# Patient Record
Sex: Male | Born: 1970 | Race: White | Hispanic: No | Marital: Married | Smoking: Never smoker
Health system: Southern US, Community
[De-identification: ages and names within clinical notes are randomized; demographics above are authoritative.]

## PROBLEM LIST (undated history)

## (undated) DIAGNOSIS — S069X9A Unspecified intracranial injury with loss of consciousness of unspecified duration, initial encounter: Secondary | ICD-10-CM

## (undated) DIAGNOSIS — R61 Generalized hyperhidrosis: Secondary | ICD-10-CM

## (undated) DIAGNOSIS — R059 Cough, unspecified: Secondary | ICD-10-CM

## (undated) DIAGNOSIS — R630 Anorexia: Secondary | ICD-10-CM

## (undated) DIAGNOSIS — Q132 Other congenital malformations of iris: Secondary | ICD-10-CM

## (undated) DIAGNOSIS — J45909 Unspecified asthma, uncomplicated: Secondary | ICD-10-CM

## (undated) DIAGNOSIS — K219 Gastro-esophageal reflux disease without esophagitis: Secondary | ICD-10-CM

## (undated) DIAGNOSIS — I10 Essential (primary) hypertension: Secondary | ICD-10-CM

## (undated) DIAGNOSIS — K222 Esophageal obstruction: Secondary | ICD-10-CM

## (undated) DIAGNOSIS — M1990 Unspecified osteoarthritis, unspecified site: Secondary | ICD-10-CM

## (undated) DIAGNOSIS — R7303 Prediabetes: Secondary | ICD-10-CM

## (undated) DIAGNOSIS — M249 Joint derangement, unspecified: Secondary | ICD-10-CM

## (undated) DIAGNOSIS — R5383 Other fatigue: Secondary | ICD-10-CM

## (undated) DIAGNOSIS — G4730 Sleep apnea, unspecified: Secondary | ICD-10-CM

## (undated) DIAGNOSIS — Z972 Presence of dental prosthetic device (complete) (partial): Secondary | ICD-10-CM

## (undated) DIAGNOSIS — R0602 Shortness of breath: Secondary | ICD-10-CM

## (undated) DIAGNOSIS — R05 Cough: Secondary | ICD-10-CM

## (undated) DIAGNOSIS — E785 Hyperlipidemia, unspecified: Secondary | ICD-10-CM

## (undated) DIAGNOSIS — F329 Major depressive disorder, single episode, unspecified: Secondary | ICD-10-CM

## (undated) DIAGNOSIS — F419 Anxiety disorder, unspecified: Secondary | ICD-10-CM

## (undated) DIAGNOSIS — IMO0001 Reserved for inherently not codable concepts without codable children: Secondary | ICD-10-CM

## (undated) DIAGNOSIS — H532 Diplopia: Secondary | ICD-10-CM

## (undated) DIAGNOSIS — K449 Diaphragmatic hernia without obstruction or gangrene: Secondary | ICD-10-CM

## (undated) DIAGNOSIS — F32A Depression, unspecified: Secondary | ICD-10-CM

## (undated) DIAGNOSIS — T7840XA Allergy, unspecified, initial encounter: Secondary | ICD-10-CM

## (undated) DIAGNOSIS — Z5189 Encounter for other specified aftercare: Secondary | ICD-10-CM

## (undated) DIAGNOSIS — D689 Coagulation defect, unspecified: Secondary | ICD-10-CM

## (undated) DIAGNOSIS — M25569 Pain in unspecified knee: Secondary | ICD-10-CM

## (undated) HISTORY — DX: Encounter for other specified aftercare: Z51.89

## (undated) HISTORY — DX: Hyperlipidemia, unspecified: E78.5

## (undated) HISTORY — DX: Presence of dental prosthetic device (complete) (partial): Z97.2

## (undated) HISTORY — PX: ESOPHAGEAL DILATION: SHX303

## (undated) HISTORY — DX: Essential (primary) hypertension: I10

## (undated) HISTORY — DX: Gastro-esophageal reflux disease without esophagitis: K21.9

## (undated) HISTORY — PX: APPENDECTOMY: SHX54

## (undated) HISTORY — DX: Unspecified osteoarthritis, unspecified site: M19.90

## (undated) HISTORY — DX: Cough: R05

## (undated) HISTORY — DX: Allergy, unspecified, initial encounter: T78.40XA

## (undated) HISTORY — PX: EYE SURGERY: SHX253

## (undated) HISTORY — DX: Joint derangement, unspecified: M24.9

## (undated) HISTORY — DX: Esophageal obstruction: K22.2

## (undated) HISTORY — DX: Diplopia: H53.2

## (undated) HISTORY — PX: HERNIA REPAIR: SHX51

## (undated) HISTORY — DX: Pain in unspecified knee: M25.569

## (undated) HISTORY — DX: Depression, unspecified: F32.A

## (undated) HISTORY — PX: TOE SURGERY: SHX1073

## (undated) HISTORY — DX: Major depressive disorder, single episode, unspecified: F32.9

## (undated) HISTORY — PX: JOINT REPLACEMENT: SHX530

## (undated) HISTORY — DX: Anorexia: R63.0

## (undated) HISTORY — DX: Shortness of breath: R06.02

## (undated) HISTORY — DX: Other fatigue: R53.83

## (undated) HISTORY — DX: Coagulation defect, unspecified: D68.9

## (undated) HISTORY — DX: Unspecified intracranial injury with loss of consciousness of unspecified duration, initial encounter: S06.9X9A

## (undated) HISTORY — PX: UPPER GASTROINTESTINAL ENDOSCOPY: SHX188

## (undated) HISTORY — DX: Reserved for inherently not codable concepts without codable children: IMO0001

## (undated) HISTORY — DX: Generalized hyperhidrosis: R61

## (undated) HISTORY — DX: Unspecified asthma, uncomplicated: J45.909

## (undated) HISTORY — DX: Cough, unspecified: R05.9

## (undated) HISTORY — DX: Other congenital malformations of iris: Q13.2

## (undated) HISTORY — DX: Diaphragmatic hernia without obstruction or gangrene: K44.9

## (undated) HISTORY — PX: CHOLECYSTECTOMY: SHX55

---
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
## 2001-02-20 ENCOUNTER — Encounter: Payer: Self-pay | Admitting: Gastroenterology

## 2001-02-20 ENCOUNTER — Ambulatory Visit (HOSPITAL_COMMUNITY): Admission: RE | Admit: 2001-02-20 | Discharge: 2001-02-20 | Payer: Self-pay | Admitting: Gastroenterology

## 2001-02-28 ENCOUNTER — Encounter (INDEPENDENT_AMBULATORY_CARE_PROVIDER_SITE_OTHER): Payer: Self-pay | Admitting: Specialist

## 2001-02-28 ENCOUNTER — Ambulatory Visit (HOSPITAL_COMMUNITY): Admission: RE | Admit: 2001-02-28 | Discharge: 2001-02-28 | Payer: Self-pay | Admitting: Gastroenterology

## 2001-02-28 ENCOUNTER — Encounter: Payer: Self-pay | Admitting: Gastroenterology

## 2002-08-16 ENCOUNTER — Encounter: Admission: RE | Admit: 2002-08-16 | Discharge: 2002-08-16 | Payer: Self-pay | Admitting: Family Medicine

## 2002-08-16 ENCOUNTER — Encounter: Payer: Self-pay | Admitting: Family Medicine

## 2003-07-10 ENCOUNTER — Ambulatory Visit (HOSPITAL_COMMUNITY): Admission: RE | Admit: 2003-07-10 | Discharge: 2003-07-10 | Payer: Self-pay | Admitting: Nurse Practitioner

## 2006-04-29 ENCOUNTER — Emergency Department (HOSPITAL_COMMUNITY): Admission: EM | Admit: 2006-04-29 | Discharge: 2006-04-29 | Payer: Self-pay | Admitting: Emergency Medicine

## 2006-06-27 ENCOUNTER — Ambulatory Visit: Payer: Self-pay | Admitting: Gastroenterology

## 2006-07-06 ENCOUNTER — Ambulatory Visit: Payer: Self-pay | Admitting: Gastroenterology

## 2006-09-26 ENCOUNTER — Encounter: Admission: RE | Admit: 2006-09-26 | Discharge: 2006-09-26 | Payer: Self-pay | Admitting: Family Medicine

## 2007-05-23 ENCOUNTER — Encounter: Admission: RE | Admit: 2007-05-23 | Discharge: 2007-08-21 | Payer: Self-pay | Admitting: Orthopedic Surgery

## 2009-01-29 ENCOUNTER — Emergency Department (HOSPITAL_COMMUNITY): Admission: EM | Admit: 2009-01-29 | Discharge: 2009-01-30 | Payer: Self-pay | Admitting: Emergency Medicine

## 2009-01-30 ENCOUNTER — Telehealth: Payer: Self-pay | Admitting: Gastroenterology

## 2009-01-30 ENCOUNTER — Ambulatory Visit: Payer: Self-pay | Admitting: Gastroenterology

## 2009-02-04 ENCOUNTER — Ambulatory Visit: Payer: Self-pay | Admitting: Gastroenterology

## 2010-08-24 ENCOUNTER — Ambulatory Visit (HOSPITAL_COMMUNITY): Admission: AD | Admit: 2010-08-24 | Discharge: 2010-08-24 | Payer: Self-pay | Admitting: Orthopedic Surgery

## 2010-10-19 ENCOUNTER — Encounter
Admission: RE | Admit: 2010-10-19 | Discharge: 2010-10-19 | Payer: Self-pay | Source: Home / Self Care | Attending: Family Medicine | Admitting: Family Medicine

## 2010-10-22 ENCOUNTER — Ambulatory Visit: Admission: RE | Admit: 2010-10-22 | Payer: Self-pay | Source: Home / Self Care | Admitting: Ophthalmology

## 2010-11-11 LAB — COMPREHENSIVE METABOLIC PANEL
ALT: 25 U/L (ref 0–53)
AST: 24 U/L (ref 0–37)
Albumin: 4.1 g/dL (ref 3.5–5.2)
GFR calc Af Amer: >60 mL/min (ref >60)
GFR calc non Af Amer: >60 mL/min (ref >60)
Glucose, Bld: 90 mg/dL (ref 70–99)
Potassium: 4.1 mEq/L (ref 3.5–5.1)

## 2010-11-11 LAB — SURGICAL PCR SCREEN
MRSA, PCR: NEGATIVE
Staphylococcus aureus: POSITIVE — AB

## 2010-11-11 LAB — DIFFERENTIAL
Basophils Relative: 1 % (ref 0–1)
Eosinophils Absolute: 0 10*3/uL (ref 0.0–0.7)
Lymphs Abs: 2.6 10*3/uL (ref 0.7–4.0)
Monocytes Absolute: 0.5 10*3/uL (ref 0.1–1.0)
Monocytes Relative: 9 % (ref 3–12)
Neutrophils Relative %: 43 % (ref 43–77)

## 2010-11-11 LAB — CBC
HCT: 41.5 % (ref 39.0–52.0)
MCH: 29.3 pg (ref 26.0–34.0)
RDW: 13 % (ref 11.5–15.5)

## 2010-11-15 ENCOUNTER — Ambulatory Visit (HOSPITAL_COMMUNITY)
Admission: RE | Admit: 2010-11-15 | Discharge: 2010-11-15 | Payer: Self-pay | Source: Home / Self Care | Attending: Surgery | Admitting: Surgery

## 2010-11-23 NOTE — Op Note (Signed)
NAME:  Kyle Kennedy, Kyle Kennedy              ACCOUNT NO.:  [id number]  MEDICAL RECORD NO.:  [id number]          PATIENT TYPE:  AMB  LOCATION:  SDS                          FACILITY:  [facility]  PHYSICIAN:  Amia Rynders A. Sahvannah Rieser, M.D.DATE OF BIRTH:  06-18-1971  DATE OF PROCEDURE:  11/15/2010 DATE OF DISCHARGE:  11/15/2010                              OPERATIVE REPORT   PREOPERATIVE DIAGNOSIS:  Symptomatic cholelithiasis.  POSTOPERATIVE DIAGNOSIS:  Acute on chronic cholecystitis.  PROCEDURE:  Laparoscopic cholecystectomy with intraoperative cholangiogram.  SURGEON:  Devika Dragovich A. Jaculin Rasmus, MD  ANESTHESIA:  General endotracheal anesthesia 0.25% Sensorcaine local.  ESTIMATED BLOOD LOSS:  Less than 10 mL.  SPECIMEN:  Gallbladder to Pathology.  ASSISTANT:  Anselm Pancoast. Zachery Dakins, MD  DRAINS:  None.  INDICATIONS FOR PROCEDURE:  The patient is a 40 year old male who has had symptoms of chronic cholelithiasis and biliary colic.  He was seen and evaluated.  I felt laparoscopic cholecystectomy would be beneficial to him.  We discussed the procedure.  We discussed medical options with the pros, cons, and complications of the procedure, which are outlined in my history and physical.  After discussion of the above, he wished to proceed with surgical intervention.  DESCRIPTION OF PROCEDURE:  He was seen in the holding area.  All questions were answered.  He was taken back to the operating room. After induction of general anesthesia, the abdomen was prepped and draped in a sterile fashion.  He had a previous laparotomy, therefore I used a 5-mm OptiVu to gain access into his abdomen.  Under direct vision, a 5-mm OptiVu was placed in the right upper quadrant and was inserted in the abdominal cavity without difficulty.  Insufflation was achieved with 50 mmHg of CO2.  Laparoscope was placed.  We had fairly minimal adhesions in the right upper quadrant.  I placed a second port, which was an 11-mm port in the  subxiphoid position under direct vision. This came just left of the falciform ligament and then we went ahead and put it up through the thin part of the falciform ligament under direct vision.  We inspected for tear and saw no evidence of any injury to the falciform ligament.  I then placed another 2-mm port just left of the umbilicus under direct vision and then the last 5-mm port was placed in the right upper quadrant without difficulty under direct vision. Gallbladder was identified.  It was somewhat bulky in appearance and had some chronic inflammatory changes.  There was a mild acute component as well, but this looked like acute on mostly chronic with a touch of acute cholecystitis on top of this.  There were some mild adhesions from the duodenum and the omentum, which were gently swept away easily. Gallbladder was very bulky looking.  We decompressed with a Nazot.  We then were able to grab the dome and we retracted the patient's right upper quadrant.  Second grasper was used to grab the infundibulum. There was significant edema of the gallbladder, we were able to dissect the cystic duct.  Small incision was made, clips were placed across the junction of the cystic duct into the infundibulum  of the gallbladder. Small incision was made.  Through a separate stab incision, put a [employer], but this would not fit well in the cystic duct.  I went ahead and changed out for a Reddick catheter.  We were able to put that into the cystic duct stump, blow the balloon up and held in place quite nicely.  Intraoperative cholangiogram was performed using half- strength Hypaque dye.  Free flow of contrast on the common hepatic duct down the common bile duct into the duodenum.  Free flow of contrast into the common hepatic duct and right lymphatic ducts without signs of stricture, dilatation, or extravasation.  We removed the catheter. Given the bulkiness of the tissue and the cystic  duct was somewhat dilated, we went ahead and using Endoloops.  I put some clips to hold the catheter in place, removed the catheter, and went ahead and divided the duct.  We placed two Endoloops across the cystic duct stump securing the stump.  I did not see any evidence of leakage of bile from this or other abnormality.  I went ahead and irrigated this out very carefully and again saw no leakage of bile with 2 Endoloops on the cystic duct stump.  At this point, I identified the cystic artery and placed clips across it and divided it.  We then slowly dissected the gallbladder of the gallbladder fossa with good hemostasis.  It was placed in an EndoCatch bag.  We had a little bit of spillage of bile and most of the debris was sludge.  We suctioned all of this out after copious irrigation with a couple liters of saline.  Gallbladder bed was hemostatic, but had a little bit of oozing, therefore I used a small amount of Surgicel snow.  We then removed the gallbladder from the lower abdominal incision and passed off the field.  I closed in direct vision with 0-Vicryl pop-offs.  This was closed satisfactory.  I then did a careful inspection of the abdominal cavity and saw no evidence of injury to the colon, small bowel, liver, stomach, duodenum, and diaphragm. Midline adhesions were noted, but we were well away from this.  I went ahead and suctioned any excess irrigation until it was clear.  There were no signs of any bile leakage or bleeding.  I went ahead and removed our ports after examining the abdominal cavity very carefully, saw no signs of port site bleeding.  Allowed the CO2 to escape.  Skin was closed with 4-0 Monocryl.  Dermabond was applied.  All final counts of sponge, needle, and instruments were found to be correct at this portion of the case.  The patient was extubated, taken to the recovery room in a satisfactory condition.     Duff Pozzi A. Orion Mole, M.D.     TAC/MEDQ  D:   11/15/2010  T:  11/16/2010  Job:  [id number]  cc:   Broadus John T. Pamalee Leyden, MD  Electronically Signed by Harriette Bouillon M.D. on 11/23/2010 06:46:34 PM

## 2010-12-28 LAB — CBC
Hemoglobin: 14 g/dL (ref 13.0–17.0)
MCH: 30.5 pg (ref 26.0–34.0)
MCHC: 36.5 g/dL — ABNORMAL HIGH (ref 30.0–36.0)
MCV: 83.7 fL (ref 78.0–100.0)
RBC: 4.59 MIL/uL (ref 4.22–5.81)
RDW: 12.6 % (ref 11.5–15.5)
WBC: 5.2 10*3/uL (ref 4.0–10.5)

## 2010-12-28 LAB — SURGICAL PCR SCREEN: MRSA, PCR: NEGATIVE

## 2010-12-28 LAB — BASIC METABOLIC PANEL WITH GFR
BUN: 9 mg/dL (ref 6–23)
CO2: 27 meq/L (ref 19–32)
Calcium: 9.6 mg/dL (ref 8.4–10.5)
Chloride: 107 meq/L (ref 96–112)
Creatinine, Ser: 0.89 mg/dL (ref 0.4–1.5)
GFR calc Af Amer: >60 mL/min (ref >60)
GFR calc non Af Amer: >60 mL/min (ref >60)
Glucose, Bld: 102 mg/dL — ABNORMAL HIGH (ref 70–99)
Potassium: 3.9 meq/L (ref 3.5–5.1)
Sodium: 141 meq/L (ref 135–145)

## 2011-01-19 ENCOUNTER — Other Ambulatory Visit: Payer: Self-pay | Admitting: Surgery

## 2011-01-24 ENCOUNTER — Ambulatory Visit
Admission: RE | Admit: 2011-01-24 | Discharge: 2011-01-24 | Disposition: A | Discharge status: Home / Self Care | Payer: Medicare Other | Source: Ambulatory Visit | Attending: Surgery | Admitting: Surgery

## 2011-01-24 MED ORDER — IOHEXOL 300 MG/ML  SOLN
125.0000 mL | Freq: Once | INTRAMUSCULAR | Status: AC | PRN
  Administered 2011-01-24: 125 mL via INTRAVENOUS

## 2011-02-16 ENCOUNTER — Ambulatory Visit: Payer: Medicare Other | Attending: Orthopaedic Surgery | Admitting: Physical Therapy

## 2011-02-16 DIAGNOSIS — M6281 Muscle weakness (generalized): Secondary | ICD-10-CM | POA: Insufficient documentation

## 2011-02-16 DIAGNOSIS — IMO0001 Reserved for inherently not codable concepts without codable children: Secondary | ICD-10-CM | POA: Insufficient documentation

## 2011-02-23 ENCOUNTER — Ambulatory Visit: Payer: Medicare Other | Admitting: Physical Therapy

## 2011-02-25 ENCOUNTER — Ambulatory Visit: Payer: Medicare Other

## 2011-02-28 ENCOUNTER — Ambulatory Visit: Payer: Medicare Other

## 2011-03-02 ENCOUNTER — Ambulatory Visit: Payer: Medicare Other | Admitting: Physical Therapy
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
## 2011-03-08 ENCOUNTER — Ambulatory Visit: Payer: Medicare Other | Admitting: Physical Therapy

## 2011-03-09 ENCOUNTER — Ambulatory Visit: Payer: Medicare Other | Admitting: Physical Therapy

## 2011-03-11 ENCOUNTER — Encounter: Payer: Medicare Other | Admitting: Physical Therapy

## 2011-03-16 ENCOUNTER — Ambulatory Visit: Payer: Medicare Other | Admitting: Physical Therapy

## 2011-03-16 ENCOUNTER — Other Ambulatory Visit (HOSPITAL_BASED_OUTPATIENT_CLINIC_OR_DEPARTMENT_OTHER): Payer: Medicare Other

## 2011-03-16 ENCOUNTER — Encounter (HOSPITAL_BASED_OUTPATIENT_CLINIC_OR_DEPARTMENT_OTHER)
Admission: RE | Admit: 2011-03-16 | Discharge: 2011-03-16 | Disposition: A | Discharge status: Home / Self Care | Payer: Medicare Other | Source: Ambulatory Visit | Attending: Ophthalmology | Admitting: Ophthalmology

## 2011-03-16 LAB — POCT I-STAT, CHEM 8
BUN: 8 mg/dL (ref 6–23)
Chloride: 106 mEq/L (ref 96–112)
Hemoglobin: 15.3 g/dL (ref 13.0–17.0)
Potassium: 4.2 mEq/L (ref 3.5–5.1)
Sodium: 138 mEq/L (ref 135–145)

## 2011-03-18 ENCOUNTER — Ambulatory Visit (HOSPITAL_BASED_OUTPATIENT_CLINIC_OR_DEPARTMENT_OTHER)
Admission: RE | Admit: 2011-03-18 | Discharge: 2011-03-18 | Disposition: A | Discharge status: Home / Self Care | Payer: Medicare Other | Source: Ambulatory Visit | Attending: Ophthalmology | Admitting: Ophthalmology

## 2011-03-18 DIAGNOSIS — H5000 Unspecified esotropia: Secondary | ICD-10-CM | POA: Insufficient documentation

## 2011-03-18 DIAGNOSIS — IMO0002 Reserved for concepts with insufficient information to code with codable children: Secondary | ICD-10-CM | POA: Insufficient documentation

## 2011-03-18 DIAGNOSIS — H4900 Third [oculomotor] nerve palsy, unspecified eye: Secondary | ICD-10-CM | POA: Insufficient documentation

## 2011-03-18 DIAGNOSIS — I10 Essential (primary) hypertension: Secondary | ICD-10-CM | POA: Insufficient documentation

## 2011-03-18 DIAGNOSIS — E669 Obesity, unspecified: Secondary | ICD-10-CM | POA: Insufficient documentation

## 2011-03-18 DIAGNOSIS — F411 Generalized anxiety disorder: Secondary | ICD-10-CM | POA: Insufficient documentation

## 2011-03-18 DIAGNOSIS — F3289 Other specified depressive episodes: Secondary | ICD-10-CM | POA: Insufficient documentation

## 2011-03-18 DIAGNOSIS — F329 Major depressive disorder, single episode, unspecified: Secondary | ICD-10-CM | POA: Insufficient documentation

## 2011-03-22 ENCOUNTER — Encounter: Payer: Medicare Other | Admitting: Physical Therapy

## 2011-03-25 ENCOUNTER — Encounter: Payer: Medicare Other | Admitting: Physical Therapy

## 2011-04-01 ENCOUNTER — Ambulatory Visit (HOSPITAL_COMMUNITY): Payer: Medicare Other

## 2011-04-04 ENCOUNTER — Ambulatory Visit (HOSPITAL_COMMUNITY)
Admission: RE | Admit: 2011-04-04 | Discharge: 2011-04-04 | Disposition: A | Discharge status: Home / Self Care | Payer: Medicare Other | Source: Ambulatory Visit | Attending: Orthopedic Surgery | Admitting: Orthopedic Surgery

## 2011-04-04 DIAGNOSIS — M79609 Pain in unspecified limb: Secondary | ICD-10-CM

## 2011-04-04 DIAGNOSIS — M7989 Other specified soft tissue disorders: Secondary | ICD-10-CM | POA: Insufficient documentation

## 2011-04-08 ENCOUNTER — Encounter: Payer: Self-pay | Admitting: Family Medicine

## 2011-04-08 DIAGNOSIS — M109 Gout, unspecified: Secondary | ICD-10-CM | POA: Insufficient documentation

## 2011-04-08 DIAGNOSIS — F329 Major depressive disorder, single episode, unspecified: Secondary | ICD-10-CM | POA: Insufficient documentation

## 2011-04-08 DIAGNOSIS — S069X9A Unspecified intracranial injury with loss of consciousness of unspecified duration, initial encounter: Secondary | ICD-10-CM | POA: Insufficient documentation

## 2011-04-08 DIAGNOSIS — M1990 Unspecified osteoarthritis, unspecified site: Secondary | ICD-10-CM | POA: Insufficient documentation

## 2011-04-08 DIAGNOSIS — K219 Gastro-esophageal reflux disease without esophagitis: Secondary | ICD-10-CM | POA: Insufficient documentation

## 2011-04-08 DIAGNOSIS — J302 Other seasonal allergic rhinitis: Secondary | ICD-10-CM | POA: Insufficient documentation

## 2011-04-08 DIAGNOSIS — K449 Diaphragmatic hernia without obstruction or gangrene: Secondary | ICD-10-CM | POA: Insufficient documentation

## 2011-04-08 DIAGNOSIS — F32A Depression, unspecified: Secondary | ICD-10-CM | POA: Insufficient documentation

## 2011-04-18 ENCOUNTER — Ambulatory Visit: Payer: Medicare Other | Attending: Orthopedic Surgery

## 2011-04-18 DIAGNOSIS — M6281 Muscle weakness (generalized): Secondary | ICD-10-CM | POA: Insufficient documentation

## 2011-04-18 DIAGNOSIS — IMO0001 Reserved for inherently not codable concepts without codable children: Secondary | ICD-10-CM | POA: Insufficient documentation

## 2011-04-29 ENCOUNTER — Encounter: Payer: Medicare Other | Admitting: Physical Therapy

## 2011-05-02 ENCOUNTER — Ambulatory Visit: Payer: Medicare Other

## 2011-05-04 ENCOUNTER — Ambulatory Visit: Payer: Medicare Other

## 2011-05-05 ENCOUNTER — Encounter (INDEPENDENT_AMBULATORY_CARE_PROVIDER_SITE_OTHER): Payer: Self-pay | Admitting: General Surgery

## 2011-05-06 ENCOUNTER — Ambulatory Visit (INDEPENDENT_AMBULATORY_CARE_PROVIDER_SITE_OTHER): Payer: Medicare Other | Admitting: Surgery

## 2011-05-06 ENCOUNTER — Encounter (INDEPENDENT_AMBULATORY_CARE_PROVIDER_SITE_OTHER): Payer: Self-pay | Admitting: Surgery

## 2011-05-06 DIAGNOSIS — M6200 Separation of muscle (nontraumatic), unspecified site: Secondary | ICD-10-CM

## 2011-05-06 DIAGNOSIS — M6208 Separation of muscle (nontraumatic), other site: Secondary | ICD-10-CM

## 2011-05-06 NOTE — Patient Instructions (Signed)
Will arrange an appointment with Dr Biagio Quint to discuss gastric sleeve procedure.
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
## 2011-05-11 ENCOUNTER — Ambulatory Visit: Payer: Medicare Other

## 2011-05-31 ENCOUNTER — Encounter (INDEPENDENT_AMBULATORY_CARE_PROVIDER_SITE_OTHER): Payer: Self-pay | Admitting: General Surgery

## 2011-05-31 ENCOUNTER — Ambulatory Visit (INDEPENDENT_AMBULATORY_CARE_PROVIDER_SITE_OTHER): Payer: Medicare Other | Admitting: General Surgery

## 2011-05-31 VITALS — BP 124/82 | HR 74 | Temp 97.6°F | Resp 16 | Ht 67.0 in | Wt 249.6 lb

## 2011-05-31 DIAGNOSIS — E669 Obesity, unspecified: Secondary | ICD-10-CM
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
## 2011-06-06 ENCOUNTER — Ambulatory Visit: Payer: Medicare Other | Attending: Orthopedic Surgery

## 2011-06-06 DIAGNOSIS — IMO0001 Reserved for inherently not codable concepts without codable children: Secondary | ICD-10-CM | POA: Insufficient documentation

## 2011-06-06 DIAGNOSIS — M6281 Muscle weakness (generalized): Secondary | ICD-10-CM | POA: Insufficient documentation

## 2011-06-13 ENCOUNTER — Ambulatory Visit: Payer: Medicare Other

## 2011-06-22 ENCOUNTER — Ambulatory Visit: Payer: Medicare Other | Attending: Orthopedic Surgery

## 2011-06-22 DIAGNOSIS — IMO0001 Reserved for inherently not codable concepts without codable children: Secondary | ICD-10-CM | POA: Insufficient documentation

## 2011-06-22 DIAGNOSIS — M6281 Muscle weakness (generalized): Secondary | ICD-10-CM | POA: Insufficient documentation

## 2011-08-19 NOTE — Op Note (Signed)
NAME:  Kyle Kennedy, Kyle Kennedy              ACCOUNT NO.:  [id number]  MEDICAL RECORD NO.:  [id number]           PATIENT TYPE:  O  LOCATION:  [facility]                         FACILITY:  [facility]  PHYSICIAN:  Pasty Spillers. Maple Hudson, M.D. DATE OF BIRTH:  1970-11-25  DATE OF PROCEDURE:  03/18/2011 DATE OF DISCHARGE:                              OPERATIVE REPORT   PREOPERATIVE DIAGNOSES: 1. Left hypotropia. 2. Esotropia. 3. Left third nerve palsy, traumatic.  POSTOPERATIVE DIAGNOSIS: 1. Left hypotropia. 2. Esotropia. 3. Left third nerve palsy, traumatic.  PROCEDURES: 1. Full tendon transposition of left medial rectus muscle and left     lateral rectus muscle to left superior rectus muscle, with Foster     augmentation 2. Left medial rectus muscle recession, 5.0 mm.  SURGEON:  Pasty Spillers. Chanita Boden, MD  ANESTHESIA:  General (laryngeal mask).  COMPLICATIONS:  None.  DESCRIPTION OF PROCEDURE:  After preoperative evaluation including informed consent, the patient was taken to the operating room where he was identified by me.  General anesthesia was induced without difficulty after placement of appropriate monitors.  The patient was prepped and draped in standard sterile fashion.  A lid speculum was placed in the left eye.  Forced traction testing was carried out, indicating no significant limitation to forced elevation of the left eye.  Through a superotemporal fornix incision through conjunctiva and Tenon's fascia, the left lateral rectus muscle was engaged on a series of muscle hooks and cleared of its surrounding fascial attachments and scar tissue.  The muscle was found inserted 14 mm posterior to the limbus.  Its tendon was secured with a double-arm 6-0 Vicryl suture, the double-locking bite at each border of the muscle, 1 mm from the insertion.  The muscle was disinserted.  Through a superonasal fornix incision through conjunctiva and Tenon's fascia, the left medial rectus muscle was engaged on  a series of muscle hooks and cleared of its surrounding fascial attachments and scar tissue.  The muscle was found inserted approximately 5.5 mm posterior to the limbus, but did appear to have had previous surgery, suggesting that was most likely previously resected. The tendon was secured with a double-arm 6-0 Vicryl suture, double- locking bite at each border of the muscle, 1 mm from the insertion.  The muscle was disinserted.  It was reattached to sclera adjacent to the nasal border of the superior rectus muscle, 5 mm posterior to the superior rectus insertion, along the spiral of Tillaux.  A 3 mm posterior to the new insertion (i.e. 8 mm posterior to the superior rectus insertion), the temporal quarter of the medial rectus muscle and the nasal quarter of the superior rectus muscle were joined with a 6-0 Mersilene suture, which was not secured to sclera.  The conjunctival incision was closed with three 6-0 plain gut sutures.  Attention was redirected to the lateral rectus muscle, which was reattached to sclera 7 mm posterior to the superior rectus insertion along its temporal border, with the poles oriented along the parallel to the concentric with the spiral of Tillaux.  The temporal quarter of the superior rectus muscle and the nasal quarter of the lateral  rectus muscle were joined with a 6-0 Mersilene suture, 8 mm posterior to the superior rectus insertion, which was 1 mm posterior to the new insertion of the lateral rectus muscle.  The conjunctival incision was closed with three 6-0 plain gut sutures.  Tobrex ointment was placed in the eye.  The patient was awakened without difficulty and taken to the recovery room in stable condition, having suffered no intraoperative or immediate postop complications.     Pasty Spillers. Maple Hudson, M.D.     Cheron Schaumann  D:  03/18/2011  T:  03/18/2011  Job:  [id number]  Electronically Signed by Verne Carrow M.D. on 08/19/2011 05:35:22 PM

## 2011-11-10 DIAGNOSIS — H4900 Third [oculomotor] nerve palsy, unspecified eye: Secondary | ICD-10-CM | POA: Diagnosis not present

## 2012-01-30 DIAGNOSIS — M1710 Unilateral primary osteoarthritis, unspecified knee: Secondary | ICD-10-CM | POA: Diagnosis not present

## 2012-01-30 DIAGNOSIS — M79609 Pain in unspecified limb: Secondary | ICD-10-CM | POA: Diagnosis not present

## 2012-02-01 DIAGNOSIS — M1710 Unilateral primary osteoarthritis, unspecified knee: Secondary | ICD-10-CM | POA: Diagnosis not present

## 2012-02-29 DIAGNOSIS — E785 Hyperlipidemia, unspecified: Secondary | ICD-10-CM | POA: Diagnosis not present

## 2012-02-29 DIAGNOSIS — I10 Essential (primary) hypertension: Secondary | ICD-10-CM | POA: Diagnosis not present

## 2012-03-01 DIAGNOSIS — E785 Hyperlipidemia, unspecified: Secondary | ICD-10-CM | POA: Diagnosis not present

## 2012-03-01 DIAGNOSIS — I10 Essential (primary) hypertension: Secondary | ICD-10-CM | POA: Diagnosis not present

## 2012-03-01 DIAGNOSIS — M109 Gout, unspecified: Secondary | ICD-10-CM | POA: Diagnosis not present

## 2012-07-26 DIAGNOSIS — Z23 Encounter for immunization: Secondary | ICD-10-CM | POA: Diagnosis not present

## 2012-10-08 DIAGNOSIS — M109 Gout, unspecified: Secondary | ICD-10-CM | POA: Diagnosis not present

## 2012-10-08 DIAGNOSIS — I10 Essential (primary) hypertension: Secondary | ICD-10-CM | POA: Diagnosis not present

## 2012-10-08 DIAGNOSIS — E782 Mixed hyperlipidemia: Secondary | ICD-10-CM | POA: Diagnosis not present

## 2012-10-08 DIAGNOSIS — K2100 Gastro-esophageal reflux disease with esophagitis, without bleeding: Secondary | ICD-10-CM | POA: Diagnosis not present

## 2012-12-07 DIAGNOSIS — B029 Zoster without complications: Secondary | ICD-10-CM | POA: Diagnosis not present

## 2012-12-15 ENCOUNTER — Encounter: Payer: Self-pay | Admitting: *Deleted

## 2012-12-15 ENCOUNTER — Encounter: Payer: Self-pay | Admitting: Physician Assistant

## 2012-12-15 DIAGNOSIS — I10 Essential (primary) hypertension: Secondary | ICD-10-CM | POA: Insufficient documentation

## 2012-12-15 DIAGNOSIS — E785 Hyperlipidemia, unspecified: Secondary | ICD-10-CM | POA: Insufficient documentation

## 2012-12-15 DIAGNOSIS — S04019A Injury of optic nerve, unspecified eye, initial encounter: Secondary | ICD-10-CM | POA: Insufficient documentation

## 2013-01-02 ENCOUNTER — Telehealth: Payer: Self-pay | Admitting: Physician Assistant

## 2013-01-02 MED ORDER — GABAPENTIN 300 MG PO CAPS: 300.0000 mg | ORAL_CAPSULE | Freq: Two times a day (BID) | ORAL | Status: DC

## 2013-01-02 NOTE — Telephone Encounter (Signed)
Medication refilled per protocol.Patient needs to be seen before any further refills 

## 2013-01-07 ENCOUNTER — Telehealth: Payer: Self-pay | Admitting: Family Medicine

## 2013-01-07 DIAGNOSIS — Z87820 Personal history of traumatic brain injury: Secondary | ICD-10-CM

## 2013-01-07 NOTE — Telephone Encounter (Signed)
Chart notes state med refilled 3/19.  Not refilled again today.

## 2013-01-14 ENCOUNTER — Telehealth: Payer: Self-pay | Admitting: Physician Assistant

## 2013-01-14 DIAGNOSIS — M792 Neuralgia and neuritis, unspecified: Secondary | ICD-10-CM

## 2013-01-14 DIAGNOSIS — Z87820 Personal history of traumatic brain injury: Secondary | ICD-10-CM

## 2013-01-14 MED ORDER — GABAPENTIN 600 MG PO TABS: 600.0000 mg | ORAL_TABLET | Freq: Two times a day (BID) | ORAL | Status: DC

## 2013-01-14 NOTE — Telephone Encounter (Signed)
Medication refilled per protocol. 

## 2013-01-16 ENCOUNTER — Telehealth: Payer: Self-pay | Admitting: Family Medicine

## 2013-01-16 MED ORDER — NIACIN ER (ANTIHYPERLIPIDEMIC) 500 MG PO TBCR: 500.0000 mg | EXTENDED_RELEASE_TABLET | Freq: Every day | ORAL | Status: DC

## 2013-01-16 NOTE — Telephone Encounter (Signed)
rx refilled.

## 2013-03-04 ENCOUNTER — Encounter: Payer: Self-pay | Admitting: Physician Assistant

## 2013-03-04 ENCOUNTER — Ambulatory Visit (INDEPENDENT_AMBULATORY_CARE_PROVIDER_SITE_OTHER): Payer: Medicare Other | Admitting: Physician Assistant

## 2013-03-04 VITALS — BP 154/104 | HR 80 | Temp 98.5°F | Resp 20 | Ht 67.75 in | Wt 248.0 lb

## 2013-03-04 DIAGNOSIS — J988 Other specified respiratory disorders: Secondary | ICD-10-CM | POA: Diagnosis not present

## 2013-03-04 DIAGNOSIS — B9689 Other specified bacterial agents as the cause of diseases classified elsewhere: Secondary | ICD-10-CM

## 2013-03-04 DIAGNOSIS — A499 Bacterial infection, unspecified: Secondary | ICD-10-CM

## 2013-03-04 MED ORDER — AZITHROMYCIN 250 MG PO TABS: ORAL_TABLET | ORAL | Status: DC

## 2013-03-04 NOTE — Progress Notes (Signed)
Patient ID: Kyle Kennedy MRN: [id number], DOB: [birth year], 42 y.o. Date of Encounter: 03/04/2013, 12:22 PM    Chief Complaint:  Chief Complaint  Patient presents with  . Cough    x 1 week  mother has had bronchitits     HPI: 42 y.o. year old male reports cough for 3 weeks. Has had minimal mucus from nose. No ear ache. Throat only a little sore sec to cough. No fever.     Home Meds: See attached medication section for any medications that were entered at today's visit. The computer does not put those onto this list.The following list is a list of meds entered prior to today's visit.   Current Outpatient Prescriptions on File Prior to Visit  Medication Sig Dispense Refill  . allopurinol (ZYLOPRIM) 100 MG tablet Take 100 mg by mouth daily.        . benazepril (LOTENSIN) 20 MG tablet Take 20 mg by mouth daily.        Marland Kitchen buPROPion (WELLBUTRIN XL) 150 MG 24 hr tablet Take 150 mg by mouth daily.        Marland Kitchen esomeprazole (NEXIUM) 40 MG capsule Take 40 mg by mouth daily before breakfast.        . gabapentin (NEURONTIN) 600 MG tablet Take 1 tablet (600 mg total) by mouth 2 (two) times daily. DO NOT TAKE ALL AT ONCE  180 tablet  1  . HYDROcodone-acetaminophen (NORCO/VICODIN) 5-325 MG per tablet Take 1 tablet by mouth every 6 (six) hours as needed for pain.      . meloxicam (MOBIC) 15 MG tablet Take 15 mg by mouth daily.      . Multiple Vitamin (MULTI-VITAMIN DAILY PO) Take by mouth.      . niacin (NIASPAN) 500 MG CR tablet Take 1 tablet (500 mg total) by mouth at bedtime.  30 tablet  5  . simvastatin (ZOCOR) 20 MG tablet Take 20 mg by mouth at bedtime.        Marland Kitchen venlafaxine (EFFEXOR) 75 MG tablet Take 75 mg by mouth 2 (two) times daily.        . fexofenadine (ALLEGRA) 180 MG tablet Take 180 mg by mouth daily.         No current facility-administered medications on file prior to visit.    Allergies: No Known Allergies    Review of Systems: See HPI for pertinent ROS. All other ROS negative.     Physical Exam: Blood pressure 144/94, pulse 80, temperature 98.5 F (36.9 C), temperature source Oral, resp. rate 20, height 5' 7.75" (1.721 m), weight 248 lb (112.492 kg)., Body mass index is 37.98 kg/(m^2). General: Overweight WM. Appears in no acute distress. HEENT: Normocephalic, atraumatic, eyes without discharge, sclera non-icteric, nares are without discharge. Bilateral auditory canals clear, TM's are without perforation, pearly grey and translucent with reflective cone of light bilaterally. Oral cavity moist, posterior pharynx without exudate, erythema, peritonsillar abscess, or post nasal drip.  Neck: Supple. No thyromegaly. No lymphadenopathy. Lungs: Clear bilaterally to auscultation without wheezes, rales, or rhonchi. Breathing is unlabored. Heart: Regular rhythm. No murmurs, rubs, or gallops. Msk:  Strength and tone normal for age. Neuro: Alert and oriented X 3. Moves all extremities spontaneously. Gait is normal. CNII-XII grossly in tact.      ASSESSMENT AND PLAN:  42 y.o. year old male with  1. Bacterial respiratory infection - azithromycin (ZITHROMAX) 250 MG tablet; Day 1: Take 2 daily.  Days 2-5: Take 1 daily  Dispense: 6  tablet; Refill: 0 OTC cough suppressants at night. Expectorant during day. F/u if does not resolve.   [address] [place], [place], [facility] 03/04/2013 12:22 PM

## 2013-03-13 ENCOUNTER — Telehealth: Payer: Self-pay | Admitting: Physician Assistant

## 2013-03-14 MED ORDER — LEVOFLOXACIN 750 MG PO TABS: 750.0000 mg | ORAL_TABLET | Freq: Every day | ORAL | Status: DC

## 2013-03-14 NOTE — Telephone Encounter (Signed)
Will give stronger abx. Send RX: Levaquin 750mg  one po QD x 7 days # 7 / 0

## 2013-03-14 NOTE — Telephone Encounter (Signed)
Med called into pharmacy and pt informed

## 2013-03-25 ENCOUNTER — Encounter: Payer: Self-pay | Admitting: Physician Assistant

## 2013-03-25 ENCOUNTER — Ambulatory Visit (INDEPENDENT_AMBULATORY_CARE_PROVIDER_SITE_OTHER): Payer: Medicare Other | Admitting: Physician Assistant

## 2013-03-25 VITALS — BP 132/78 | HR 72 | Temp 98.2°F | Resp 18 | Ht 67.5 in | Wt 248.0 lb

## 2013-03-25 DIAGNOSIS — R059 Cough, unspecified: Secondary | ICD-10-CM | POA: Diagnosis not present

## 2013-03-25 DIAGNOSIS — R05 Cough: Secondary | ICD-10-CM

## 2013-03-25 MED ORDER — AMLODIPINE BESYLATE 5 MG PO TABS: 5.0000 mg | ORAL_TABLET | Freq: Every day | ORAL | Status: DC
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
## 2013-03-28 DIAGNOSIS — IMO0002 Reserved for concepts with insufficient information to code with codable children: Secondary | ICD-10-CM | POA: Diagnosis not present

## 2013-03-28 DIAGNOSIS — M1710 Unilateral primary osteoarthritis, unspecified knee: Secondary | ICD-10-CM | POA: Diagnosis not present

## 2013-04-08 ENCOUNTER — Ambulatory Visit (INDEPENDENT_AMBULATORY_CARE_PROVIDER_SITE_OTHER): Payer: Medicare Other | Admitting: Physician Assistant

## 2013-04-08 ENCOUNTER — Ambulatory Visit
Admission: RE | Admit: 2013-04-08 | Discharge: 2013-04-08 | Disposition: A | Discharge status: Home / Self Care | Payer: Medicare Other | Source: Ambulatory Visit | Attending: Physician Assistant | Admitting: Physician Assistant

## 2013-04-08 ENCOUNTER — Encounter: Payer: Self-pay | Admitting: Physician Assistant

## 2013-04-08 VITALS — BP 156/114 | HR 76 | Temp 98.9°F | Resp 18 | Wt 246.0 lb

## 2013-04-08 DIAGNOSIS — K219 Gastro-esophageal reflux disease without esophagitis: Secondary | ICD-10-CM

## 2013-04-08 DIAGNOSIS — R05 Cough: Secondary | ICD-10-CM

## 2013-04-08 DIAGNOSIS — J309 Allergic rhinitis, unspecified: Secondary | ICD-10-CM

## 2013-04-08 DIAGNOSIS — R059 Cough, unspecified: Secondary | ICD-10-CM | POA: Diagnosis not present

## 2013-04-08 DIAGNOSIS — K449 Diaphragmatic hernia without obstruction or gangrene: Secondary | ICD-10-CM | POA: Diagnosis not present

## 2013-04-08 DIAGNOSIS — I10 Essential (primary) hypertension: Secondary | ICD-10-CM

## 2013-04-08 DIAGNOSIS — R0989 Other specified symptoms and signs involving the circulatory and respiratory systems: Secondary | ICD-10-CM | POA: Diagnosis not present

## 2013-04-08 DIAGNOSIS — Z5189 Encounter for other specified aftercare: Secondary | ICD-10-CM

## 2013-04-08 DIAGNOSIS — S069X0D Unspecified intracranial injury without loss of consciousness, subsequent encounter: Secondary | ICD-10-CM

## 2013-04-08 MED ORDER — ESOMEPRAZOLE MAGNESIUM 40 MG PO CPDR: 40.0000 mg | DELAYED_RELEASE_CAPSULE | Freq: Two times a day (BID) | ORAL | Status: DC

## 2013-04-08 MED ORDER — AMLODIPINE BESYLATE 10 MG PO TABS: 10.0000 mg | ORAL_TABLET | Freq: Every day | ORAL | Status: DC

## 2013-04-09 ENCOUNTER — Telehealth: Payer: Self-pay | Admitting: Family Medicine

## 2013-04-09 DIAGNOSIS — M792 Neuralgia and neuritis, unspecified: Secondary | ICD-10-CM

## 2013-04-09 DIAGNOSIS — Z87820 Personal history of traumatic brain injury: Secondary | ICD-10-CM
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
## 2013-04-10 ENCOUNTER — Ambulatory Visit: Payer: Medicare Other | Admitting: Physician Assistant

## 2013-04-10 MED ORDER — GABAPENTIN 600 MG PO TABS: 600.0000 mg | ORAL_TABLET | Freq: Two times a day (BID) | ORAL | Status: DC

## 2013-04-10 NOTE — Telephone Encounter (Signed)
Rx Refilled  

## 2013-04-18 ENCOUNTER — Other Ambulatory Visit: Payer: Self-pay | Admitting: Physician Assistant

## 2013-04-19 ENCOUNTER — Other Ambulatory Visit: Payer: Self-pay | Admitting: Physician Assistant

## 2013-04-26 ENCOUNTER — Encounter: Payer: Self-pay | Admitting: Family Medicine

## 2013-04-29 ENCOUNTER — Ambulatory Visit: Payer: Medicare Other | Admitting: Physician Assistant

## 2013-05-02 ENCOUNTER — Other Ambulatory Visit: Payer: Self-pay | Admitting: Family Medicine

## 2013-05-02 ENCOUNTER — Telehealth: Payer: Self-pay | Admitting: Family Medicine

## 2013-05-02 NOTE — Telephone Encounter (Signed)
Chart opened my mistake

## 2013-05-03 ENCOUNTER — Ambulatory Visit (INDEPENDENT_AMBULATORY_CARE_PROVIDER_SITE_OTHER): Payer: Medicare Other | Admitting: Family Medicine

## 2013-05-03 ENCOUNTER — Encounter: Payer: Self-pay | Admitting: Family Medicine

## 2013-05-03 VITALS — BP 130/85 | HR 82 | Temp 98.0°F | Ht 68.0 in | Wt 246.0 lb

## 2013-05-03 DIAGNOSIS — S0410XA Injury of oculomotor nerve, unspecified side, initial encounter: Secondary | ICD-10-CM | POA: Insufficient documentation

## 2013-05-03 DIAGNOSIS — S0412XS Injury of oculomotor nerve, left side, sequela: Secondary | ICD-10-CM

## 2013-05-03 DIAGNOSIS — I10 Essential (primary) hypertension: Secondary | ICD-10-CM

## 2013-05-03 DIAGNOSIS — S049XXS Injury of unspecified cranial nerve, sequela: Secondary | ICD-10-CM | POA: Diagnosis not present

## 2013-05-03 DIAGNOSIS — E785 Hyperlipidemia, unspecified: Secondary | ICD-10-CM | POA: Diagnosis not present

## 2013-05-03 NOTE — Assessment & Plan Note (Signed)
Obtained baseline labs for liver fcn and lipids today  1) Consider increasing or switching his statin pending results of lipid panel
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
## 2013-05-03 NOTE — Patient Instructions (Addendum)
It was great seeing you today. Below is a list of the things we talked about:   1) Don't eat before getting your fasting labs  2) Follow-up with me 63mo, unless something comes up   Please check-out at the front desk before leaving the clinic.  I look forward to talking with you again at our next visit. If you have any questions or concerns before then, please call the clinic at [phone].  See you soon,   Dr Wenda Low

## 2013-05-03 NOTE — Assessment & Plan Note (Signed)
Will obtain baseline labs today for renal fcn monitoring  1) BP wnl today 2) Consider adding back ACE in future if needed, as not likely the cause of his cough

## 2013-05-21 ENCOUNTER — Telehealth: Payer: Self-pay | Admitting: Family Medicine

## 2013-05-21 ENCOUNTER — Other Ambulatory Visit: Payer: Self-pay | Admitting: Physician Assistant

## 2013-05-21 NOTE — Telephone Encounter (Signed)
Medication refilled per protocol. 

## 2013-05-22 NOTE — Telephone Encounter (Signed)
Pt transferred from our practice

## 2013-05-27 ENCOUNTER — Other Ambulatory Visit: Payer: Self-pay | Admitting: Physician Assistant

## 2013-06-26 ENCOUNTER — Encounter: Payer: Self-pay | Admitting: Family Medicine

## 2013-06-27 ENCOUNTER — Other Ambulatory Visit: Payer: Self-pay | Admitting: Family Medicine

## 2013-07-09 DIAGNOSIS — IMO0002 Reserved for concepts with insufficient information to code with codable children: Secondary | ICD-10-CM | POA: Diagnosis not present

## 2013-07-09 DIAGNOSIS — M1710 Unilateral primary osteoarthritis, unspecified knee: Secondary | ICD-10-CM | POA: Diagnosis not present

## 2013-07-14 ENCOUNTER — Encounter: Payer: Self-pay | Admitting: Family Medicine

## 2013-07-15 ENCOUNTER — Other Ambulatory Visit: Payer: Self-pay | Admitting: Family Medicine

## 2013-07-15 DIAGNOSIS — K219 Gastro-esophageal reflux disease without esophagitis: Secondary | ICD-10-CM

## 2013-07-17 ENCOUNTER — Ambulatory Visit: Payer: Self-pay | Admitting: Anesthesiology

## 2013-07-17 DIAGNOSIS — IMO0002 Reserved for concepts with insufficient information to code with codable children: Secondary | ICD-10-CM | POA: Diagnosis not present

## 2013-07-17 DIAGNOSIS — I10 Essential (primary) hypertension: Secondary | ICD-10-CM | POA: Diagnosis not present

## 2013-07-17 DIAGNOSIS — Z01810 Encounter for preprocedural cardiovascular examination: Secondary | ICD-10-CM | POA: Diagnosis not present

## 2013-07-17 DIAGNOSIS — M25569 Pain in unspecified knee: Secondary | ICD-10-CM | POA: Diagnosis not present

## 2013-07-17 DIAGNOSIS — M1710 Unilateral primary osteoarthritis, unspecified knee: Secondary | ICD-10-CM | POA: Diagnosis not present

## 2013-07-24 ENCOUNTER — Other Ambulatory Visit: Payer: Self-pay | Admitting: Family Medicine

## 2013-07-26 ENCOUNTER — Ambulatory Visit: Payer: Self-pay | Admitting: General Practice

## 2013-07-26 DIAGNOSIS — Z9089 Acquired absence of other organs: Secondary | ICD-10-CM | POA: Diagnosis not present

## 2013-07-26 DIAGNOSIS — IMO0002 Reserved for concepts with insufficient information to code with codable children: Secondary | ICD-10-CM | POA: Diagnosis not present

## 2013-07-26 DIAGNOSIS — Z8261 Family history of arthritis: Secondary | ICD-10-CM | POA: Diagnosis not present

## 2013-07-26 DIAGNOSIS — E669 Obesity, unspecified: Secondary | ICD-10-CM | POA: Diagnosis not present

## 2013-07-26 DIAGNOSIS — M1710 Unilateral primary osteoarthritis, unspecified knee: Secondary | ICD-10-CM | POA: Diagnosis not present

## 2013-07-26 DIAGNOSIS — Z09 Encounter for follow-up examination after completed treatment for conditions other than malignant neoplasm: Secondary | ICD-10-CM | POA: Diagnosis not present

## 2013-07-26 DIAGNOSIS — S8290XD Unspecified fracture of unspecified lower leg, subsequent encounter for closed fracture with routine healing: Secondary | ICD-10-CM | POA: Diagnosis not present

## 2013-07-26 DIAGNOSIS — K219 Gastro-esophageal reflux disease without esophagitis: Secondary | ICD-10-CM | POA: Diagnosis not present

## 2013-07-26 DIAGNOSIS — M109 Gout, unspecified: Secondary | ICD-10-CM | POA: Diagnosis not present

## 2013-07-26 DIAGNOSIS — Z79899 Other long term (current) drug therapy: Secondary | ICD-10-CM | POA: Diagnosis not present

## 2013-07-26 DIAGNOSIS — Z8249 Family history of ischemic heart disease and other diseases of the circulatory system: Secondary | ICD-10-CM | POA: Diagnosis not present

## 2013-07-26 DIAGNOSIS — M545 Low back pain, unspecified: Secondary | ICD-10-CM | POA: Diagnosis not present

## 2013-07-26 DIAGNOSIS — Z472 Encounter for removal of internal fixation device: Secondary | ICD-10-CM | POA: Diagnosis not present

## 2013-07-26 DIAGNOSIS — I10 Essential (primary) hypertension: Secondary | ICD-10-CM | POA: Diagnosis not present

## 2013-07-26 DIAGNOSIS — Z8262 Family history of osteoporosis: Secondary | ICD-10-CM | POA: Diagnosis not present

## 2013-07-26 DIAGNOSIS — E785 Hyperlipidemia, unspecified: Secondary | ICD-10-CM | POA: Diagnosis not present

## 2013-07-26 DIAGNOSIS — Z6836 Body mass index (BMI) 36.0-36.9, adult: Secondary | ICD-10-CM | POA: Diagnosis not present

## 2013-08-02 DIAGNOSIS — IMO0002 Reserved for concepts with insufficient information to code with codable children: Secondary | ICD-10-CM | POA: Diagnosis not present

## 2013-08-02 DIAGNOSIS — M1710 Unilateral primary osteoarthritis, unspecified knee: Secondary | ICD-10-CM | POA: Diagnosis not present

## 2013-08-07 ENCOUNTER — Ambulatory Visit (INDEPENDENT_AMBULATORY_CARE_PROVIDER_SITE_OTHER): Payer: Medicare Other | Admitting: *Deleted

## 2013-08-07 DIAGNOSIS — Z23 Encounter for immunization: Secondary | ICD-10-CM

## 2013-08-18 ENCOUNTER — Other Ambulatory Visit: Payer: Self-pay | Admitting: Physician Assistant

## 2013-08-19 NOTE — Telephone Encounter (Signed)
Pt has transferred.  Refill denied

## 2013-09-03 DIAGNOSIS — M1710 Unilateral primary osteoarthritis, unspecified knee: Secondary | ICD-10-CM | POA: Diagnosis not present

## 2013-09-03 DIAGNOSIS — IMO0002 Reserved for concepts with insufficient information to code with codable children: Secondary | ICD-10-CM | POA: Diagnosis not present

## 2013-09-06 ENCOUNTER — Other Ambulatory Visit: Payer: Self-pay | Admitting: Physician Assistant

## 2013-09-16 ENCOUNTER — Encounter: Payer: Self-pay | Admitting: Gastroenterology

## 2013-10-01 ENCOUNTER — Ambulatory Visit (INDEPENDENT_AMBULATORY_CARE_PROVIDER_SITE_OTHER): Payer: Medicare Other | Admitting: Family Medicine

## 2013-10-01 ENCOUNTER — Encounter: Payer: Self-pay | Admitting: Family Medicine

## 2013-10-01 VITALS — BP 145/95 | HR 84 | Temp 97.5°F | Wt 255.0 lb

## 2013-10-01 DIAGNOSIS — T7840XA Allergy, unspecified, initial encounter: Secondary | ICD-10-CM | POA: Diagnosis not present

## 2013-10-01 DIAGNOSIS — E785 Hyperlipidemia, unspecified: Secondary | ICD-10-CM | POA: Diagnosis not present

## 2013-10-01 DIAGNOSIS — F329 Major depressive disorder, single episode, unspecified: Secondary | ICD-10-CM

## 2013-10-01 DIAGNOSIS — F3289 Other specified depressive episodes: Secondary | ICD-10-CM | POA: Diagnosis not present

## 2013-10-01 DIAGNOSIS — I10 Essential (primary) hypertension: Secondary | ICD-10-CM

## 2013-10-01 DIAGNOSIS — S049XXS Injury of unspecified cranial nerve, sequela: Secondary | ICD-10-CM | POA: Diagnosis not present

## 2013-10-01 MED ORDER — LISINOPRIL 10 MG PO TABS: 10.0000 mg | ORAL_TABLET | Freq: Every day | ORAL | Status: DC

## 2013-10-01 MED ORDER — FLUTICASONE PROPIONATE 50 MCG/ACT NA SUSP: 2.0000 | Freq: Every day | NASAL | Status: DC

## 2013-10-01 MED ORDER — LEVOCETIRIZINE DIHYDROCHLORIDE 5 MG PO TABS: 5.0000 mg | ORAL_TABLET | Freq: Every evening | ORAL | Status: DC

## 2013-10-01 NOTE — Assessment & Plan Note (Signed)
Seasonal during winter-time; Currently on Zyrtec OTC due to cost - Rx for Xyzal given (preferred medicare option) and Flonase - Advised smoking (marijuana) cessation

## 2013-10-01 NOTE — Patient Instructions (Signed)
It was great seeing you today.   1. Start Lisinopril 10 mg (1 pill) daily 2. Start Flonase and antihistamine for seasonal allergies    Please bring all your medications to every doctors visit  Sign up for My Chart to have easy access to your labs results, and communication with your Primary care physician.  Next Appointment  Nurse appointment for BP check  Call to schedule PCP appointment in 6 months   I look forward to talking with you again at our next visit. If you have any questions or concerns before then, please call the clinic at [phone].  Take Care,   Dr Wenda Low

## 2013-10-01 NOTE — Assessment & Plan Note (Signed)
Assess Lipid today - Discussed continued need for niacin or need to switch to Lipitor depending on CVD risk assessment - Check LFTs

## 2013-10-01 NOTE — Assessment & Plan Note (Signed)
BP elevated today; ACE-I stopped previously for cough (though he had been on this for many years w/o cough) - Restart Lisinopril today (cough resolved with bronchitis treatment) - Nurse visit for BP check in 1 month - Check CMET

## 2013-10-01 NOTE — Progress Notes (Signed)
  Patient name: Kyle Kennedy MRN [id number]  Date of birth: [birth year]  CC & HPI:  Kyle Kennedy is a 42 y.o. male presenting today for hypertension and allergies  CHRONIC HYPERTENSION BP Readings from Last 3 Encounters:  10/01/13 145/95  05/03/13 130/85  04/08/13 156/114    Disease Monitoring  Chest pain: no   Dyspnea: no   Claudication: no  Medication compliance/financial difficulties: yes  Medication Side Effects: None Preventitive Healthcare:  Smoking: Marijuana    Exercise: Limited by knee pain (surgery schedule in 1 month)    Allergies - Nasal congestion and rhinorrhea during winter-time. Recently started Zyrtec OTC with some improvement; occasional dry cough  ROS: See HPI above otherwise negative.  Medical & Surgical Hx:  Reviewed.  Medications & Allergies: Reviewed & Updated - see associated section Social History: Reviewed:   Objective Findings:  Vitals: BP 145/95  Pulse 84  Temp(Src) 97.5 F (36.4 C) (Oral)  Wt 255 lb (115.667 kg)  Gen: NAD Eyes:Sclera white; Conjunctiva pink; PERRLA; EOMI;  Nose: Mucosa pink; Nasal turbinate erythematous and swollen; No sinus tenderness Throat: Oral mucosa pink, Pharynx w/o exudates CV: RRR w/o m/r/g, pulses +2 b/l Resp: CTAB w/ normal respiratory effort Lower Ext: No skin changes; No edema  Assessment & Plan:   Please See Problem Focused Assessment & Plan

## 2013-10-01 NOTE — Assessment & Plan Note (Signed)
-   Currently treated with Effexor and wellbutrin - Discussed: Effexor might be contributing to HTN - Pt wishes to continue Effexor for now (has crying spells and becomes "Dr Evern Core" when he misses doses - Consider switching to SSRI in future in HTN persist

## 2013-10-04 ENCOUNTER — Encounter: Payer: Self-pay | Admitting: Family Medicine

## 2013-10-05 ENCOUNTER — Other Ambulatory Visit: Payer: Self-pay | Admitting: Family Medicine

## 2013-10-15 ENCOUNTER — Other Ambulatory Visit: Payer: Medicare Other

## 2013-10-15 ENCOUNTER — Ambulatory Visit (INDEPENDENT_AMBULATORY_CARE_PROVIDER_SITE_OTHER): Payer: Medicare Other | Admitting: *Deleted

## 2013-10-15 VITALS — BP 134/88 | HR 98

## 2013-10-15 DIAGNOSIS — I10 Essential (primary) hypertension: Secondary | ICD-10-CM

## 2013-10-15 LAB — LIPID PANEL
Cholesterol: 193 mg/dL (ref 0–200)
HDL: 39 mg/dL — ABNORMAL LOW (ref >39)
Triglycerides: 462 mg/dL — ABNORMAL HIGH (ref <150)

## 2013-10-15 LAB — COMPREHENSIVE METABOLIC PANEL
AST: 23 U/L (ref 0–37)
Alkaline Phosphatase: 107 U/L (ref 39–117)
BUN: 10 mg/dL (ref 6–23)
CO2: 26 mEq/L (ref 19–32)
Chloride: 103 mEq/L (ref 96–112)
Glucose, Bld: 143 mg/dL — ABNORMAL HIGH (ref 70–99)
Potassium: 3.6 mEq/L (ref 3.5–5.3)
Sodium: 140 mEq/L (ref 135–145)
Total Bilirubin: 0.4 mg/dL (ref 0.3–1.2)
Total Protein: 7.1 g/dL (ref 6.0–8.3)

## 2013-10-15 NOTE — Progress Notes (Signed)
CMP AND FLP DONE TODAY [employer] 

## 2013-10-15 NOTE — Progress Notes (Signed)
Patient in today for blood pressure check. Blood pressure taken in right arm with large cuff was 134/88, pulse 98. Patient reports regular taking of lisinopril. Will forward note to PCP.

## 2013-10-16 ENCOUNTER — Ambulatory Visit: Payer: Self-pay | Admitting: General Practice

## 2013-10-16 DIAGNOSIS — IMO0002 Reserved for concepts with insufficient information to code with codable children: Secondary | ICD-10-CM | POA: Diagnosis not present

## 2013-10-16 DIAGNOSIS — Z79899 Other long term (current) drug therapy: Secondary | ICD-10-CM | POA: Diagnosis not present

## 2013-10-16 DIAGNOSIS — Z01812 Encounter for preprocedural laboratory examination: Secondary | ICD-10-CM | POA: Diagnosis not present

## 2013-10-16 DIAGNOSIS — M79609 Pain in unspecified limb: Secondary | ICD-10-CM | POA: Diagnosis not present

## 2013-10-16 LAB — CBC
HCT: 40.6 % (ref 40.0–52.0)
HGB: 14.5 g/dL (ref 13.0–18.0)
MCHC: 35.6 g/dL (ref 32.0–36.0)
WBC: 7.9 10*3/uL (ref 3.8–10.6)

## 2013-10-16 LAB — URINALYSIS, COMPLETE
Ketone: NEGATIVE
Protein: NEGATIVE
RBC,UR: 1 /HPF (ref 0–5)
Squamous Epithelial: NONE SEEN

## 2013-10-16 LAB — BASIC METABOLIC PANEL
Anion Gap: 10 (ref 7–16)
BUN: 7 mg/dL (ref 7–18)
Calcium, Total: 9.5 mg/dL (ref 8.5–10.1)
Chloride: 104 mmol/L (ref 98–107)
Co2: 28 mmol/L (ref 21–32)
Creatinine: 0.85 mg/dL (ref 0.60–1.30)
EGFR (Non-African Amer.): >60
Glucose: 83 mg/dL (ref 65–99)
Potassium: 3.7 mmol/L (ref 3.5–5.1)
Sodium: 142 mmol/L (ref 136–145)

## 2013-10-16 LAB — PROTIME-INR
INR: 1
Prothrombin Time: 12.6 secs (ref 11.5–14.7)

## 2013-10-16 LAB — APTT: Activated PTT: 33.3 secs (ref 23.6–35.9)

## 2013-10-16 LAB — MRSA PCR SCREENING

## 2013-10-17 HISTORY — PX: REPLACEMENT TOTAL KNEE: SUR1224

## 2013-10-18 LAB — URINE CULTURE

## 2013-10-22 DIAGNOSIS — M1710 Unilateral primary osteoarthritis, unspecified knee: Secondary | ICD-10-CM | POA: Diagnosis not present

## 2013-10-22 DIAGNOSIS — IMO0002 Reserved for concepts with insufficient information to code with codable children: Secondary | ICD-10-CM | POA: Diagnosis not present

## 2013-10-28 ENCOUNTER — Inpatient Hospital Stay: Payer: Self-pay | Admitting: General Practice

## 2013-10-28 DIAGNOSIS — M109 Gout, unspecified: Secondary | ICD-10-CM | POA: Diagnosis present

## 2013-10-28 DIAGNOSIS — D62 Acute posthemorrhagic anemia: Secondary | ICD-10-CM | POA: Diagnosis present

## 2013-10-28 DIAGNOSIS — E785 Hyperlipidemia, unspecified: Secondary | ICD-10-CM | POA: Diagnosis present

## 2013-10-28 DIAGNOSIS — Z9889 Other specified postprocedural states: Secondary | ICD-10-CM | POA: Diagnosis not present

## 2013-10-28 DIAGNOSIS — Z96659 Presence of unspecified artificial knee joint: Secondary | ICD-10-CM | POA: Diagnosis not present

## 2013-10-28 DIAGNOSIS — M1710 Unilateral primary osteoarthritis, unspecified knee: Secondary | ICD-10-CM | POA: Diagnosis present

## 2013-10-28 DIAGNOSIS — Z8249 Family history of ischemic heart disease and other diseases of the circulatory system: Secondary | ICD-10-CM | POA: Diagnosis not present

## 2013-10-28 DIAGNOSIS — Z87828 Personal history of other (healed) physical injury and trauma: Secondary | ICD-10-CM | POA: Diagnosis not present

## 2013-10-28 DIAGNOSIS — IMO0002 Reserved for concepts with insufficient information to code with codable children: Secondary | ICD-10-CM | POA: Diagnosis not present

## 2013-10-28 DIAGNOSIS — Z9089 Acquired absence of other organs: Secondary | ICD-10-CM | POA: Diagnosis not present

## 2013-10-28 DIAGNOSIS — Z7982 Long term (current) use of aspirin: Secondary | ICD-10-CM | POA: Diagnosis not present

## 2013-10-28 DIAGNOSIS — H547 Unspecified visual loss: Secondary | ICD-10-CM | POA: Diagnosis present

## 2013-10-28 DIAGNOSIS — I10 Essential (primary) hypertension: Secondary | ICD-10-CM | POA: Diagnosis present

## 2013-10-28 DIAGNOSIS — Z79899 Other long term (current) drug therapy: Secondary | ICD-10-CM | POA: Diagnosis not present

## 2013-10-29 LAB — BASIC METABOLIC PANEL
ANION GAP: 5 — AB (ref 7–16)
BUN: 9 mg/dL (ref 7–18)
CHLORIDE: 107 mmol/L (ref 98–107)
CO2: 26 mmol/L (ref 21–32)
Calcium, Total: 8.6 mg/dL (ref 8.5–10.1)
Creatinine: 0.86 mg/dL (ref 0.60–1.30)
EGFR (African American): >60
Glucose: 97 mg/dL (ref 65–99)
OSMOLALITY: 274 (ref 275–301)
Potassium: 3.7 mmol/L (ref 3.5–5.1)
SODIUM: 138 mmol/L (ref 136–145)

## 2013-10-29 LAB — PLATELET COUNT: PLATELETS: 218 10*3/uL (ref 150–440)

## 2013-10-29 LAB — HEMOGLOBIN: HGB: 11.7 g/dL — ABNORMAL LOW (ref 13.0–18.0)

## 2013-10-30 LAB — BASIC METABOLIC PANEL
ANION GAP: 4 — AB (ref 7–16)
BUN: 9 mg/dL (ref 7–18)
Calcium, Total: 8.7 mg/dL (ref 8.5–10.1)
Chloride: 107 mmol/L (ref 98–107)
Co2: 29 mmol/L (ref 21–32)
Creatinine: 0.92 mg/dL (ref 0.60–1.30)
Glucose: 126 mg/dL — ABNORMAL HIGH (ref 65–99)
Osmolality: 280 (ref 275–301)
Potassium: 3.9 mmol/L (ref 3.5–5.1)
SODIUM: 140 mmol/L (ref 136–145)

## 2013-10-30 LAB — HEMOGLOBIN: HGB: 11.1 g/dL — AB (ref 13.0–18.0)

## 2013-10-30 LAB — PLATELET COUNT: Platelet: 201 10*3/uL (ref 150–440)

## 2013-11-01 ENCOUNTER — Encounter: Payer: Self-pay | Admitting: Family Medicine

## 2013-11-02 DIAGNOSIS — I10 Essential (primary) hypertension: Secondary | ICD-10-CM | POA: Diagnosis not present

## 2013-11-02 DIAGNOSIS — E785 Hyperlipidemia, unspecified: Secondary | ICD-10-CM | POA: Diagnosis not present

## 2013-11-02 DIAGNOSIS — IMO0001 Reserved for inherently not codable concepts without codable children: Secondary | ICD-10-CM | POA: Diagnosis not present

## 2013-11-02 DIAGNOSIS — Z96659 Presence of unspecified artificial knee joint: Secondary | ICD-10-CM | POA: Diagnosis not present

## 2013-11-02 DIAGNOSIS — Z7901 Long term (current) use of anticoagulants: Secondary | ICD-10-CM | POA: Diagnosis not present

## 2013-11-02 DIAGNOSIS — Z471 Aftercare following joint replacement surgery: Secondary | ICD-10-CM | POA: Diagnosis not present

## 2013-11-04 DIAGNOSIS — E785 Hyperlipidemia, unspecified: Secondary | ICD-10-CM | POA: Diagnosis not present

## 2013-11-04 DIAGNOSIS — Z471 Aftercare following joint replacement surgery: Secondary | ICD-10-CM | POA: Diagnosis not present

## 2013-11-04 DIAGNOSIS — IMO0001 Reserved for inherently not codable concepts without codable children: Secondary | ICD-10-CM | POA: Diagnosis not present

## 2013-11-04 DIAGNOSIS — Z96659 Presence of unspecified artificial knee joint: Secondary | ICD-10-CM | POA: Diagnosis not present

## 2013-11-04 DIAGNOSIS — I10 Essential (primary) hypertension: Secondary | ICD-10-CM | POA: Diagnosis not present

## 2013-11-04 DIAGNOSIS — Z7901 Long term (current) use of anticoagulants: Secondary | ICD-10-CM | POA: Diagnosis not present

## 2013-11-05 DIAGNOSIS — Z96659 Presence of unspecified artificial knee joint: Secondary | ICD-10-CM | POA: Diagnosis not present

## 2013-11-05 DIAGNOSIS — Z471 Aftercare following joint replacement surgery: Secondary | ICD-10-CM | POA: Diagnosis not present

## 2013-11-05 DIAGNOSIS — Z7901 Long term (current) use of anticoagulants: Secondary | ICD-10-CM | POA: Diagnosis not present

## 2013-11-05 DIAGNOSIS — E785 Hyperlipidemia, unspecified: Secondary | ICD-10-CM | POA: Diagnosis not present

## 2013-11-05 DIAGNOSIS — I10 Essential (primary) hypertension: Secondary | ICD-10-CM | POA: Diagnosis not present

## 2013-11-05 DIAGNOSIS — IMO0001 Reserved for inherently not codable concepts without codable children: Secondary | ICD-10-CM | POA: Diagnosis not present

## 2013-11-07 DIAGNOSIS — Z471 Aftercare following joint replacement surgery: Secondary | ICD-10-CM | POA: Diagnosis not present

## 2013-11-07 DIAGNOSIS — Z7901 Long term (current) use of anticoagulants: Secondary | ICD-10-CM | POA: Diagnosis not present

## 2013-11-07 DIAGNOSIS — IMO0001 Reserved for inherently not codable concepts without codable children: Secondary | ICD-10-CM | POA: Diagnosis not present

## 2013-11-07 DIAGNOSIS — I10 Essential (primary) hypertension: Secondary | ICD-10-CM | POA: Diagnosis not present

## 2013-11-07 DIAGNOSIS — E785 Hyperlipidemia, unspecified: Secondary | ICD-10-CM | POA: Diagnosis not present

## 2013-11-07 DIAGNOSIS — Z96659 Presence of unspecified artificial knee joint: Secondary | ICD-10-CM | POA: Diagnosis not present

## 2013-11-08 DIAGNOSIS — I10 Essential (primary) hypertension: Secondary | ICD-10-CM | POA: Diagnosis not present

## 2013-11-08 DIAGNOSIS — Z7901 Long term (current) use of anticoagulants: Secondary | ICD-10-CM | POA: Diagnosis not present

## 2013-11-08 DIAGNOSIS — Z96659 Presence of unspecified artificial knee joint: Secondary | ICD-10-CM | POA: Diagnosis not present

## 2013-11-08 DIAGNOSIS — Z471 Aftercare following joint replacement surgery: Secondary | ICD-10-CM | POA: Diagnosis not present

## 2013-11-08 DIAGNOSIS — IMO0001 Reserved for inherently not codable concepts without codable children: Secondary | ICD-10-CM | POA: Diagnosis not present

## 2013-11-08 DIAGNOSIS — E785 Hyperlipidemia, unspecified: Secondary | ICD-10-CM | POA: Diagnosis not present

## 2013-11-11 DIAGNOSIS — Z96659 Presence of unspecified artificial knee joint: Secondary | ICD-10-CM | POA: Diagnosis not present

## 2013-11-11 DIAGNOSIS — Z471 Aftercare following joint replacement surgery: Secondary | ICD-10-CM | POA: Diagnosis not present

## 2013-11-11 DIAGNOSIS — I10 Essential (primary) hypertension: Secondary | ICD-10-CM | POA: Diagnosis not present

## 2013-11-11 DIAGNOSIS — Z7901 Long term (current) use of anticoagulants: Secondary | ICD-10-CM | POA: Diagnosis not present

## 2013-11-11 DIAGNOSIS — IMO0001 Reserved for inherently not codable concepts without codable children: Secondary | ICD-10-CM | POA: Diagnosis not present

## 2013-11-11 DIAGNOSIS — E785 Hyperlipidemia, unspecified: Secondary | ICD-10-CM | POA: Diagnosis not present

## 2013-11-13 DIAGNOSIS — IMO0001 Reserved for inherently not codable concepts without codable children: Secondary | ICD-10-CM | POA: Diagnosis not present

## 2013-11-13 DIAGNOSIS — Z7901 Long term (current) use of anticoagulants: Secondary | ICD-10-CM | POA: Diagnosis not present

## 2013-11-13 DIAGNOSIS — I10 Essential (primary) hypertension: Secondary | ICD-10-CM | POA: Diagnosis not present

## 2013-11-13 DIAGNOSIS — E785 Hyperlipidemia, unspecified: Secondary | ICD-10-CM | POA: Diagnosis not present

## 2013-11-13 DIAGNOSIS — Z96659 Presence of unspecified artificial knee joint: Secondary | ICD-10-CM | POA: Diagnosis not present

## 2013-11-13 DIAGNOSIS — Z471 Aftercare following joint replacement surgery: Secondary | ICD-10-CM | POA: Diagnosis not present

## 2013-11-15 DIAGNOSIS — Z471 Aftercare following joint replacement surgery: Secondary | ICD-10-CM | POA: Diagnosis not present

## 2013-11-15 DIAGNOSIS — IMO0001 Reserved for inherently not codable concepts without codable children: Secondary | ICD-10-CM | POA: Diagnosis not present

## 2013-11-15 DIAGNOSIS — I10 Essential (primary) hypertension: Secondary | ICD-10-CM | POA: Diagnosis not present

## 2013-11-15 DIAGNOSIS — Z7901 Long term (current) use of anticoagulants: Secondary | ICD-10-CM | POA: Diagnosis not present

## 2013-11-15 DIAGNOSIS — Z96659 Presence of unspecified artificial knee joint: Secondary | ICD-10-CM | POA: Diagnosis not present

## 2013-11-15 DIAGNOSIS — E785 Hyperlipidemia, unspecified: Secondary | ICD-10-CM | POA: Diagnosis not present

## 2013-11-18 ENCOUNTER — Encounter: Payer: Self-pay | Admitting: Gastroenterology

## 2013-11-18 ENCOUNTER — Ambulatory Visit (INDEPENDENT_AMBULATORY_CARE_PROVIDER_SITE_OTHER): Payer: Medicare Other | Admitting: Gastroenterology

## 2013-11-18 VITALS — BP 124/90 | HR 80 | Ht 67.5 in | Wt 251.0 lb

## 2013-11-18 DIAGNOSIS — R1319 Other dysphagia: Secondary | ICD-10-CM | POA: Diagnosis not present

## 2013-11-18 DIAGNOSIS — K219 Gastro-esophageal reflux disease without esophagitis: Secondary | ICD-10-CM

## 2013-11-18 NOTE — Patient Instructions (Signed)
You have been scheduled for an endoscopy with propofol. Please follow written instructions given to you at your visit today. If you use inhalers (even only as needed), please bring them with you on the day of your procedure.   Thank you for choosing me and [facility].  Malcolm T. Stark, Jr., MD., FACG  

## 2013-11-18 NOTE — Progress Notes (Signed)
    History of Present Illness: This is a 43 year old male with worsening solid food dysphagia over the past several months. He has undergone endoscopies in 2007 and 2010 for distal esophageal stricture dilation. He's generally been maintained on Nexium 40 mg daily however this was recently increased to twice daily. Denies weight loss, abdominal pain, constipation, diarrhea, change in stool caliber, melena, hematochezia, nausea, vomiting, chest pain.  Review of Systems: Pertinent positive and negative review of systems were noted in the above HPI section. All other review of systems were otherwise negative.  Current Medications, Allergies, Past Medical History, Past Surgical History, Family History and Social History were reviewed in [facility] electronic medical record.  Physical Exam: General: Well developed , well nourished, no acute distress Head: Normocephalic and atraumatic Eyes:  sclerae anicteric, EOMI Ears: Normal auditory acuity Mouth: No deformity or lesions Neck: Supple, no masses or thyromegaly Lungs: Clear throughout to auscultation Heart: Regular rate and rhythm; no murmurs, rubs or bruits Abdomen: Soft, non tender and non distended. No masses, hepatosplenomegaly or hernias noted. Normal Bowel sounds Musculoskeletal: Symmetrical with no gross deformities  Skin: No lesions on visible extremities Pulses:  Normal pulses noted Extremities: No clubbing, cyanosis, edema or deformities noted Neurological: Alert oriented x 4, grossly nonfocal Cervical Nodes:  No significant cervical adenopathy Inguinal Nodes: No significant inguinal adenopathy Psychological:  Alert and cooperative. Normal mood and affect  Assessment and Recommendations:  1. Dysphagia, GERD. Presumed recurrent esophageal stricture. Continue Nexium 40 mg twice a day and standard antireflux measures. The risks, benefits, and alternatives to endoscopy with possible biopsy and possible dilation were discussed with  the patient and they consent to proceed.

## 2013-11-19 ENCOUNTER — Ambulatory Visit: Payer: Medicare Other | Attending: Physician Assistant | Admitting: Physical Therapy

## 2013-11-19 DIAGNOSIS — R609 Edema, unspecified: Secondary | ICD-10-CM | POA: Diagnosis not present

## 2013-11-19 DIAGNOSIS — IMO0001 Reserved for inherently not codable concepts without codable children: Secondary | ICD-10-CM | POA: Insufficient documentation

## 2013-11-19 DIAGNOSIS — M25669 Stiffness of unspecified knee, not elsewhere classified: Secondary | ICD-10-CM | POA: Insufficient documentation

## 2013-11-19 DIAGNOSIS — M25569 Pain in unspecified knee: Secondary | ICD-10-CM | POA: Diagnosis not present

## 2013-11-21 ENCOUNTER — Ambulatory Visit: Payer: Medicare Other | Admitting: Rehabilitation

## 2013-11-25 ENCOUNTER — Ambulatory Visit: Payer: Medicare Other | Admitting: Physical Therapy

## 2013-11-26 ENCOUNTER — Encounter: Payer: Medicare Other | Admitting: Rehabilitation

## 2013-11-27 ENCOUNTER — Other Ambulatory Visit: Payer: Self-pay | Admitting: Physician Assistant

## 2013-11-27 NOTE — Telephone Encounter (Signed)
Patient has transferred to diff PCP.  Refill denied

## 2013-12-02 ENCOUNTER — Ambulatory Visit: Payer: Medicare Other | Admitting: Rehabilitation

## 2013-12-04 ENCOUNTER — Ambulatory Visit: Payer: Medicare Other | Admitting: Rehabilitation

## 2013-12-09 ENCOUNTER — Ambulatory Visit: Payer: Medicare Other | Admitting: Physical Therapy

## 2013-12-12 ENCOUNTER — Encounter: Payer: Medicare Other | Admitting: Rehabilitation

## 2013-12-17 ENCOUNTER — Encounter: Payer: Medicare Other | Admitting: Physical Therapy

## 2013-12-17 ENCOUNTER — Telehealth: Payer: Self-pay | Admitting: *Deleted

## 2013-12-17 DIAGNOSIS — Z471 Aftercare following joint replacement surgery: Secondary | ICD-10-CM | POA: Diagnosis not present

## 2013-12-17 NOTE — Telephone Encounter (Signed)
[facility] called to request NPI number for patient.  Pt is post-op right knee.  NPI given x 3 visits.  Clovis PuMartin, Marae Cottrell L, RN

## 2013-12-19 ENCOUNTER — Ambulatory Visit: Payer: Medicare Other | Attending: Physician Assistant | Admitting: Physical Therapy

## 2013-12-19 DIAGNOSIS — M25669 Stiffness of unspecified knee, not elsewhere classified: Secondary | ICD-10-CM | POA: Insufficient documentation

## 2013-12-19 DIAGNOSIS — R609 Edema, unspecified: Secondary | ICD-10-CM | POA: Diagnosis not present

## 2013-12-19 DIAGNOSIS — M25569 Pain in unspecified knee: Secondary | ICD-10-CM | POA: Insufficient documentation

## 2013-12-24 ENCOUNTER — Encounter: Payer: Medicare Other | Admitting: Physical Therapy

## 2014-01-03 ENCOUNTER — Ambulatory Visit (AMBULATORY_SURGERY_CENTER): Payer: Medicare Other | Admitting: Gastroenterology

## 2014-01-03 ENCOUNTER — Encounter: Payer: Self-pay | Admitting: Gastroenterology

## 2014-01-03 VITALS — BP 108/70 | HR 69 | Temp 98.0°F | Resp 13 | Ht 67.5 in | Wt 251.0 lb

## 2014-01-03 DIAGNOSIS — K219 Gastro-esophageal reflux disease without esophagitis: Secondary | ICD-10-CM

## 2014-01-03 DIAGNOSIS — R1319 Other dysphagia: Secondary | ICD-10-CM

## 2014-01-03 DIAGNOSIS — R1310 Dysphagia, unspecified: Secondary | ICD-10-CM | POA: Diagnosis not present

## 2014-01-03 DIAGNOSIS — K222 Esophageal obstruction: Secondary | ICD-10-CM | POA: Diagnosis not present

## 2014-01-03 DIAGNOSIS — I10 Essential (primary) hypertension: Secondary | ICD-10-CM | POA: Diagnosis not present

## 2014-01-03 DIAGNOSIS — E669 Obesity, unspecified: Secondary | ICD-10-CM | POA: Diagnosis not present

## 2014-01-03 MED ORDER — SODIUM CHLORIDE 0.9 % IV SOLN: 500.0000 mL | INTRAVENOUS | Status: DC

## 2014-01-03 NOTE — Progress Notes (Signed)
Procedure ends, to recovery, report given and VSS. 

## 2014-01-03 NOTE — Patient Instructions (Signed)
YOU HAD AN ENDOSCOPIC PROCEDURE TODAY AT THE [facility]: Refer to the procedure report that was given to you for any specific questions about what was found during the examination.  If the procedure report does not answer your questions, please call your gastroenterologist to clarify.  If you requested that your care partner not be given the details of your procedure findings, then the procedure report has been included in a sealed envelope for you to review at your convenience later.  YOU SHOULD EXPECT: Some feelings of bloating in the abdomen. Passage of more gas than usual.  Walking can help get rid of the air that was put into your GI tract during the procedure and reduce the bloating. If you had a lower endoscopy (such as a colonoscopy or flexible sigmoidoscopy) you may notice spotting of blood in your stool or on the toilet paper. If you underwent a bowel prep for your procedure, then you may not have a normal bowel movement for a few days.  DIET: see dilations diet- Drink plenty of fluids but you should avoid alcoholic beverages for 24 hours.  ACTIVITY: Your care partner should take you home directly after the procedure.  You should plan to take it easy, moving slowly for the rest of the day.  You can resume normal activity the day after the procedure however you should NOT DRIVE or use heavy machinery for 24 hours (because of the sedation medicines used during the test).    SYMPTOMS TO REPORT IMMEDIATELY: A gastroenterologist can be reached at any hour.  During normal business hours, 8:30 AM to 5:00 PM Monday through Friday, call [phone].  After hours and on weekends, please call the GI answering service at [phone] who will take a message and have the physician on call contact you.    Following upper endoscopy (EGD)  Vomiting of blood or coffee ground material  New chest pain or pain under the shoulder blades  Painful or persistently difficult swallowing  New  shortness of breath  Fever of 100F or higher  Black, tarry-looking stools  FOLLOW UP: If any biopsies were taken you will be contacted by phone or by letter within the next 1-3 weeks.  Call your gastroenterologist if you have not heard about the biopsies in 3 weeks.  Our staff will call the home number listed on your records the next business day following your procedure to check on you and address any questions or concerns that you may have at that time regarding the information given to you following your procedure. This is a courtesy call and so if there is no answer at the home number and we have not heard from you through the emergency physician on call, we will assume that you have returned to your regular daily activities without incident.  SIGNATURES/CONFIDENTIALITY: You and/or your care partner have signed paperwork which will be entered into your electronic medical record.  These signatures attest to the fact that that the information above on your After Visit Summary has been reviewed and is understood.  Full responsibility of the confidentiality of this discharge information lies with you and/or your care-partner.  Dilation diet, Anti-reflux regimen, hiatal hernia-handouts given

## 2014-01-03 NOTE — Op Note (Signed)
[facility] [address] [place], [id number]   ENDOSCOPY PROCEDURE REPORT  PATIENT: Kyle HaffShreve, Kyle A.  MR#: [id number] BIRTHDATE: 1971-06-23 , 42  yrs. old GENDER: Male ENDOSCOPIST: Meryl DareMalcolm T Stark, MD, [facility] PROCEDURE DATE:  01/03/2014 PROCEDURE:  EGD, diagnostic and Savary dilation of esophagus ASA CLASS:     Class II INDICATIONS:  Dysphagia.   History of esophageal reflux. MEDICATIONS: MAC sedation, administered by CRNA and propofol (Diprivan) 250mg  IV TOPICAL ANESTHETIC: none DESCRIPTION OF PROCEDURE: After the risks benefits and alternatives of the procedure were thoroughly explained, informed consent was obtained.  The LB [id number] endoscope was introduced through the mouth and advanced to the second portion of the duodenum. Without limitations.  The instrument was slowly withdrawn as the mucosa was fully examined.  ESOPHAGUS: A stricture was found at the gastroesophageal junction measuring about 13 mm.  The stenosis was traversable with the endoscope.   The esophagus was otherwise normal. STOMACH: The mucosa and folds of the stomach appeared normal. DUODENUM: The duodenal mucosa showed no abnormalities in the bulb and second portion of the duodenum.  Retroflexed views revealed a small hiatal hernia.  A guidewire was placed and the scope was then withdrawn from the patient. 15 mm and 16 mm Savary dilators were passed with moderate resistance and no heme and the procedure completed.  COMPLICATIONS: There were no complications.  ENDOSCOPIC IMPRESSION: 1.   Stricture at the gastroesophageal junction 2.   Small hiatal hernia  RECOMMENDATIONS: 1.  Anti-reflux regimen long term 2.  Continue PPI long term 3.  Post dilation instructions; consider balloon dilation if dysphagia returns.   eSigned:  Meryl DareMalcolm T Stark, MD, [facility] 01/03/2014 2:55 PM

## 2014-01-03 NOTE — Progress Notes (Signed)
Called to room to assist during endoscopic procedure.  Patient ID and intended procedure confirmed with present staff. Received instructions for my participation in the procedure from the performing physician.  

## 2014-01-06 ENCOUNTER — Telehealth: Payer: Self-pay | Admitting: *Deleted

## 2014-01-06 NOTE — Telephone Encounter (Signed)
  Follow up Call-  Call back number 01/03/2014  Post procedure Call Back phone  # [phone]  Permission to leave phone message Yes     Patient questions:  Do you have a fever, pain , or abdominal swelling? no Pain Score  0 *  Have you tolerated food without any problems? yes  Have you been able to return to your normal activities? yes  Do you have any questions about your discharge instructions: Diet   no Medications  no Follow up visit  no  Do you have questions or concerns about your Care? no  Actions: * If pain score is 4 or above: No action needed, pain <4.

## 2014-01-18 ENCOUNTER — Other Ambulatory Visit: Payer: Self-pay | Admitting: Family Medicine

## 2014-01-20 ENCOUNTER — Other Ambulatory Visit: Payer: Self-pay | Admitting: Family Medicine

## 2014-01-29 ENCOUNTER — Other Ambulatory Visit: Payer: Self-pay | Admitting: Family Medicine

## 2014-01-30 ENCOUNTER — Other Ambulatory Visit: Payer: Self-pay | Admitting: Family Medicine

## 2014-02-07 ENCOUNTER — Other Ambulatory Visit: Payer: Self-pay | Admitting: Physician Assistant

## 2014-03-15 ENCOUNTER — Other Ambulatory Visit: Payer: Self-pay | Admitting: Physician Assistant

## 2014-03-17 NOTE — Telephone Encounter (Signed)
Pt has been discharged from practice refills denied

## 2014-03-30 ENCOUNTER — Other Ambulatory Visit: Payer: Self-pay | Admitting: Family Medicine

## 2014-04-02 ENCOUNTER — Other Ambulatory Visit: Payer: Self-pay | Admitting: Family Medicine

## 2014-05-01 DIAGNOSIS — Z96659 Presence of unspecified artificial knee joint: Secondary | ICD-10-CM | POA: Diagnosis not present

## 2014-06-10 DIAGNOSIS — Z23 Encounter for immunization: Secondary | ICD-10-CM | POA: Diagnosis not present

## 2014-07-08 ENCOUNTER — Other Ambulatory Visit: Payer: Self-pay | Admitting: Family Medicine

## 2014-08-18 ENCOUNTER — Encounter: Payer: Self-pay | Admitting: Family Medicine

## 2014-08-18 ENCOUNTER — Ambulatory Visit (INDEPENDENT_AMBULATORY_CARE_PROVIDER_SITE_OTHER): Payer: Medicare Other | Admitting: Family Medicine

## 2014-08-18 VITALS — BP 122/83 | HR 70 | Temp 97.7°F | Resp 20 | Wt 257.0 lb

## 2014-08-18 DIAGNOSIS — K219 Gastro-esophageal reflux disease without esophagitis: Secondary | ICD-10-CM | POA: Diagnosis not present

## 2014-08-18 DIAGNOSIS — I10 Essential (primary) hypertension: Secondary | ICD-10-CM

## 2014-08-18 DIAGNOSIS — F32A Depression, unspecified: Secondary | ICD-10-CM

## 2014-08-18 DIAGNOSIS — E785 Hyperlipidemia, unspecified: Secondary | ICD-10-CM

## 2014-08-18 DIAGNOSIS — F329 Major depressive disorder, single episode, unspecified: Secondary | ICD-10-CM

## 2014-08-18 LAB — CBC
HCT: 41.3 % (ref 39.0–52.0)
HEMOGLOBIN: 14.9 g/dL (ref 13.0–17.0)
MCH: 29.8 pg (ref 26.0–34.0)
MCHC: 36.1 g/dL — ABNORMAL HIGH (ref 30.0–36.0)
MCV: 82.6 fL (ref 78.0–100.0)
Platelets: 254 10*3/uL (ref 150–400)
RBC: 5 MIL/uL (ref 4.22–5.81)
RDW: 13.6 % (ref 11.5–15.5)
WBC: 5.5 10*3/uL (ref 4.0–10.5)

## 2014-08-18 MED ORDER — VENLAFAXINE HCL 75 MG PO TABS: ORAL_TABLET | ORAL | Status: DC

## 2014-08-18 MED ORDER — NIACIN ER (ANTIHYPERLIPIDEMIC) 500 MG PO TBCR: EXTENDED_RELEASE_TABLET | ORAL | Status: DC

## 2014-08-18 MED ORDER — GABAPENTIN 600 MG PO TABS: 900.0000 mg | ORAL_TABLET | Freq: Every day | ORAL | Status: DC

## 2014-08-18 MED ORDER — ASPIRIN 81 MG PO TABS: 81.0000 mg | ORAL_TABLET | Freq: Every day | ORAL | Status: DC

## 2014-08-18 MED ORDER — ALLOPURINOL 100 MG PO TABS: 100.0000 mg | ORAL_TABLET | Freq: Every day | ORAL | Status: DC

## 2014-08-18 MED ORDER — ESOMEPRAZOLE MAGNESIUM 40 MG PO CPDR: DELAYED_RELEASE_CAPSULE | ORAL | Status: DC

## 2014-08-18 MED ORDER — SIMVASTATIN 20 MG PO TABS: 20.0000 mg | ORAL_TABLET | Freq: Every day | ORAL | Status: DC

## 2014-08-18 MED ORDER — LEVOCETIRIZINE DIHYDROCHLORIDE 5 MG PO TABS: ORAL_TABLET | ORAL | Status: DC

## 2014-08-18 MED ORDER — AMLODIPINE BESYLATE 10 MG PO TABS: 10.0000 mg | ORAL_TABLET | Freq: Every day | ORAL | Status: DC

## 2014-08-18 MED ORDER — BUPROPION HCL ER (XL) 150 MG PO TB24: 150.0000 mg | ORAL_TABLET | Freq: Every day | ORAL | Status: DC

## 2014-08-18 MED ORDER — LISINOPRIL 10 MG PO TABS: 10.0000 mg | ORAL_TABLET | Freq: Every day | ORAL | Status: DC

## 2014-08-18 NOTE — Assessment & Plan Note (Signed)
Well controlled - Advised taking Effexor BID instead of 2 pills at same time

## 2014-08-18 NOTE — Patient Instructions (Signed)
It was great seeing you today.   I have order some labs today. I will send you a letter with the results, or call you if we need to make any changes to your current therapies.  Take Cholesterol medication at night. Take Effexor twice   Please bring all your medications to every doctors visit  Sign up for My Chart to have easy access to your labs results, and communication with your Primary care physician.  Next Appointment  Please call to make an appointment with Dr Gayla DossJoyner in 1 year   I look forward to talking with you again at our next visit. If you have any questions or concerns before then, please call the clinic at [phone].  Take Care,   Dr Wenda LowJames Talullah Abate

## 2014-08-18 NOTE — Progress Notes (Signed)
  Patient name: Kyle Kennedy MRN [id number]  Date of birth: 08-18-1971  CC & HPI:  Kyle Kennedy is a 43 y.o. male presenting today for HTN, Depression, HLD.   CHRONIC HYPERTENSION  BP Readings from Last 3 Encounters:  08/18/14 122/83  01/03/14 108/70  11/18/13 124/90    Disease Monitoring  Blood pressure range outside clinc: not checking  Chest pain: no   Dyspnea: no   Claudication: no  Medication compliance/financial difficulties: yes  Medication Side Effects: denies Dizziness/lightheadedness; cough, angioedema,lightheadedness, rash, or LE edema) Preventitive Healthcare:   History  Smoking status  . Never Smoker   Smokeless tobacco  . Never Used    Exercise: just restarted exercise program s/p right knee surgery   ] Hyperlipidemia  Medication Compliance: yes, but taking in morning  Side Effects?: denies muscle pain or weakness, RUQ pain; jaundice  Depression - Reports mood well controlled with effexor but taking two pills once a day instead of BID  ROS: See HPI   Medical & Surgical Hx:  Reviewed  Medications & Allergies: Reviewed  Social History: Reviewed:   Objective Findings:  Vitals: BP 122/83 mmHg  Pulse 70  Temp(Src) 97.7 F (36.5 C) (Oral)  Resp 20  Wt 257 lb (116.574 kg)  SpO2 99%  Gen: NAD CV: RRR w/o m/r/g, pulses +2 b/l; No LE edema Resp: CTAB w/ normal respiratory effort  Assessment & Plan:   Please See Problem Focused Assessment & Plan

## 2014-08-18 NOTE — Assessment & Plan Note (Signed)
Will recheck Lipids at next visit - Continue statin and niacin but consider changing to lipitor at next visit pending LDL

## 2014-08-18 NOTE — Assessment & Plan Note (Signed)
Well controlled - Refilled meds - Check CBC, BMP - f/u in 1 year

## 2014-08-18 NOTE — Assessment & Plan Note (Signed)
Well controlled - Continue PPI BID

## 2014-08-19 ENCOUNTER — Encounter: Payer: Self-pay | Admitting: Family Medicine

## 2014-08-19 LAB — COMPREHENSIVE METABOLIC PANEL
ALBUMIN: 4.5 g/dL (ref 3.5–5.2)
ALT: 20 U/L (ref 0–53)
AST: 22 U/L (ref 0–37)
Alkaline Phosphatase: 101 U/L (ref 39–117)
BUN: 4 mg/dL — AB (ref 6–23)
CALCIUM: 9.5 mg/dL (ref 8.4–10.5)
CHLORIDE: 102 meq/L (ref 96–112)
CO2: 26 mEq/L (ref 19–32)
Creat: 0.85 mg/dL (ref 0.50–1.35)
Glucose, Bld: 122 mg/dL — ABNORMAL HIGH (ref 70–99)
POTASSIUM: 4.1 meq/L (ref 3.5–5.3)
SODIUM: 141 meq/L (ref 135–145)
TOTAL PROTEIN: 6.9 g/dL (ref 6.0–8.3)
Total Bilirubin: 0.4 mg/dL (ref 0.2–1.2)

## 2014-09-23 DIAGNOSIS — H40029 Open angle with borderline findings, high risk, unspecified eye: Secondary | ICD-10-CM | POA: Diagnosis not present

## 2014-09-27 ENCOUNTER — Other Ambulatory Visit: Payer: Self-pay | Admitting: Family Medicine

## 2014-09-27 DIAGNOSIS — I10 Essential (primary) hypertension: Secondary | ICD-10-CM

## 2014-11-17 ENCOUNTER — Encounter: Payer: Self-pay | Admitting: *Deleted

## 2014-11-17 NOTE — Progress Notes (Signed)
[facility]. PA form placed in provider box for completion. Clovis PuMartin, Tamika L, RN

## 2014-11-20 NOTE — Progress Notes (Signed)
Called [facility] to check status of prior authorization for nexium.  Per representative; there was a paid claim 11/17/2014.  Pt has paid claims until 04/20/2015.  [facility] informed.  Clovis PuMartin, Johsua Shevlin L, RN

## 2015-02-06 ENCOUNTER — Other Ambulatory Visit: Payer: Self-pay | Admitting: Family Medicine

## 2015-02-06 MED ORDER — SIMVASTATIN 20 MG PO TABS: 20.0000 mg | ORAL_TABLET | Freq: Every day | ORAL | Status: DC

## 2015-02-06 NOTE — Op Note (Signed)
PATIENT NAME:  Kyle Kennedy, Kyle Kennedy MR#:  [id number] DATE OF BIRTH:  Apr 03, 1971  DATE OF PROCEDURE:  07/26/2013  PREOPERATIVE DIAGNOSES:   1.  Retained hardware (intramedullary nail and locking screws) of the right tibia, status post ORIF of a right tibial shaft fracture.  2.  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   POSTOPERATIVE DIAGNOSES:  1.  Retained hardware (intramedullary nail and locking screws) of the right tibia, status post ORIF of a right tibial shaft fracture.  2.  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   PROCEDURE PERFORMED: Removal of right intramedullary tibial nail and 4 locking screws.   SURGEON:  Dr. Francesco SorJames Hooten  ANESTHESIA:  General.   ESTIMATED BLOOD LOSS:  100 mL.   FLUIDS REPLACED:  1500 mL of crystalloid.   DRAINS: None.   INDICATIONS FOR SURGERY:  The patient is a 44 year old male who underwent reduction and intramedullary fixation of a right tibial shaft fracture approximately 17 years ago. He has been seen for complaints of progressive right knee pain, and x-rays have demonstrated changes consistent with post traumatic degenerative arthritis of the knee and complete consolidation of the previous tibial shaft fracture. In preparation for potential total knee arthroplasty, recommendations were made for staged procedures with initial removal of the intramedullary nail and locking screws, and after appropriate healing time, return to the Operating Room for a total knee arthroplasty if indicated. After discussion of the risks and benefits of surgical intervention, the patient expressed understanding of the risks and benefits, and agreed with plans for surgical intervention.   PROCEDURE IN DETAIL: The patient was brought to the Operating Room, and after adequate general anesthesia was achieved, a tourniquet was placed on the patient's upper right thigh. The patient's right knee and leg were cleaned and prepped with alcohol and DuraPrep, and  draped in the usual sterile fashion. A "timeout" was performed, as per usual protocol. The C-arm was used to visualize the lower leg. Stab incisions were made in line with the previous surgical wounds and the 2 distal locking screws (5.0 mm) were removed. The proximal-most of the screws required use of a screw chaser. Next, the C-arm was used to visualize the proximal locking screws. Two stab incisions was made in line with the previous surgical scars, and the 2 proximal 5.0 mm locking screws were removed. Next, the right lower extremity was exsanguinated using an Esmarch, and the tourniquet was inflated to 300 mmHg. An anterior longitudinal incision was made in line with the previous surgical incision. Dissection was carried down in a medial aspect of the patellar tendon, and dissection carried down to the anterior cortex of the tibia. Localization of the proximal portion of the tibial nail was performed using the C-arm in both the AP and lateral planes. The entry site was completely occluded with new bone growth. After localization with the C-arm, an awl was used to create an initial opening. A threaded guidewire was then inserted down to the proximal portion of the tibial nail, and the site was over-reamed to a 12 mm diameter. An end cap was in place. This required use of a screw chaser to remove the end cap. Next, additional debridement of the surrounding bone was performed using a curette. The threaded extraction rod was engaged to the tibial nail. Excellent fixation was encountered, and the tibial nail was slowly and carefully extracted using multiple strikes with the slap hammer. The tibial nail was removed intact. The C-arm was used to visualize the tibia, and  there was no evidence of a fracture. No residual metal from the tibial nail or locking screws was intact. It should be noted that the previously-placed cannulated partially threaded cancellous screw under the tibial plateau was not addressed, as it was  felt that this could be removed at the time of the arthroplasty.   The tourniquet was deflated after a total tourniquet time of 120 minutes. Hemostasis was achieved using electrocautery. The stab incisions for removal of the locking screws were reapproximated using skin staples. The fascia adjacent to the patellar tendon was reapproximated using interrupted sutures of #1 Vicryl. The subcutaneous tissue was approximated in layers using first #0 Vicryl followed by #2-0 Vicryl. The skin was closed with skin staples. Then 0.25% Marcaine with epinephrine was injected along the incision sites. A sterile dressing was applied, followed by application of an ice wrap to the knee.   The patient tolerated the procedure well. He was transported to the recovery room in stable condition.    ____________________________ Illene Labrador. Angie Fava., MD jph:mr D: 07/27/2013 14:44:59 ET T: 07/27/2013 21:29:13 ET JOB#: [id number]  cc: Illene Labrador. Angie Fava., MD, <Dictator> JAMES P Angie Fava MD ELECTRONICALLY SIGNED 07/29/2013 7:11

## 2015-02-07 NOTE — Discharge Summary (Signed)
PATIENT NAME:  Kyle Kennedy, Kyle Kennedy MR#:  [id number] DATE OF BIRTH:  04/29/1971  DATE OF ADMISSION:  10/28/2013 DATE OF DISCHARGE:  10/31/2013   ADMITTING DIAGNOSIS: Removal of retained cancellous screw and washer with a right total knee arthroplasty using computer-aided navigation.   SURGEON: Illene LabradorJames P. Angie FavaHooten Jr., MD  ASSISTANT: Van ClinesJon Wolfe, PA  ANESTHESIA: Spinal and general.   ESTIMATED BLOOD LOSS: 150 mL.   TOURNIQUET TIME: 138 minutes.   IMPLANTS USED: DePuy PFC Sigma size 5 posterior stabilized femoral component (cemented), size 5 MBT tibial component (cemented), 38 mm 3 peg oval dome patella that was cemented, 10 mm stabilized rotating platform polyethylene insert, with gentamicin bone cement utilized.   The patient was stabilized, brought to the recovery room and then brought down to the orthopedic floor.   HISTORY: The patient is a 44 year old male who presented for an upcoming total knee replacement on the right. The patient was status post intramedullary tibial nail to the right lower leg from previous injury. The patient has had arthritic change within the knee and had difficulties with activities of daily living. The patient has been refractory to conservative management.   PHYSICAL EXAMINATION:  GENERAL: Well-developed, well-nourished male with antalgic gait involving the right lower extremity.  MUSCULOSKELETAL: In regard to the right knee, the patient has no real effusion. The patient has medial joint line tenderness. The patient has mild atrophy within the quadriceps. The patient has range of motion of full extension to 113 degrees of flexion.  LUNGS: Clear to auscultation.  CARDIOVASCULAR: He has normal rate and rhythm with no murmur.   HOSPITAL COURSE: After admission on 10/28/2013, the patient was brought to the orthopedic floor. On postoperative day 1, the patient had a hemoglobin of 11.7. On postoperative day 2, it was 11.1. The patient did ambulation to around the room  initially and then was ambulating 250 feet, including stairs, on the day before discharge. The patient was ready to go home with home health physical therapy.   DISCHARGE INSTRUCTIONS:  1. [facility] 11[facility]12/2013 at 8:45.  2. The patient will do weight bear as tolerated on the affected leg.  3. The patient will raise his leg with 1 to 2 pillows and use thigh-high TED hose on both legs, removed at bedtime and kept on during the day.  4. The patient will use incentive spirometer and do cough and deep breathing.  5. The patient will do a regular diet.  6. The patient will use a Polar Care to decrease swelling and can use this throughout the day. 7. The patient is to keep his dressing on, try not to get it wet.  8. The patient will call the clinic if there is any bright red bleeding, calf pain, bowel or bladder difficulty or any fever greater than 101.5.   DISCHARGE MEDICATIONS: Resume home medication and add Lovenox 40 mg subcutaneous once a day for 14 days, then start aspirin 81 mg once a day after he is finished with Lovenox. Oxycodone 5 mg 1 to 2 tablets every 4 hours as needed for severe pain, tramadol 50 mg 1 to 2 tablets every 4 hours as needed for less severe pain and Tylenol 1 tablet every 4 hours as needed for pain or fever greater than 100.4.   ____________________________ J. Dedra Skeensodd Julionna Marczak, [place] jtm:lb D: 10/31/2013 07:25:24 ET T: 10/31/2013 07:39:48 ET JOB#: [id number]  cc: J. Dedra Skeensodd Samvel Zinn, PA, <Dictator> J Chyrl Elwell [facility] PA ELECTRONICALLY SIGNED  11/01/2013 7:04 

## 2015-02-07 NOTE — Op Note (Signed)
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   PROCEDURE PERFORMED:   1.  Removal of retained cancellous screw and washer.  2.  Right total knee arthroplasty using computer-assisted navigation.   SURGEON:  Illene Labrador. Angie Fava., MD  ASSISTANT:  Van Clines, PA (required to maintain retraction throughout the procedure).   ANESTHESIA:  Spinal and general.   ESTIMATED BLOOD LOSS:  150 mL.   FLUIDS REPLACED:  1600 mL of crystalloid.   TOURNIQUET TIME:  138 minutes.   DRAINS:  Two medium drains to reinfusion system.   SOFT TISSUE RELEASES:  Anterior cruciate ligament, posterior cruciate ligament, deep medial collateral ligament, and patellofemoral ligament.   IMPLANTS UTILIZED:  DePuy PFC Sigma size 5 posterior stabilized femoral component (cemented), size 5 MBT tibial component (cemented), 38-mm 3-peg oval dome patella (cemented), and a 10-mm stabilized rotating platform polyethylene insert. Gentamicin bone cement was utilized.   INDICATIONS FOR SURGERY:  The patient is a 44 year old male, who has a history of remote open reduction and internal fixation of right tibial plateau and tibial shaft fractures. He has had progressive right knee pain, and x-rays demonstrated degenerative arthrosis of the right knee. The previously placed intramedullary nail had been removed, and the patient had done well following that procedure. A single 6.5-mm cancellous screw from the plateau fixation was still in place. After discussion of the risks and benefits of surgical intervention, the patient expressed  understanding of the risks and benefits, and agreed with plans for surgical intervention.   PROCEDURE IN DETAIL:  The patient was brought into the operating room, and adequate spinal (and later general anesthesia) was achieved. A tourniquet was placed on the patient's upper right thigh. The patient's right knee and leg were cleaned and prepped with alcohol and DuraPrep, draped in the usual sterile fashion. A "timeout" was performed as per usual protocol. The right lower extremity was exsanguinated using an Esmarch, and tourniquet was inflated to 300 mmHg. An anterior longitudinal incision was made in line with the previous surgical incision. A standard mid vastus approach was utilized, and a large effusion was evacuated. The deep fibers of the medial collateral ligament were elevated in a subperiosteal fashion off the medial flare of the tibia, so as to maintain a continuous soft tissue sleeve. The patella was subluxed laterally, and the patellofemoral ligament was incised. Inspection of the knee demonstrated severe degenerative changes in tricompartmental fashion. Prominent osteophytes were debrided using a rongeur. Anterior and posterior cruciate ligaments were excised. Two 4.0-mm Schanz pins were inserted into the femur and into the tibia for attachment of the array of trackers used for computer-assisted navigation. Hip center was identified using circumduction technique. Distal landmarks were mapped using the computer. The distal femur and proximal tibia were mapped using the computer. The distal femoral cutting guide was positioned using computer-assisted navigation, so as to achieve 5-degree distal valgus cut. Cut was performed and verified using the computer. Distal femur was sized, and it was felt that a size 5 femoral component was appropriate. A size 5 cutting guide was positioned, and the anterior cut was performed and verified using the  computer. This was followed by completion of the posterior and  chamfer cuts. Femoral cutting guide for the central box was then positioned, and the central box cut was performed.   Attention was then directed to the proximal tibia. Medial and lateral menisci were excised. Dissection was continued along the lateral aspect of the plateau, so as to visualize the head of the 6.5-mm cancellous screw and the washer. A stab incision was made laterally, and a screwdriver was inserted, and the screw and washer were removed without difficulty. The extramedullary tibial cutting guide was positioned using computer-assisted navigation, so as to achieve a 0-degree varus-valgus alignment. Cut was performed and verified using the computer. The proximal tibia was sized, and it was felt that a size 5 tibial tray was appropriate. Tibial and femoral trials were placed. Curved osteotomes were used to remove osteophytes off of the posterior condyles. A 10-mm polyethylene trial was inserted, and the knee was brought through a range of motion. Excellent mediolateral soft tissue balance was appreciated, both in full extension and in flexion. Finally, the patella was cut and prepared, so as to accommodate a 38-mm 3-peg oval dome patella. Patellar trial was placed, and the knee was placed through a range of motion with excellent patellar tracking appreciated.   Femoral trial was removed. Central post hole for the tibial component was reamed, followed by insertion of a keel punch. Fibrotic tissue from the anterior portion of the post hole was debrided, and this was in line with the previously placed intramedullary nail. Cancellous bone graft was harvested from the cut pieces of bone and packed in the site. The bone was irrigated with copious amounts of normal saline with antibiotic solution, then suctioned dry. Polymethylmethacrylate cement with gentamicin was prepared in the usual fashion using a vacuum mixer. Cement was applied to the cut surface of the proximal tibia, as well as the undersurface of  the size 5 MBT tibial component. The tibial component was positioned and impacted into place. Excess cement was removed using Personal assistant. Cement was then applied to the cut surface of the femur, as well as along the posterior flanges of the size 5 posterior stabilized femoral component. Femoral component was positioned and impacted into place. Excess cement was removed using Personal assistant. A 10-mm stabilized polyethylene trial was inserted, and the knee was brought into full extension with steady axial compression applied. Finally, cement was applied to the backside of the 38-mm 3-peg oval dome patella, and patellar component was positioned and patellar clamp applied. Excess cement was removed using Personal assistant.   After adequate curing of cement, the tourniquet was deflated after a total tourniquet time of 138 minutes. Hemostasis was achieved using electrocautery. The knee was irrigated with copious amounts of normal saline with antibiotic solution and then suctioned dry. The knee was inspected for any residual cement debris. Then, 20 mL of 1.3% Exparel and 40 mL of normal saline were injected along the posterior capsule, medial and lateral gutters, and along the arthrotomy site. A 10-mm stabilized rotating platform polyethylene insert was inserted, and the knee was placed through a range of motion. Excellent mediolateral soft tissue balancing was appreciated, both in full extension and in flexion. Excellent patellar tracking was noted. Two medium drains were placed in the wound bed and brought out through a separate stab incision to be attached to reinfusion system. The medial parapatellar portion of the incision was reapproximated using interrupted sutures of #1 Vicryl. The subcutaneous tissue was reapproximated in layers using  first #0 Vicryl, followed by #2-0 Vicryl. The subcutaneous tissue was injected with 30 mL of 0.25% Marcaine with epinephrine. Skin was closed with skin staples. A sterile  dressing was applied.   The patient tolerated the procedure well. He was transported to the recovery room in stable condition.    ____________________________ Illene LabradorJames P. Angie FavaHooten Jr., MD jph:ms D: 10/28/2013 16:23:11 ET T: 10/28/2013 23:06:54 ET JOB#: [id number]  cc: Illene LabradorJames P. Angie FavaHooten Jr., MD, <Dictator> Illene LabradorJAMES P Angie FavaHOOTEN JR MD ELECTRONICALLY SIGNED 11/03/2013 23:46

## 2015-02-12 ENCOUNTER — Other Ambulatory Visit: Payer: Self-pay | Admitting: Family Medicine

## 2015-02-12 DIAGNOSIS — T7840XA Allergy, unspecified, initial encounter: Secondary | ICD-10-CM

## 2015-02-16 ENCOUNTER — Other Ambulatory Visit: Payer: Self-pay | Admitting: *Deleted

## 2015-02-17 MED ORDER — ESOMEPRAZOLE MAGNESIUM 40 MG PO CPDR: DELAYED_RELEASE_CAPSULE | ORAL | Status: DC

## 2015-03-30 ENCOUNTER — Other Ambulatory Visit: Payer: Self-pay | Admitting: Family Medicine

## 2015-04-29 ENCOUNTER — Encounter: Payer: Self-pay | Admitting: Gastroenterology

## 2015-07-27 DIAGNOSIS — Z23 Encounter for immunization: Secondary | ICD-10-CM | POA: Diagnosis not present

## 2015-08-03 ENCOUNTER — Other Ambulatory Visit: Payer: Self-pay | Admitting: Family Medicine

## 2015-08-11 ENCOUNTER — Other Ambulatory Visit: Payer: Self-pay | Admitting: *Deleted

## 2015-08-11 DIAGNOSIS — I10 Essential (primary) hypertension: Secondary | ICD-10-CM

## 2015-08-11 MED ORDER — AMLODIPINE BESYLATE 10 MG PO TABS: 10.0000 mg | ORAL_TABLET | Freq: Every day | ORAL | Status: DC

## 2015-08-11 MED ORDER — BUPROPION HCL ER (XL) 150 MG PO TB24: 150.0000 mg | ORAL_TABLET | Freq: Every day | ORAL | Status: DC

## 2015-09-06 IMAGING — CR RIGHT TIBIA AND FIBULA - 2 VIEW
1 series · 4 of 4 positions shown · non-contrast
Comparison: none

REASON FOR EXAM: postop (hardware removal)
COMMENTS:

[Series 1: ap · 0.17mm/px · 4 of 4 slices shown]
[im 1/4]
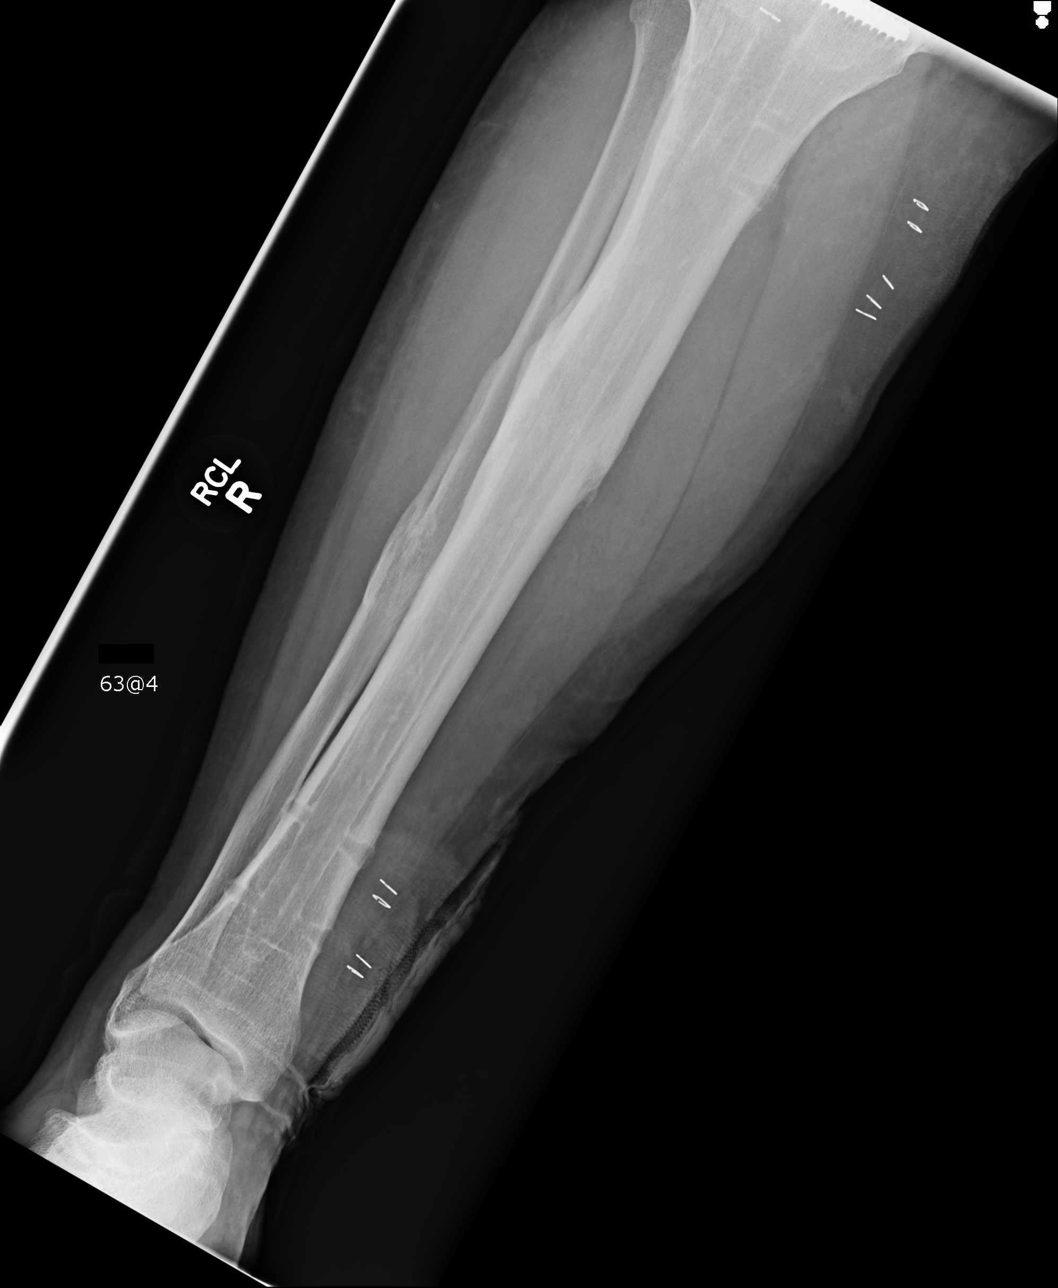
[im 2/4]
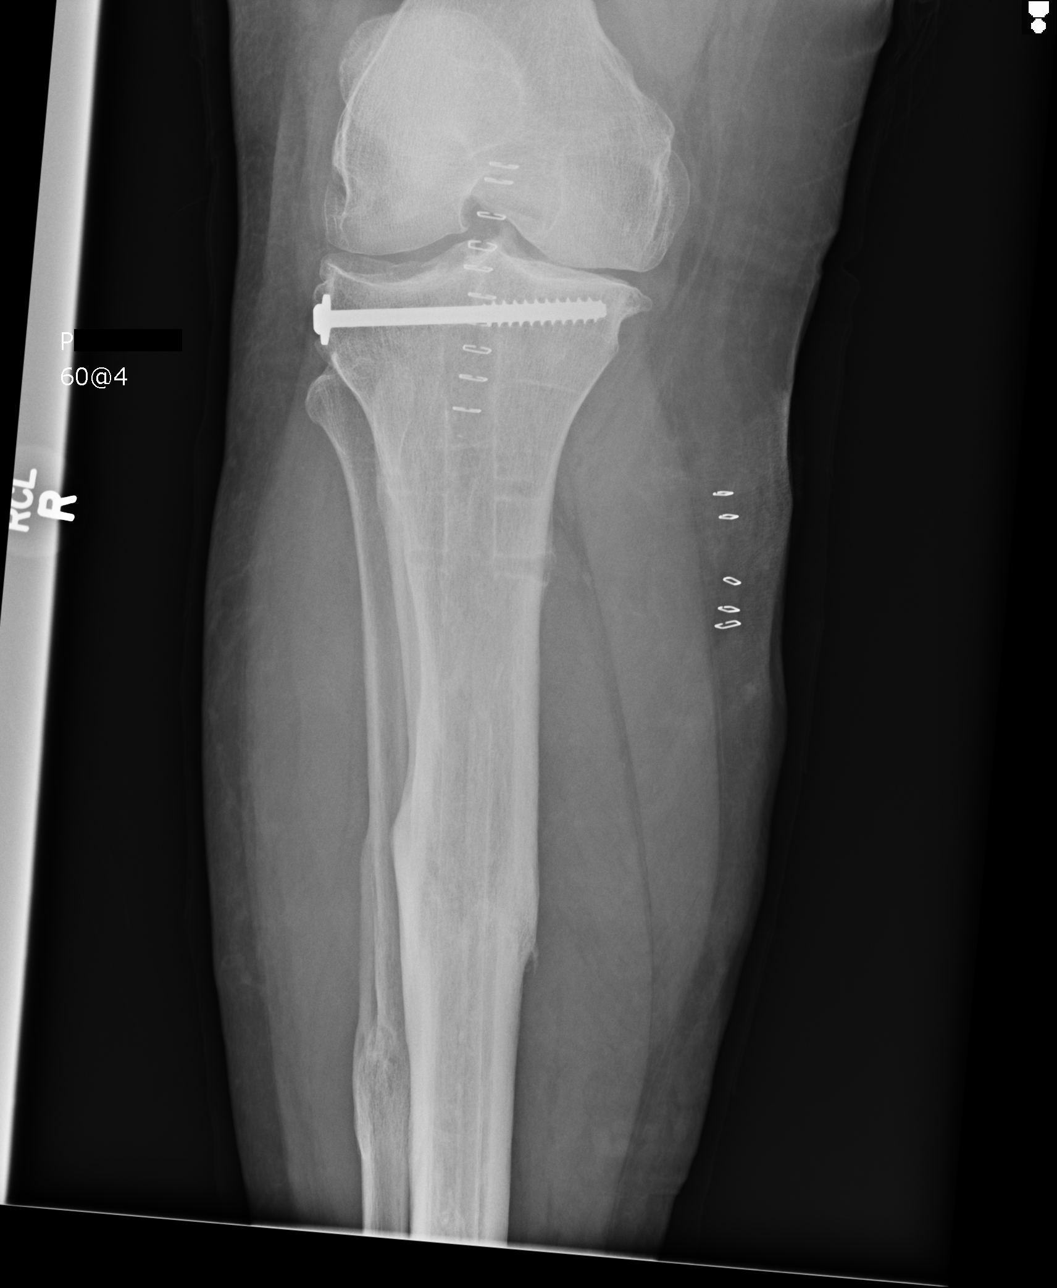
[im 3/4]
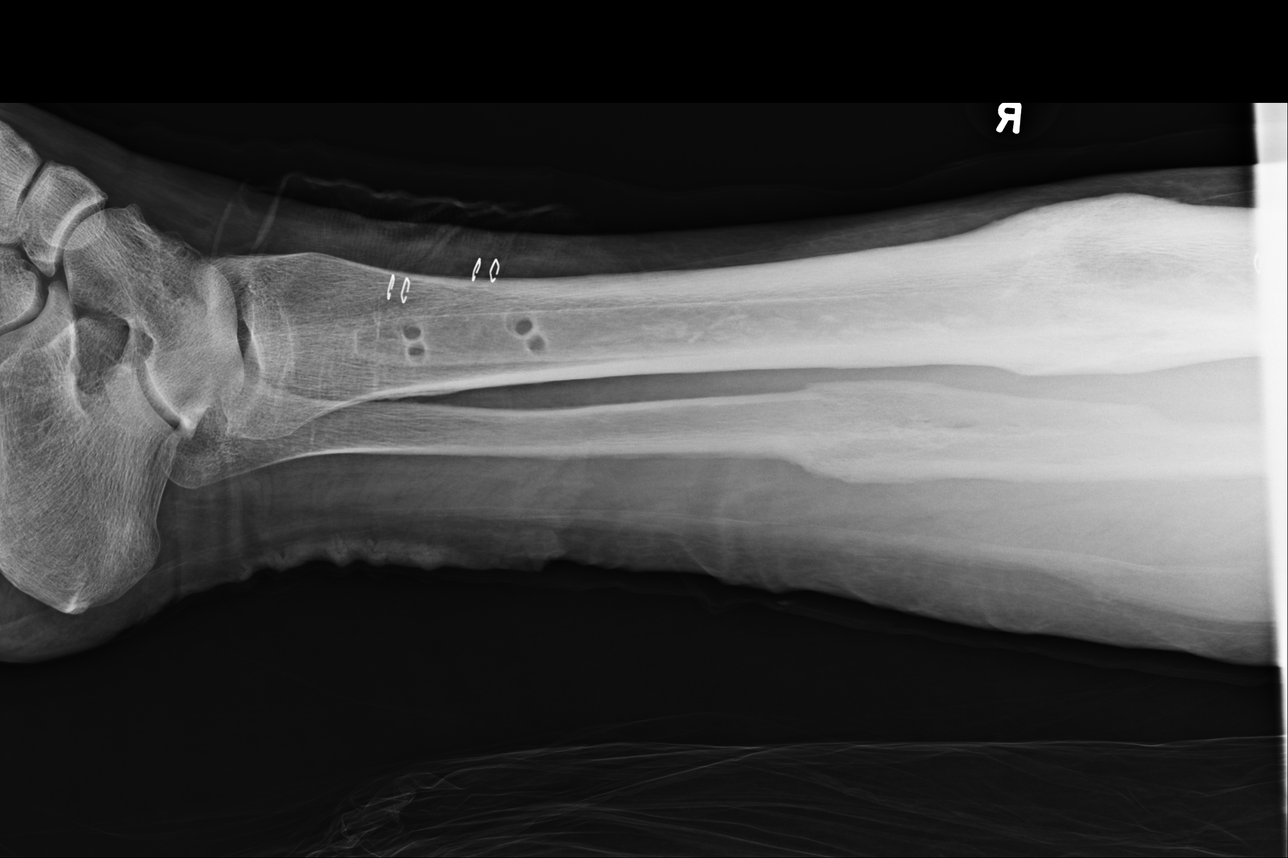
[im 4/4]
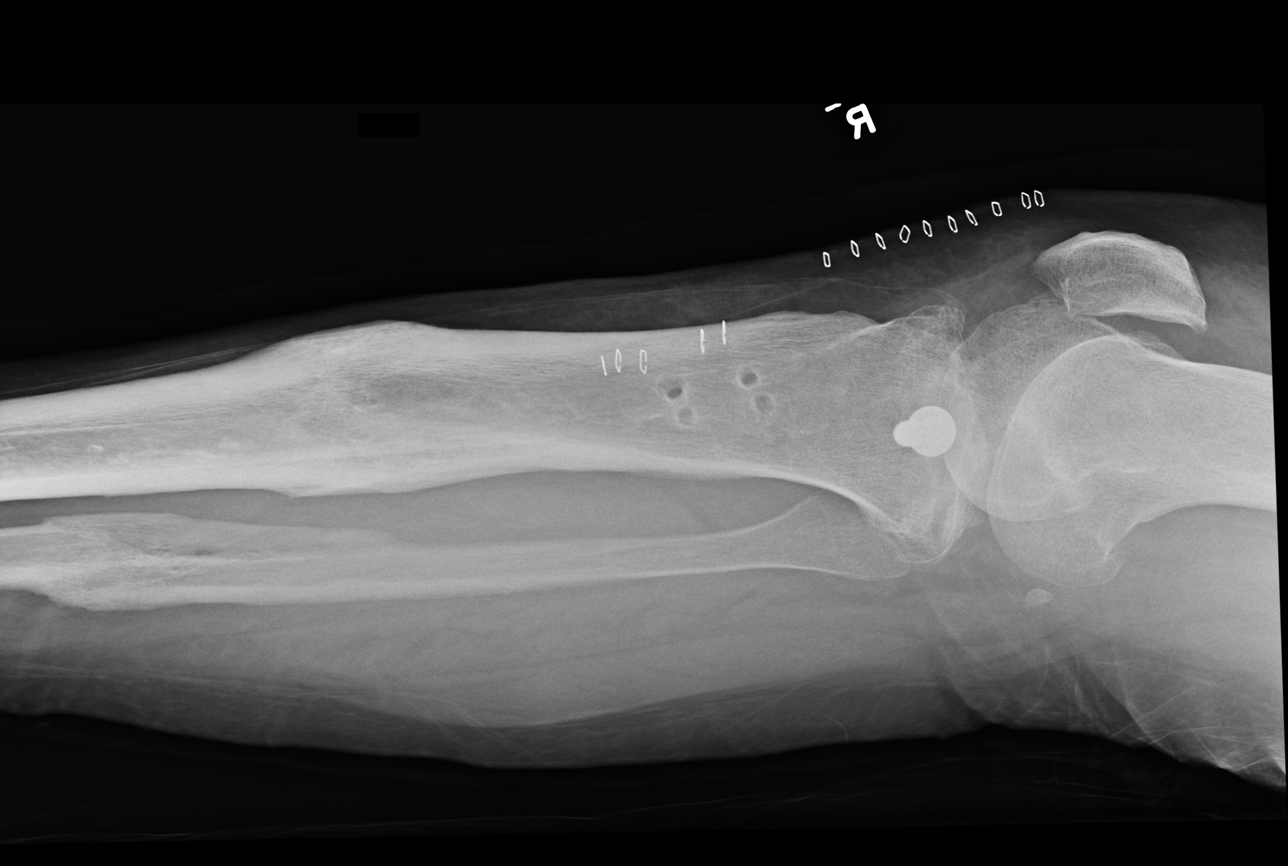

[4 of 4 positions shown; findings below may reference images not displayed]

RESULT:     Findings: AP and lateral views of the right tibia and fibula are
provided. There is interval removal of a tibial intramedullary nail and
multiple interlocking screws proximally and distally. There is old
posttraumatic deformity of a healed mid tibial diaphyseal fracture. There is
an old healed mid tibial diaphyseal fracture. There are surgical clips in
the overlying soft tissues. There is a lag screw transfixing an old lateral
tibial plateau fracture. There are tricompartmental degenerative changes of
the right knee.
IMPRESSION: Please see above.

## 2015-09-17 DIAGNOSIS — Z96651 Presence of right artificial knee joint: Secondary | ICD-10-CM | POA: Diagnosis not present

## 2015-09-21 ENCOUNTER — Other Ambulatory Visit: Payer: Self-pay | Admitting: Family Medicine

## 2015-09-21 ENCOUNTER — Other Ambulatory Visit: Payer: Self-pay | Admitting: *Deleted

## 2015-09-21 DIAGNOSIS — I10 Essential (primary) hypertension: Secondary | ICD-10-CM

## 2015-09-21 MED ORDER — LISINOPRIL 10 MG PO TABS: 10.0000 mg | ORAL_TABLET | Freq: Every day | ORAL | Status: DC

## 2015-09-23 ENCOUNTER — Other Ambulatory Visit: Payer: Self-pay | Admitting: Family Medicine

## 2015-09-23 NOTE — Telephone Encounter (Signed)
Please call. Needs appointment to discuss mood and gout. I have refilled allopurinol and venlafaxine medication for 3 months but needs to be seen prior to additional refills.

## 2015-09-23 NOTE — Telephone Encounter (Signed)
Letter mailed to patient to call our office to schedule an appt.  PCP doesn't have any openings until the first of the year. Kaisen Ackers,CMA

## 2015-10-22 ENCOUNTER — Encounter: Payer: Self-pay | Admitting: Family Medicine

## 2015-10-22 DIAGNOSIS — M79672 Pain in left foot: Secondary | ICD-10-CM

## 2015-11-03 ENCOUNTER — Other Ambulatory Visit: Payer: Self-pay | Admitting: Family Medicine

## 2015-11-04 ENCOUNTER — Other Ambulatory Visit: Payer: Self-pay | Admitting: Family Medicine

## 2015-11-04 DIAGNOSIS — F32A Depression, unspecified: Secondary | ICD-10-CM

## 2015-11-04 DIAGNOSIS — F329 Major depressive disorder, single episode, unspecified: Secondary | ICD-10-CM

## 2015-11-04 DIAGNOSIS — M109 Gout, unspecified: Secondary | ICD-10-CM

## 2015-11-04 DIAGNOSIS — I10 Essential (primary) hypertension: Secondary | ICD-10-CM

## 2015-11-04 MED ORDER — BUPROPION HCL ER (XL) 150 MG PO TB24: 150.0000 mg | ORAL_TABLET | Freq: Every day | ORAL | Status: DC

## 2015-11-04 MED ORDER — LISINOPRIL 10 MG PO TABS: 10.0000 mg | ORAL_TABLET | Freq: Every day | ORAL | Status: DC

## 2015-11-04 MED ORDER — VENLAFAXINE HCL 75 MG PO TABS: 75.0000 mg | ORAL_TABLET | Freq: Two times a day (BID) | ORAL | Status: DC

## 2015-11-04 MED ORDER — AMLODIPINE BESYLATE 10 MG PO TABS: 10.0000 mg | ORAL_TABLET | Freq: Every day | ORAL | Status: DC

## 2015-11-04 MED ORDER — ALLOPURINOL 100 MG PO TABS: 100.0000 mg | ORAL_TABLET | Freq: Every day | ORAL | Status: DC

## 2015-11-08 ENCOUNTER — Other Ambulatory Visit: Payer: Self-pay | Admitting: Family Medicine

## 2015-11-09 ENCOUNTER — Encounter: Payer: Self-pay | Admitting: Family Medicine

## 2015-11-09 ENCOUNTER — Other Ambulatory Visit: Payer: Self-pay | Admitting: Family Medicine

## 2015-11-13 DIAGNOSIS — M24575 Contracture, left foot: Secondary | ICD-10-CM | POA: Diagnosis not present

## 2015-11-26 DIAGNOSIS — H4720 Unspecified optic atrophy: Secondary | ICD-10-CM | POA: Diagnosis not present

## 2015-11-26 DIAGNOSIS — H04123 Dry eye syndrome of bilateral lacrimal glands: Secondary | ICD-10-CM | POA: Diagnosis not present

## 2015-11-26 DIAGNOSIS — H40013 Open angle with borderline findings, low risk, bilateral: Secondary | ICD-10-CM | POA: Diagnosis not present

## 2015-11-26 DIAGNOSIS — H47031 Optic nerve hypoplasia, right eye: Secondary | ICD-10-CM | POA: Diagnosis not present

## 2015-11-26 DIAGNOSIS — H53462 Homonymous bilateral field defects, left side: Secondary | ICD-10-CM | POA: Diagnosis not present

## 2015-11-26 DIAGNOSIS — H4900 Third [oculomotor] nerve palsy, unspecified eye: Secondary | ICD-10-CM | POA: Diagnosis not present

## 2015-11-26 DIAGNOSIS — H47292 Other optic atrophy, left eye: Secondary | ICD-10-CM | POA: Diagnosis not present

## 2015-12-07 DIAGNOSIS — M2042 Other hammer toe(s) (acquired), left foot: Secondary | ICD-10-CM | POA: Diagnosis not present

## 2015-12-07 DIAGNOSIS — M24575 Contracture, left foot: Secondary | ICD-10-CM | POA: Diagnosis not present

## 2015-12-10 ENCOUNTER — Other Ambulatory Visit: Payer: Self-pay | Admitting: Orthopedic Surgery

## 2015-12-18 ENCOUNTER — Encounter (HOSPITAL_BASED_OUTPATIENT_CLINIC_OR_DEPARTMENT_OTHER): Payer: Self-pay | Admitting: *Deleted

## 2015-12-19 ENCOUNTER — Other Ambulatory Visit: Payer: Self-pay | Admitting: Family Medicine

## 2015-12-20 ENCOUNTER — Other Ambulatory Visit: Payer: Self-pay | Admitting: Family Medicine

## 2015-12-21 ENCOUNTER — Encounter (HOSPITAL_BASED_OUTPATIENT_CLINIC_OR_DEPARTMENT_OTHER)
Admission: RE | Admit: 2015-12-21 | Discharge: 2015-12-21 | Disposition: A | Discharge status: Home / Self Care | Payer: Medicare Other | Source: Ambulatory Visit | Attending: Orthopedic Surgery | Admitting: Orthopedic Surgery

## 2015-12-21 ENCOUNTER — Other Ambulatory Visit: Payer: Self-pay

## 2015-12-21 DIAGNOSIS — M2042 Other hammer toe(s) (acquired), left foot: Secondary | ICD-10-CM | POA: Diagnosis not present

## 2015-12-21 DIAGNOSIS — K219 Gastro-esophageal reflux disease without esophagitis: Secondary | ICD-10-CM | POA: Diagnosis not present

## 2015-12-21 DIAGNOSIS — Z96651 Presence of right artificial knee joint: Secondary | ICD-10-CM | POA: Diagnosis not present

## 2015-12-21 DIAGNOSIS — Z7982 Long term (current) use of aspirin: Secondary | ICD-10-CM | POA: Diagnosis not present

## 2015-12-21 DIAGNOSIS — I10 Essential (primary) hypertension: Secondary | ICD-10-CM | POA: Diagnosis not present

## 2015-12-21 DIAGNOSIS — Z87820 Personal history of traumatic brain injury: Secondary | ICD-10-CM | POA: Diagnosis not present

## 2015-12-21 NOTE — Pre-Procedure Instructions (Signed)
Pt in for EKG and Anesthesia consult, seen by Dr. Ivin Bootyrews.

## 2015-12-23 ENCOUNTER — Other Ambulatory Visit: Payer: Self-pay | Admitting: Family Medicine

## 2015-12-24 ENCOUNTER — Encounter (HOSPITAL_BASED_OUTPATIENT_CLINIC_OR_DEPARTMENT_OTHER)
Admission: RE | Disposition: A | Discharge status: Home / Self Care | Payer: Self-pay | Source: Ambulatory Visit | Attending: Orthopedic Surgery

## 2015-12-24 ENCOUNTER — Ambulatory Visit (HOSPITAL_BASED_OUTPATIENT_CLINIC_OR_DEPARTMENT_OTHER): Payer: Medicare Other | Admitting: Certified Registered"

## 2015-12-24 ENCOUNTER — Ambulatory Visit (HOSPITAL_BASED_OUTPATIENT_CLINIC_OR_DEPARTMENT_OTHER)
Admission: RE | Admit: 2015-12-24 | Discharge: 2015-12-24 | Disposition: A | Discharge status: Home / Self Care | Payer: Medicare Other | Source: Ambulatory Visit | Attending: Orthopedic Surgery | Admitting: Orthopedic Surgery

## 2015-12-24 ENCOUNTER — Encounter (HOSPITAL_BASED_OUTPATIENT_CLINIC_OR_DEPARTMENT_OTHER): Payer: Self-pay | Admitting: Certified Registered"

## 2015-12-24 DIAGNOSIS — M2042 Other hammer toe(s) (acquired), left foot: Secondary | ICD-10-CM | POA: Diagnosis not present

## 2015-12-24 DIAGNOSIS — M79675 Pain in left toe(s): Secondary | ICD-10-CM | POA: Diagnosis not present

## 2015-12-24 DIAGNOSIS — Z96651 Presence of right artificial knee joint: Secondary | ICD-10-CM | POA: Insufficient documentation

## 2015-12-24 DIAGNOSIS — Z7982 Long term (current) use of aspirin: Secondary | ICD-10-CM | POA: Insufficient documentation

## 2015-12-24 DIAGNOSIS — Z87820 Personal history of traumatic brain injury: Secondary | ICD-10-CM | POA: Diagnosis not present

## 2015-12-24 DIAGNOSIS — M24575 Contracture, left foot: Secondary | ICD-10-CM | POA: Diagnosis not present

## 2015-12-24 DIAGNOSIS — M2041 Other hammer toe(s) (acquired), right foot: Secondary | ICD-10-CM | POA: Diagnosis not present

## 2015-12-24 DIAGNOSIS — K219 Gastro-esophageal reflux disease without esophagitis: Secondary | ICD-10-CM | POA: Insufficient documentation

## 2015-12-24 DIAGNOSIS — I10 Essential (primary) hypertension: Secondary | ICD-10-CM | POA: Insufficient documentation

## 2015-12-24 DIAGNOSIS — G8918 Other acute postprocedural pain: Secondary | ICD-10-CM | POA: Diagnosis not present

## 2015-12-24 HISTORY — PX: HAMMER TOE SURGERY: SHX385

## 2015-12-24 HISTORY — DX: Anxiety disorder, unspecified: F41.9

## 2015-12-24 SURGERY — CORRECTION, HAMMER TOE
Anesthesia: General | Site: Toe | Laterality: Left

## 2015-12-24 MED ORDER — LACTATED RINGERS IV SOLN
INTRAVENOUS | Status: DC
  Administered 2015-12-24: 09:00:00 via INTRAVENOUS

## 2015-12-24 MED ORDER — MIDAZOLAM HCL 2 MG/2ML IJ SOLN
INTRAMUSCULAR | Status: AC
  Filled 2015-12-24: qty 2

## 2015-12-24 MED ORDER — ONDANSETRON HCL 4 MG/2ML IJ SOLN
INTRAMUSCULAR | Status: DC | PRN
  Administered 2015-12-24: 4 mg via INTRAVENOUS

## 2015-12-24 MED ORDER — SODIUM CHLORIDE 0.9 % IV SOLN: INTRAVENOUS | Status: DC

## 2015-12-24 MED ORDER — ONDANSETRON HCL 4 MG/2ML IJ SOLN
INTRAMUSCULAR | Status: AC
  Filled 2015-12-24: qty 2

## 2015-12-24 MED ORDER — DEXAMETHASONE SODIUM PHOSPHATE 10 MG/ML IJ SOLN
INTRAMUSCULAR | Status: DC | PRN
  Administered 2015-12-24: 10 mg via INTRAVENOUS

## 2015-12-24 MED ORDER — DOCUSATE SODIUM 100 MG PO CAPS: 100.0000 mg | ORAL_CAPSULE | Freq: Two times a day (BID) | ORAL | Status: DC

## 2015-12-24 MED ORDER — CEFAZOLIN SODIUM-DEXTROSE 2-3 GM-% IV SOLR
INTRAVENOUS | Status: AC
  Filled 2015-12-24: qty 50

## 2015-12-24 MED ORDER — SENNA 8.6 MG PO TABS: 2.0000 | ORAL_TABLET | Freq: Two times a day (BID) | ORAL | Status: DC

## 2015-12-24 MED ORDER — LACTATED RINGERS IV SOLN: INTRAVENOUS | Status: DC

## 2015-12-24 MED ORDER — FENTANYL CITRATE (PF) 100 MCG/2ML IJ SOLN: 25.0000 ug | INTRAMUSCULAR | Status: DC | PRN

## 2015-12-24 MED ORDER — LIDOCAINE HCL (CARDIAC) 20 MG/ML IV SOLN
INTRAVENOUS | Status: AC
  Filled 2015-12-24: qty 5

## 2015-12-24 MED ORDER — SCOPOLAMINE 1 MG/3DAYS TD PT72: 1.0000 | MEDICATED_PATCH | Freq: Once | TRANSDERMAL | Status: DC | PRN

## 2015-12-24 MED ORDER — OXYCODONE HCL 5 MG PO TABS: 5.0000 mg | ORAL_TABLET | ORAL | Status: DC | PRN

## 2015-12-24 MED ORDER — FENTANYL CITRATE (PF) 100 MCG/2ML IJ SOLN
INTRAMUSCULAR | Status: AC
  Filled 2015-12-24: qty 2

## 2015-12-24 MED ORDER — LIDOCAINE HCL (CARDIAC) 20 MG/ML IV SOLN
INTRAVENOUS | Status: DC | PRN
  Administered 2015-12-24: 30 mg via INTRAVENOUS

## 2015-12-24 MED ORDER — DEXAMETHASONE SODIUM PHOSPHATE 10 MG/ML IJ SOLN
INTRAMUSCULAR | Status: AC
  Filled 2015-12-24: qty 1

## 2015-12-24 MED ORDER — GLYCOPYRROLATE 0.2 MG/ML IJ SOLN: 0.2000 mg | Freq: Once | INTRAMUSCULAR | Status: DC | PRN

## 2015-12-24 MED ORDER — FENTANYL CITRATE (PF) 100 MCG/2ML IJ SOLN
50.0000 ug | INTRAMUSCULAR | Status: DC | PRN
  Administered 2015-12-24: 100 ug via INTRAVENOUS

## 2015-12-24 MED ORDER — BUPIVACAINE-EPINEPHRINE (PF) 0.5% -1:200000 IJ SOLN
INTRAMUSCULAR | Status: DC | PRN
  Administered 2015-12-24: 20 mL via PERINEURAL

## 2015-12-24 MED ORDER — CHLORHEXIDINE GLUCONATE 4 % EX LIQD: 60.0000 mL | Freq: Once | CUTANEOUS | Status: DC

## 2015-12-24 MED ORDER — MEPERIDINE HCL 25 MG/ML IJ SOLN: 6.2500 mg | INTRAMUSCULAR | Status: DC | PRN

## 2015-12-24 MED ORDER — PROMETHAZINE HCL 25 MG/ML IJ SOLN: 6.2500 mg | INTRAMUSCULAR | Status: DC | PRN

## 2015-12-24 MED ORDER — MIDAZOLAM HCL 2 MG/2ML IJ SOLN
1.0000 mg | INTRAMUSCULAR | Status: DC | PRN
  Administered 2015-12-24 (×2): 2 mg via INTRAVENOUS

## 2015-12-24 MED ORDER — PROPOFOL 10 MG/ML IV BOLUS
INTRAVENOUS | Status: DC | PRN
  Administered 2015-12-24: 200 mg via INTRAVENOUS

## 2015-12-24 MED ORDER — CEFAZOLIN SODIUM-DEXTROSE 2-3 GM-% IV SOLR
2.0000 g | INTRAVENOUS | Status: AC
  Administered 2015-12-24: 2 g via INTRAVENOUS

## 2015-12-24 SURGICAL SUPPLY — 70 items
BANDAGE ESMARK 6X9 LF (GAUZE/BANDAGES/DRESSINGS) ×1 IMPLANT
BLADE AVERAGE 25X9 (BLADE) IMPLANT
BLADE LONG MED 25X9 (BLADE) IMPLANT
BLADE OSC/SAG .038X5.5 CUT EDG (BLADE) ×2 IMPLANT
BLADE SURG 15 STRL LF DISP TIS (BLADE) ×2 IMPLANT
BLADE SURG 15 STRL SS (BLADE) ×4
BNDG CMPR 9X4 STRL LF SNTH (GAUZE/BANDAGES/DRESSINGS)
BNDG CMPR 9X6 STRL LF SNTH (GAUZE/BANDAGES/DRESSINGS) ×1
BNDG COHESIVE 4X5 TAN STRL (GAUZE/BANDAGES/DRESSINGS) ×2 IMPLANT
BNDG CONFORM 2 STRL LF (GAUZE/BANDAGES/DRESSINGS) IMPLANT
BNDG CONFORM 3 STRL LF (GAUZE/BANDAGES/DRESSINGS) ×2 IMPLANT
BNDG ESMARK 4X9 LF (GAUZE/BANDAGES/DRESSINGS) IMPLANT
BNDG ESMARK 6X9 LF (GAUZE/BANDAGES/DRESSINGS) ×2
CAP PIN PROTECTOR ORTHO WHT (CAP) IMPLANT
CHLORAPREP W/TINT 26ML (MISCELLANEOUS) ×2 IMPLANT
COVER BACK TABLE 60X90IN (DRAPES) ×2 IMPLANT
CUFF TOURNIQUET SINGLE 34IN LL (TOURNIQUET CUFF) IMPLANT
DRAPE EXTREMITY T 121X128X90 (DRAPE) ×2 IMPLANT
DRAPE OEC MINIVIEW 54X84 (DRAPES) ×2 IMPLANT
DRAPE U-SHAPE 47X51 STRL (DRAPES) ×2 IMPLANT
DRSG MEPITEL 4X7.2 (GAUZE/BANDAGES/DRESSINGS) ×2 IMPLANT
DRSG PAD ABDOMINAL 8X10 ST (GAUZE/BANDAGES/DRESSINGS) ×2 IMPLANT
ELECT REM PT RETURN 9FT ADLT (ELECTROSURGICAL) ×2
ELECTRODE REM PT RTRN 9FT ADLT (ELECTROSURGICAL) ×1 IMPLANT
GAUZE SPONGE 4X4 12PLY STRL (GAUZE/BANDAGES/DRESSINGS) ×2 IMPLANT
GLOVE BIO SURGEON STRL SZ8 (GLOVE) ×2 IMPLANT
GLOVE BIOGEL PI IND STRL 7.0 (GLOVE) IMPLANT
GLOVE BIOGEL PI IND STRL 8 (GLOVE) ×2 IMPLANT
GLOVE BIOGEL PI INDICATOR 7.0 (GLOVE) ×1
GLOVE BIOGEL PI INDICATOR 8 (GLOVE) ×2
GLOVE ECLIPSE 6.5 STRL STRAW (GLOVE) ×1 IMPLANT
GLOVE ECLIPSE 7.5 STRL STRAW (GLOVE) ×2 IMPLANT
GLOVE EXAM NITRILE MD LF STRL (GLOVE) ×1 IMPLANT
GOWN STRL REUS W/ TWL LRG LVL3 (GOWN DISPOSABLE) ×1 IMPLANT
GOWN STRL REUS W/ TWL XL LVL3 (GOWN DISPOSABLE) ×2 IMPLANT
GOWN STRL REUS W/TWL LRG LVL3 (GOWN DISPOSABLE) ×2
GOWN STRL REUS W/TWL XL LVL3 (GOWN DISPOSABLE) ×4
K-WIRE .054X4 (WIRE) IMPLANT
K-WIRE COCR 0.9X95 (WIRE) ×2
KWIRE COCR 0.9X95 (WIRE) IMPLANT
NEEDLE HYPO 22GX1.5 SAFETY (NEEDLE) IMPLANT
NS IRRIG 1000ML POUR BTL (IV SOLUTION) ×2 IMPLANT
PACK BASIN DAY SURGERY FS (CUSTOM PROCEDURE TRAY) ×2 IMPLANT
PAD CAST 4YDX4 CTTN HI CHSV (CAST SUPPLIES) ×1 IMPLANT
PADDING CAST ABS 4INX4YD NS (CAST SUPPLIES)
PADDING CAST ABS COTTON 4X4 ST (CAST SUPPLIES) IMPLANT
PADDING CAST COTTON 4X4 STRL (CAST SUPPLIES) ×2
PASSER SUT SWANSON 36MM LOOP (INSTRUMENTS) IMPLANT
PENCIL BUTTON HOLSTER BLD 10FT (ELECTRODE) ×2 IMPLANT
SANITIZER HAND PURELL 535ML FO (MISCELLANEOUS) ×2 IMPLANT
SCREW 2.5X30MM VPC (Wire) ×1 IMPLANT
SCREW VPC 2.5X22MM (Wire) IMPLANT
SHEET MEDIUM DRAPE 40X70 STRL (DRAPES) ×2 IMPLANT
SLEEVE SCD COMPRESS KNEE MED (MISCELLANEOUS) ×2 IMPLANT
SPONGE LAP 18X18 X RAY DECT (DISPOSABLE) ×2 IMPLANT
STOCKINETTE 6  STRL (DRAPES) ×1
STOCKINETTE 6 STRL (DRAPES) ×1 IMPLANT
SUCTION FRAZIER HANDLE 10FR (MISCELLANEOUS) ×1
SUCTION TUBE FRAZIER 10FR DISP (MISCELLANEOUS) ×1 IMPLANT
SUT ETHILON 3 0 PS 1 (SUTURE) ×2 IMPLANT
SUT MNCRL AB 3-0 PS2 18 (SUTURE) ×2 IMPLANT
SUT VIC AB 2-0 SH 27 (SUTURE)
SUT VIC AB 2-0 SH 27XBRD (SUTURE) IMPLANT
SYR BULB 3OZ (MISCELLANEOUS) ×2 IMPLANT
SYR CONTROL 10ML LL (SYRINGE) IMPLANT
TOWEL OR 17X24 6PK STRL BLUE (TOWEL DISPOSABLE) ×2 IMPLANT
TUBE CONNECTING 20X1/4 (TUBING) ×2 IMPLANT
UNDERPAD 30X30 (UNDERPADS AND DIAPERS) ×2 IMPLANT
VPC SCREW 2.5X22MM (Wire) ×2 IMPLANT
YANKAUER SUCT BULB TIP NO VENT (SUCTIONS) IMPLANT

## 2015-12-24 NOTE — Anesthesia Preprocedure Evaluation (Addendum)
Anesthesia Evaluation  Patient identified by MRN, date of birth, ID band Patient awake    Reviewed: Allergy & Precautions, NPO status , Patient's Chart, lab work & pertinent test results  Airway Mallampati: II  TM Distance: >3 FB Neck ROM: Full    Dental no notable dental hx. (+) Lower Dentures, Partial Upper   Pulmonary shortness of breath,    Pulmonary exam normal breath sounds clear to auscultation       Cardiovascular hypertension, Pt. on medications Normal cardiovascular exam Rhythm:Regular Rate:Normal     Neuro/Psych PSYCHIATRIC DISORDERS Anxiety Depression traumatic brain injury optic nerve injury negative neurological ROS  negative psych ROS   GI/Hepatic Neg liver ROS, GERD  Medicated,  Endo/Other  negative endocrine ROS  Renal/GU negative Renal ROS  negative genitourinary   Musculoskeletal  (+) Arthritis ,   Abdominal   Peds negative pediatric ROS (+)  Hematology negative hematology ROS (+)   Anesthesia Other Findings   Reproductive/Obstetrics negative OB ROS                           Anesthesia Physical Anesthesia Plan  ASA: II  Anesthesia Plan: General   Post-op Pain Management: GA combined w/ Regional for post-op pain   Induction: Intravenous  Airway Management Planned: LMA  Additional Equipment:   Intra-op Plan:   Post-operative Plan: Extubation in OR  Informed Consent: I have reviewed the patients History and Physical, chart, labs and discussed the procedure including the risks, benefits and alternatives for the proposed anesthesia with the patient or authorized representative who has indicated his/her understanding and acceptance.   Dental advisory given  Plan Discussed with: CRNA  Anesthesia Plan Comments:         Anesthesia Quick Evaluation

## 2015-12-24 NOTE — Anesthesia Postprocedure Evaluation (Signed)
Anesthesia Post Note  Patient: Kyle Kennedy  Procedure(s) Performed: Procedure(s) (LRB): LEFT FOURTH AND FIFTH HAMMER TOE CORRECTION AND FLEXOR TENDON RELEASES  (Left)  Patient location during evaluation: PACU Anesthesia Type: General and Regional Level of consciousness: awake and alert Pain management: pain level controlled Vital Signs Assessment: post-procedure vital signs reviewed and stable Respiratory status: spontaneous breathing, nonlabored ventilation, respiratory function stable and patient connected to nasal cannula oxygen Cardiovascular status: blood pressure returned to baseline and stable Postop Assessment: no signs of nausea or vomiting Anesthetic complications: no    Last Vitals:  Filed Vitals:   12/24/15 1215 12/24/15 1225  BP: 123/72 124/72  Pulse: 78 80  Temp:  36.8 C  Resp: 18 16    Last Pain:  Filed Vitals:   12/24/15 1226  PainSc: 0-No pain                 Phillips Groutarignan, Tamilyn Lupien

## 2015-12-24 NOTE — Op Note (Signed)
NAMJoseph Kennedy:  Kennedy, Kyle Kennedy              ACCOUNT NO.:  [id number]  MEDICAL RECORD NO.:  [id number]  LOCATION:                                 FACILITY:  PHYSICIAN:  Toni ArthursJohn Lachina Salsberry, MD             DATE OF BIRTH:  DATE OF PROCEDURE:  12/24/2015 DATE OF DISCHARGE:                              OPERATIVE REPORT   PREOPERATIVE DIAGNOSIS:  Left 4th and 5th hammertoe deformities.  POSTOPERATIVE DIAGNOSIS:  Left 4th and 5th hammertoe deformities.  PROCEDURE: 1. Percutaneous flexor tendon releases of the left 4th and 5th toes     through separate incisions. 2. Left 4th hammertoe correction. 3. Left 5th hammertoe correction through a separate incision. 4. Left foot AP and lateral radiographs.  SURGEON:  Toni ArthursJohn Laurence Folz, MD  ASSISTANT:  Alfredo MartinezJustin Ollis, PA-C  ANESTHESIA:  General, regional.  ESTIMATED BLOOD LOSS:  Minimal.  TOURNIQUET TIME:  29 minutes at 250 mmHg.  COMPLICATIONS:  None apparent.  DISPOSITION:  Extubated, awake, and stable to recovery.  INDICATION FOR PROCEDURE:  The patient is a 45 year old male who has a past medical history significant for multitrauma as a child with closed head injury.  He has developed flexion contractures and fixed hammertoe deformities of the 4th and 5th toes of his left foot.  He has failed nonoperative treatment for these painful conditions.  He presents today for surgical treatment.  He specifically understands risks of bleeding, infection, nerve damage, blood clots, need for additional surgery, continued pain, recurrence of his deformities, amputation, and death.  PROCEDURE IN DETAIL:  After preoperative consent was obtained and the correct operative sites were identified, the patient was brought to the operating room and placed supine on the operating table.  General anesthesia was induced.  Preoperative antibiotics were administered. Surgical time-out was taken.  Left lower extremity was prepped and draped in standard sterile fashion with  tourniquet around the thigh. The extremity was exsanguinated and the tourniquet was inflated to 250 mmHg.  The 4th and 5th toes were then carefully examined.  Both were noted to have flexor tendon contractures and could now passively be dorsiflexed in neutral position.  Percutaneous tenotomy of the flexor digitorum longus was then performed for both toes.  This corrected the flexion contracture at the level of the MP joint, but the patient was still noted to have fixed hammertoe deformities of both the 4th and 5th toes.  Attention was then turned to the 4th toe where transverse incision was made over the dorsum of the PIP joint.  Sharp dissection was carried down through the skin and subcutaneous tissue and extensor mechanism. Collateral ligaments were released.  The head of the proximal phalanx was then resected with the oscillating saw followed by the base of the middle phalanx.  The joint was then reducible to a neutral position. The PIP joint was then fixed with a 2.5 mm Biomet cannulated headless compression screw.  AP and lateral radiographs confirmed appropriate position and length of the screw and appropriate correction of the hammertoe deformity.  Attention was then turned to the 5th toe.  Transverse incision was again made over the PIP joint.  Sharp dissection was  carried down through skin and subcutaneous tissue.  The head of the proximal phalanx was resected along with the base of the middle phalanx.  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  The PIP joint was then fixed with a 2.5 mm compression screw.  This was allowed across the DIP joint as well.  AP and lateral radiographs confirmed appropriate correction of the 5th toe deformity and appropriate position and length of all hardware.  The wounds were irrigated copiously and closed with nylon.  Sterile dressings were applied followed by a compression wrap. Tourniquet was released after  application of the dressings at 29 minutes.  The patient was awakened from anesthesia and transported to the recovery room in stable condition.  FOLLOWUP PLAN:  The patient will be weightbearing as tolerated on his left foot in a flat postop shoe.  He will follow up with me in the office in 2 weeks for suture removal.  RADIOGRAPHS:  AP and lateral radiographs of the left foot were obtained intraoperatively.  These show interval correction of 4th and 5th hammertoe deformities with appropriately positioned hardware.  No other acute injuries are noted.  [name], PA-C, was present and scrubbed for the duration of the case.  His assistance was essential in positioning the patient, prepping and draping, gaining and maintaining exposure, performing the operation, closing and dressing the wounds.     Toni Arthurs, MD     JH/MEDQ  D:  12/24/2015  T:  12/24/2015  Job:  [id number]

## 2015-12-24 NOTE — Progress Notes (Signed)
Assisted Dr. Carignan with left, ultrasound guided, popliteal block. Side rails up, monitors on throughout procedure. See vital signs in flow sheet. Tolerated Procedure well. 

## 2015-12-24 NOTE — H&P (Signed)
Kyle Kennedy is an 45 y.o. male.   Chief Complaint: left foot pain HPI: 45 y/o male with PMH of multitrauma in the remote past.  He has painful hammertoe deformities of the left 4th and 5th toes.  He presents now for operative treatment of the left 4th and 5th toes.  Past Medical History  Diagnosis Date  . Depression   . GERD (gastroesophageal reflux disease)   . Allergy   . 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 Gout   . Osteoarthritis   . Hiatal hernia   . Night sweats   . Fatigue   . Wears dentures   . Hypertension   . Hyperlipidemia   . Cough   . Loss of appetite   . Blood transfusion   . Double vision   . Knee pain     right  . 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     right  . Peptic stricture of esophagus   . Shortness of breath     with exertion  . Anxiety     Past Surgical History  Procedure Laterality Date  . Esophageal dilation    . Toe surgery      left  . Hernia repair    . Eye surgery    . Appendectomy    . Splenectomy, total  1982  . Replacement total knee  10/2013    right knee  . Joint replacement    . Cholecystectomy      Family History  Problem Relation Age of Onset  . Hypertension Mother   . Hypertension Father   . Hyperlipidemia Father   . Heart disease Father     MI's  . Anxiety disorder Brother   . OCD Brother   . Hypertension Brother   . Hyperlipidemia Mother   . Hyperlipidemia Brother   . Diabetes Maternal Grandmother   . Stroke Maternal Grandmother    Social History:  reports that he has never smoked. He has never used smokeless tobacco. He reports that he drinks alcohol. He reports that he uses illicit drugs (Marijuana).  Allergies: No Known Allergies  Medications Prior to Admission  Medication Sig Dispense Refill  . allopurinol (ZYLOPRIM) 100 MG tablet Take 1 tablet (100 mg total) by mouth daily. 90 tablet 1  . allopurinol (ZYLOPRIM) 100 MG tablet TAKE 1 TABLET BY MOUTH DAILY 90 tablet 0  .  amLODipine (NORVASC) 10 MG tablet Take 1 tablet (10 mg total) by mouth daily. 90 tablet 1  . buPROPion (WELLBUTRIN XL) 150 MG 24 hr tablet Take 1 tablet (150 mg total) by mouth daily. 90 tablet 1  . esomeprazole (NEXIUM) 40 MG capsule TAKE 1 CAPSULE BY MOUTH TWICE DAILY 60 capsule 11  . fluticasone (FLONASE) 50 MCG/ACT nasal spray INSTILL 2 SPRAYS IN EACH NOSTRIL EVERY DAY. 16 g 11  . gabapentin (NEURONTIN) 600 MG tablet TAKE 1 AND 1/2 TABLETS(900 MG) BY MOUTH AT BEDTIME 135 tablet 0  . HYDROcodone-acetaminophen (NORCO/VICODIN) 5-325 MG per tablet Take 1 tablet by mouth every 6 (six) hours as needed for pain.    Marland Kitchen. levocetirizine (XYZAL) 5 MG tablet TAKE 1 TABLET BY MOUTH EVERY EVENING 30 tablet 11  . lisinopril (PRINIVIL,ZESTRIL) 10 MG tablet Take 1 tablet (10 mg total) by mouth daily. 90 tablet 2  . Multiple Vitamin (MULTI-VITAMIN DAILY PO) Take by mouth.    . niacin (NIASPAN) 500 MG CR tablet TAKE 1 TABLET BY MOUTH AT BEDTIME 90  tablet 3  . niacin (NIASPAN) 500 MG CR tablet TAKE 1 TABLET BY MOUTH EVERY NIGHT AT BEDTIME 90 tablet 0  . niacin (NIASPAN) 500 MG CR tablet TAKE 1 TABLET BY MOUTH EVERY NIGHT AT BEDTIME 90 tablet 0  . oxyCODONE (OXY IR/ROXICODONE) 5 MG immediate release tablet     . simvastatin (ZOCOR) 20 MG tablet Take 1 tablet (20 mg total) by mouth at bedtime. 90 tablet 3  . traMADol (ULTRAM) 50 MG tablet     . venlafaxine (EFFEXOR) 75 MG tablet Take 1 tablet (75 mg total) by mouth 2 (two) times daily. 180 tablet 1  . venlafaxine (EFFEXOR) 75 MG tablet TAKE 1 TABLET BY MOUTH TWICE DAILY 180 tablet 0  . aspirin 81 MG tablet Take 1 tablet (81 mg total) by mouth daily. 30 tablet 11    No results found for this or any previous visit (from the past 48 hour(s)). No results found.  ROS  No recent f/c/nv/wt loss  Blood pressure 146/101, pulse 91, temperature 98.4 F (36.9 C), temperature source Oral, resp. rate 17, height  (1.702 m), weight 116.121 kg (256 lb), SpO2 100  %. Physical Exam  wn wd male in nad.  A and O x4.  Mood and affect normal.  EOMI.  resp unlabored.  L foot with fixed pf contractures of the 4th and 5th toes.  Skin healthy except callouses at the tips of the affected toes.  BNo lymphadenopathy.  5/5 strength in PF of the ankle and toes.  sens to LT intact at the foot.  Assessment/Plan L 4th and 5th hammertoe deformities - to OR for left 4th and 5th hammertoe corrections.  The risks and benefits of the alternative treatment options have been discussed in detail.  The patient wishes to proceed with surgery and specifically understands risks of bleeding, infection, nerve damage, blood clots, need for additional surgery, amputation and death.   Toni Arthurs, MD 01/12/2016, 10:13 AM

## 2015-12-24 NOTE — Transfer of Care (Signed)
Immediate Anesthesia Transfer of Care Note  Patient: Kyle HaffRussell A Chaudoin  Procedure(s) Performed: Procedure(s): LEFT FOURTH AND FIFTH HAMMER TOE CORRECTION AND FLEXOR TENDON RELEASES  (Left)  Patient Location: PACU  Anesthesia Type:GA combined with regional for post-op pain  Level of Consciousness: awake and patient cooperative  Airway & Oxygen Therapy: Patient Spontanous Breathing and Patient connected to face mask oxygen  Post-op Assessment: Report given to RN and Post -op Vital signs reviewed and stable  Post vital signs: Reviewed and stable  Last Vitals:  Filed Vitals:   12/24/15 0935 12/24/15 0940  BP: 131/87 146/101  Pulse: 83 91  Temp:    Resp: 12 17    Complications: No apparent anesthesia complications

## 2015-12-24 NOTE — Brief Op Note (Signed)
12/24/2015  11:25 AM  PATIENT:  Kyle Kennedy  45 y.o. male  PRE-OPERATIVE DIAGNOSIS:  left fourth and fifth hammertoes  POST-OPERATIVE DIAGNOSIS:  left fourth and fifth hammertoes  Procedure(s): 1.  Percutaneous flexor tendon release of the left 4th and 5th toes through separate incisions 2.  Left 4th hammertoe correction 3.  Left 5th hammertoe correction 4.  Left foot AP and lateral xrays   SURGEON:  Toni ArthursJohn Tayia Stonesifer, MD  ASSISTANT:  Alfredo MartinezJustin Ollis, PA-C  ANESTHESIA:   General, regional  EBL:  minimal   TOURNIQUET:   Total Tourniquet Time Documented: Thigh (Left) - 29 minutes Total: Thigh (Left) - 29 minutes  COMPLICATIONS:  None apparent  DISPOSITION:  Extubated, awake and stable to recovery.  DICTATION ID:  [id number]

## 2015-12-24 NOTE — Discharge Instructions (Addendum)
Kyle Hewitt, MD °[facility] ° °Please read the following information regarding your care after surgery. ° °Medications  °You only need a prescription for the narcotic pain medicine (ex. oxycodone, Percocet, Norco).  All of the other medicines listed below are available over the counter. °X acetominophen (Tylenol) 650 mg every 4-6 hours as you need for minor pain °X oxycodone as prescribed for moderate to severe pain °?  ° °Narcotic pain medicine (ex. oxycodone, Percocet, Vicodin) will cause constipation.  To prevent this problem, take the following medicines while you are taking any pain medicine. °X docusate sodium (Colace) 100 mg twice a day X senna (Senokot) 2 tablets twice a day ° ° °Weight Bearing °X Bear weight when you are able on your operated leg or foot in flat post-op shoe. ° °Dressing °X Keep your dressing clean and dry.  Don’t put anything (coat hanger, pencil, etc) down inside of it.  If it gets damp, use a hair dryer on the cool setting to dry it.  If it gets soaked, call the office to schedule an appointment for a cast change. ° ° °After your dressing, cast or splint is removed; you may shower, but do not soak or scrub the wound.  Allow the water to run over it, and then gently pat it dry. ° °Swelling °It is normal for you to have swelling where you had surgery.  To reduce swelling and pain, keep your toes above your nose for at least 3 days after surgery.  It may be necessary to keep your foot or leg elevated for several weeks.  If it hurts, it should be elevated. ° °Follow Up °Call my office at [phone] when you are discharged from the hospital or surgery center to schedule an appointment to be seen two weeks after surgery. ° °Call my office at [phone] if you develop a fever >101.5° F, nausea, vomiting, bleeding from the surgical site or severe pain.   ° °Regional Anesthesia Blocks ° °1. Numbness or the inability to move the "blocked" extremity may last from 3-48 hours after  placement. The length of time depends on the medication injected and your individual response to the medication. If the numbness is not going away after 48 hours, call your surgeon. ° °2. The extremity that is blocked will need to be protected until the numbness is gone and the  Strength has returned. Because you cannot feel it, you will need to take extra care to avoid injury. Because it may be weak, you may have difficulty moving it or using it. You may not know what position it is in without looking at it while the block is in effect. ° °3. For blocks in the legs and feet, returning to weight bearing and walking needs to be done carefully. You will need to wait until the numbness is entirely gone and the strength has returned. You should be able to move your leg and foot normally before you try and bear weight or walk. You will need someone to be with you when you first try to ensure you do not fall and possibly risk injury. ° °4. Bruising and tenderness at the needle site are common side effects and will resolve in a few days. ° °5. Persistent numbness or new problems with movement should be communicated to the surgeon or the [facility] ([phone])/  Surgery Center ([phone]). ° °Post Anesthesia Home Care Instructions ° °Activity: °Get plenty of rest for the remainder of the day. A   responsible adult should stay with you for 24 hours following the procedure.  For the next 24 hours, DO NOT: -Drive a car -Advertising copywriterperate machinery -Drink alcoholic beverages -Take any medication unless instructed by your physician -Make any legal decisions or sign important papers.  Meals: Start with liquid foods such as gelatin or soup. Progress to regular foods as tolerated. Avoid greasy, spicy, heavy foods. If nausea and/or vomiting occur, drink only clear liquids until the nausea and/or vomiting subsides. Call your physician if vomiting continues.  Special Instructions/Symptoms: Your throat may  feel dry or sore from the anesthesia or the breathing tube placed in your throat during surgery. If this causes discomfort, gargle with warm salt water. The discomfort should disappear within 24 hours.  If you had a scopolamine patch placed behind your ear for the management of post- operative nausea and/or vomiting:  1. The medication in the patch is effective for 72 hours, after which it should be removed.  Wrap patch in a tissue and discard in the trash. Wash hands thoroughly with soap and water. 2. You may remove the patch earlier than 72 hours if you experience unpleasant side effects which may include dry mouth, dizziness or visual disturbances. 3. Avoid touching the patch. Wash your hands with soap and water after contact with the patch.

## 2015-12-24 NOTE — Anesthesia Procedure Notes (Addendum)
Anesthesia Regional Block:  Popliteal block  Pre-Anesthetic Checklist: ,, timeout performed, Correct Patient, Correct Site, Correct Laterality, Correct Procedure, Correct Position, site marked, Risks and benefits discussed,  Surgical consent,  Pre-op evaluation,  At surgeon's request and post-op pain management  Laterality: Lower and Left  Prep: Maximum Sterile Barrier Precautions used and chloraprep       Needles:  Injection technique: Single-shot  Needle Type: Echogenic Stimulator Needle     Needle Length: 10cm 10 cm Needle Gauge: 21 and 21 G    Additional Needles:  Procedures: ultrasound guided (picture in chart) and nerve stimulator Popliteal block Narrative:  Injection made incrementally with aspirations every 5 mL.  Performed by: Personally   Additional Notes: Patient tolerated the procedure well without complications   Procedure Name: LMA Insertion Date/Time: 12/24/2015 10:39 AM Performed by: Yaire Kreher D Pre-anesthesia Checklist: Patient identified, Emergency Drugs available, Suction available and Patient being monitored Patient Re-evaluated:Patient Re-evaluated prior to inductionOxygen Delivery Method: Circle System Utilized Preoxygenation: Pre-oxygenation with 100% oxygen Intubation Type: IV induction Ventilation: Mask ventilation without difficulty LMA: LMA inserted LMA Size: 4.0 Number of attempts: 1 Airway Equipment and Method: Bite block Placement Confirmation: positive ETCO2 Tube secured with: Tape Dental Injury: Teeth and Oropharynx as per pre-operative assessment

## 2015-12-25 ENCOUNTER — Encounter (HOSPITAL_BASED_OUTPATIENT_CLINIC_OR_DEPARTMENT_OTHER): Payer: Self-pay | Admitting: Orthopedic Surgery

## 2015-12-29 ENCOUNTER — Ambulatory Visit (INDEPENDENT_AMBULATORY_CARE_PROVIDER_SITE_OTHER): Payer: Medicare Other | Admitting: Family Medicine

## 2015-12-29 ENCOUNTER — Encounter: Payer: Self-pay | Admitting: Family Medicine

## 2015-12-29 VITALS — BP 128/80 | HR 83 | Ht 67.0 in | Wt 258.0 lb

## 2015-12-29 DIAGNOSIS — M109 Gout, unspecified: Secondary | ICD-10-CM

## 2015-12-29 DIAGNOSIS — F329 Major depressive disorder, single episode, unspecified: Secondary | ICD-10-CM

## 2015-12-29 DIAGNOSIS — I10 Essential (primary) hypertension: Secondary | ICD-10-CM | POA: Diagnosis not present

## 2015-12-29 DIAGNOSIS — K2100 Gastro-esophageal reflux disease with esophagitis, without bleeding: Secondary | ICD-10-CM

## 2015-12-29 DIAGNOSIS — E785 Hyperlipidemia, unspecified: Secondary | ICD-10-CM

## 2015-12-29 DIAGNOSIS — F32A Depression, unspecified: Secondary | ICD-10-CM

## 2015-12-29 DIAGNOSIS — R7309 Other abnormal glucose: Secondary | ICD-10-CM | POA: Diagnosis not present

## 2015-12-29 LAB — COMPLETE METABOLIC PANEL WITH GFR
ALBUMIN: 4.5 g/dL (ref 3.6–5.1)
ALK PHOS: 109 U/L (ref 40–115)
ALT: 30 U/L (ref 9–46)
AST: 27 U/L (ref 10–40)
BILIRUBIN TOTAL: 0.4 mg/dL (ref 0.2–1.2)
BUN: 10 mg/dL (ref 7–25)
CO2: 23 mmol/L (ref 20–31)
CREATININE: 0.89 mg/dL (ref 0.60–1.35)
Calcium: 9.9 mg/dL (ref 8.6–10.3)
Chloride: 102 mmol/L (ref 98–110)
GFR, Est African American: >89 mL/min (ref >=60)
GFR, Est Non African American: >89 mL/min (ref >=60)
GLUCOSE: 131 mg/dL — AB (ref 65–99)
Potassium: 3.8 mmol/L (ref 3.5–5.3)
SODIUM: 137 mmol/L (ref 135–146)
TOTAL PROTEIN: 7.5 g/dL (ref 6.1–8.1)

## 2015-12-29 LAB — LIPID PANEL
Cholesterol: 139 mg/dL (ref 125–200)
HDL: 37 mg/dL — AB (ref >=40)
LDL CALC: 66 mg/dL (ref <130)
Total CHOL/HDL Ratio: 3.8 Ratio (ref <=5.0)
Triglycerides: 178 mg/dL — ABNORMAL HIGH (ref <150)
VLDL: 36 mg/dL — ABNORMAL HIGH (ref <30)

## 2015-12-29 LAB — URIC ACID: URIC ACID, SERUM: 6.1 mg/dL (ref 4.0–7.8)

## 2015-12-29 LAB — CBC
HCT: 45.1 % (ref 39.0–52.0)
Hemoglobin: 16.2 g/dL (ref 13.0–17.0)
MCH: 31 pg (ref 26.0–34.0)
MCHC: 35.9 g/dL (ref 30.0–36.0)
MCV: 86.2 fL (ref 78.0–100.0)
MPV: 10 fL (ref 8.6–12.4)
PLATELETS: 263 10*3/uL (ref 150–400)
RBC: 5.23 MIL/uL (ref 4.22–5.81)
RDW: 13.6 % (ref 11.5–15.5)
WBC: 6.9 10*3/uL (ref 4.0–10.5)

## 2015-12-29 LAB — POCT GLYCOSYLATED HEMOGLOBIN (HGB A1C): Hemoglobin A1C: 5.6

## 2015-12-29 MED ORDER — ESOMEPRAZOLE MAGNESIUM 40 MG PO CPDR: DELAYED_RELEASE_CAPSULE | ORAL | Status: DC

## 2015-12-29 MED ORDER — LEVOCETIRIZINE DIHYDROCHLORIDE 5 MG PO TABS: 5.0000 mg | ORAL_TABLET | Freq: Every evening | ORAL | Status: DC

## 2015-12-29 MED ORDER — NIACIN ER (ANTIHYPERLIPIDEMIC) 500 MG PO TBCR: EXTENDED_RELEASE_TABLET | ORAL | Status: DC

## 2015-12-29 MED ORDER — GABAPENTIN 600 MG PO TABS: ORAL_TABLET | ORAL | Status: DC

## 2015-12-29 NOTE — Assessment & Plan Note (Signed)
Reports symptoms well-controlled with Effexor 75 mg twice a day and Wellbutrin 150 daily - No longer follows up with psychiatry

## 2015-12-29 NOTE — Assessment & Plan Note (Signed)
Recheck lipid panel - Continue Zocor 20 mg daily at bedtime and niacin 500 mg daily

## 2015-12-29 NOTE — Assessment & Plan Note (Signed)
Blood pressure well controlled today - Continue lisinopril 10 mg daily, Norvasc 10 mg daily - Continue ASA 81 mg primary prevention - Check BMP, CBC

## 2015-12-29 NOTE — Progress Notes (Signed)
  Patient name: Kyle Kennedy Prude MRN [id number]  Date of birth: 19-Apr-1971  CC & HPI:  Kyle Kennedy Kreiter is Kennedy 45 y.o. male presenting today for HTN, GERD, HLD, Hx of gout, depression.   CHRONIC HYPERTENSION  BP Readings from Last 3 Encounters:  12/29/15 128/80  12/24/15 124/72  08/18/14 122/83    Control: great Disease Monitoring  Blood pressure range outside clinc: no  Chest pain: no   Dyspnea: no   Claudication: no  Medication compliance: yes  Medication Side Effects: no Dizziness/lightheadedness; cough, angioedema,lightheadedness, rash    Hyperlipidemia  Medication Compliance: yes  Side Effects?: no muscle pain or weakness, RUQ pain; jaundice  Anxiety, depression - He reports mood is well controlled on his current regimen - Often gets mood swings and anger when he misses doses - No longer followed by psychiatry  History of gout - He denies any acute attacks in the past year - 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 - He is unsure of allopurinol providing any benefit  GERD - History of esophageal strictures requiring repeat dilation - Followed by gastroenterology, who recommends continuous PPI - Smokes hemp  Elevated Glucose - Denies excessive urination or thirst - Family history of diabetes  Smoking History Noted  Objective Findings:  Vitals: BP 128/80 mmHg  Pulse 83  Ht 5\' 7"  (1.702 m)  Wt 258 lb (117.028 kg)  BMI 40.40 kg/m2  Gen: NAD; Obese CV: RRR w/o m/r/g, pulses +2 b/l Resp: CTAB w/ normal respiratory effort  Assessment & Plan:   Hypertension Blood pressure well controlled today - Continue lisinopril 10 mg daily, Norvasc 10 mg daily - Continue ASA 81 mg primary prevention - Check BMP, CBC  GERD (gastroesophageal reflux disease) Continuous PPI recommended by gastroenterologist due to esophageal strictures - Refill Nexium 40 mg twice Kennedy day  Hyperlipidemia Recheck lipid panel - Continue Zocor 20 mg daily at bedtime and niacin 500 mg  daily  Gout Denies any attacks in the past here.  Reports previous joint aspiration showed pseudogout - Discontinue allopurinol - Advised if he develops acute onset of joint pain or swelling  Depression Reports symptoms well-controlled with Effexor 75 mg twice Kennedy day and Wellbutrin 150 daily - No longer follows up with psychiatry

## 2015-12-29 NOTE — Patient Instructions (Signed)
It was great seeing you today.   I have order some labs today to check your kidney function. I will send you a letter with the results, or call you if we need to make any changes to your current therapies.  Stop allopurinol. Call if your get new swollen painful joints   Please bring all your medications to every doctors visit  Sign up for My Chart to have easy access to your labs results, and communication with your Primary care physician.  Next Appointment  Please call to make an appointment with Dr Gayla DossJoyner in 6 months   I look forward to talking with you again at our next visit. If you have any questions or concerns before then, please call the clinic at [phone].  Take Care,   Dr Wenda LowJames Zarrah Loveland

## 2015-12-29 NOTE — Assessment & Plan Note (Signed)
Continuous PPI recommended by gastroenterologist due to esophageal strictures - Refill Nexium 40 mg twice a day

## 2015-12-29 NOTE — Assessment & Plan Note (Signed)
Denies any attacks in the past here.  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

## 2015-12-30 ENCOUNTER — Encounter: Payer: Self-pay | Admitting: Family Medicine

## 2016-01-08 DIAGNOSIS — Z4789 Encounter for other orthopedic aftercare: Secondary | ICD-10-CM | POA: Diagnosis not present

## 2016-01-13 ENCOUNTER — Encounter: Payer: Self-pay | Admitting: Family Medicine

## 2016-02-05 DIAGNOSIS — Z4789 Encounter for other orthopedic aftercare: Secondary | ICD-10-CM | POA: Diagnosis not present

## 2016-02-07 ENCOUNTER — Other Ambulatory Visit: Payer: Self-pay | Admitting: Family Medicine

## 2016-02-08 ENCOUNTER — Other Ambulatory Visit: Payer: Self-pay | Admitting: Family Medicine

## 2016-02-22 ENCOUNTER — Other Ambulatory Visit: Payer: Self-pay | Admitting: Family Medicine

## 2016-02-24 NOTE — Telephone Encounter (Signed)
2nd request.  Martin, Tamika L, RN  

## 2016-03-21 ENCOUNTER — Other Ambulatory Visit: Payer: Self-pay | Admitting: Family Medicine

## 2016-06-12 ENCOUNTER — Other Ambulatory Visit: Payer: Self-pay | Admitting: Family Medicine

## 2016-06-13 ENCOUNTER — Other Ambulatory Visit: Payer: Self-pay | Admitting: Family Medicine

## 2016-06-13 DIAGNOSIS — F32A Depression, unspecified: Secondary | ICD-10-CM

## 2016-06-13 DIAGNOSIS — M109 Gout, unspecified: Secondary | ICD-10-CM

## 2016-06-13 DIAGNOSIS — F329 Major depressive disorder, single episode, unspecified: Secondary | ICD-10-CM

## 2016-06-13 NOTE — Telephone Encounter (Signed)
Medication refilled

## 2016-08-03 ENCOUNTER — Other Ambulatory Visit: Payer: Self-pay | Admitting: Family Medicine

## 2016-08-10 ENCOUNTER — Ambulatory Visit (INDEPENDENT_AMBULATORY_CARE_PROVIDER_SITE_OTHER): Payer: Medicare Other | Admitting: Gastroenterology

## 2016-08-10 ENCOUNTER — Encounter: Payer: Self-pay | Admitting: Gastroenterology

## 2016-08-10 VITALS — BP 120/80 | HR 99 | Ht 67.0 in | Wt 264.2 lb

## 2016-08-10 DIAGNOSIS — K219 Gastro-esophageal reflux disease without esophagitis: Secondary | ICD-10-CM

## 2016-08-10 DIAGNOSIS — R1310 Dysphagia, unspecified: Secondary | ICD-10-CM

## 2016-08-10 DIAGNOSIS — R1319 Other dysphagia: Secondary | ICD-10-CM

## 2016-08-10 NOTE — Patient Instructions (Signed)
You have been scheduled for an endoscopy. Please follow written instructions given to you at your visit today. If you use inhalers (even only as needed), please bring them with you on the day of your procedure. Your physician has requested that you go to www.startemmi.com and enter the access code given to you at your visit today. This web site gives a general overview about your procedure. However, you should still follow specific instructions given to you by our office regarding your preparation for the procedure.  Normal BMI (Body Mass Index- based on height and weight) is between 19 and 25. Your BMI today is Body mass index is 41.39 kg/m. Marland Kitchen. Please consider follow up  regarding your BMI with your Primary Care Provider.  Thank you for choosing me and [facility].  Venita LickMalcolm T. Pleas KochStark, Jr., MD., Clementeen GrahamFACG

## 2016-08-10 NOTE — Progress Notes (Signed)
    History of Present Illness: This is a 45 year old male with a history of GERD and esophageal strictures. He notes worsening solid food dysphagia over the past 6-9 months. He is accompanied by his wife. He states he is generally compliant with taking esomeprazole twice daily. His last EGD was in March 2015 at EGD junction stricture was dilated to 16 mm.  Current Medications, Allergies, Past Medical History, Past Surgical History, Family History and Social History were reviewed in [facility] electronic medical record.  Physical Exam: General: Well developed, well nourished, no acute distress Head: Normocephalic and atraumatic Eyes:  sclerae anicteric, EOMI Ears: Normal auditory acuity Mouth: No deformity or lesions Lungs: Clear throughout to auscultation Heart: Regular rate and rhythm; no murmurs, rubs or bruits Abdomen: Soft, non tender and non distended. No masses, hepatosplenomegaly or hernias noted. Normal Bowel sounds Musculoskeletal: Symmetrical with no gross deformities  Pulses:  Normal pulses noted Extremities: No clubbing, cyanosis, edema or deformities noted Neurological: Alert oriented x 4, grossly nonfocal Psychological:  Alert and cooperative. Normal mood and affect  Assessment and Recommendations:  1. GERD with a history of esophageal strictures. Recurrent dysphagia. Presumed recurrent esophageal stricture. Continue esomeprazole 40 mg twice daily and standard antireflux measures. Schedule EGD with dilation. The risks (including bleeding, perforation, infection, missed lesions, medication reactions and possible hospitalization or surgery if complications occur), benefits, and alternatives to endoscopy with possible biopsy and possible dilation were discussed with the patient and they consent to proceed.

## 2016-08-15 DIAGNOSIS — Z23 Encounter for immunization: Secondary | ICD-10-CM | POA: Diagnosis not present

## 2016-08-16 ENCOUNTER — Other Ambulatory Visit: Payer: Self-pay | Admitting: Family Medicine

## 2016-08-31 ENCOUNTER — Ambulatory Visit (AMBULATORY_SURGERY_CENTER): Payer: Medicare Other | Admitting: Gastroenterology

## 2016-08-31 ENCOUNTER — Encounter: Payer: Self-pay | Admitting: Gastroenterology

## 2016-08-31 VITALS — BP 144/89 | HR 85 | Temp 97.3°F | Resp 12 | Ht 67.0 in | Wt 264.0 lb

## 2016-08-31 DIAGNOSIS — K222 Esophageal obstruction: Secondary | ICD-10-CM

## 2016-08-31 DIAGNOSIS — R1310 Dysphagia, unspecified: Secondary | ICD-10-CM | POA: Diagnosis not present

## 2016-08-31 DIAGNOSIS — I10 Essential (primary) hypertension: Secondary | ICD-10-CM | POA: Diagnosis not present

## 2016-08-31 MED ORDER — SODIUM CHLORIDE 0.9 % IV SOLN: 500.0000 mL | INTRAVENOUS | Status: DC

## 2016-08-31 NOTE — Patient Instructions (Signed)
YOU HAD AN ENDOSCOPIC PROCEDURE TODAY AT THE [facility]:   Refer to the procedure report that was given to you for any specific questions about what was found during the examination.  If the procedure report does not answer your questions, please call your gastroenterologist to clarify.  If you requested that your care partner not be given the details of your procedure findings, then the procedure report has been included in a sealed envelope for you to review at your convenience later.  YOU SHOULD EXPECT: Some feelings of bloating in the abdomen. Passage of more gas than usual.  Walking can help get rid of the air that was put into your GI tract during the procedure and reduce the bloating. If you had a lower endoscopy (such as a colonoscopy or flexible sigmoidoscopy) you may notice spotting of blood in your stool or on the toilet paper. If you underwent a bowel prep for your procedure, you may not have a normal bowel movement for a few days.  Please Note:  You might notice some irritation and congestion in your nose or some drainage.  This is from the oxygen used during your procedure.  There is no need for concern and it should clear up in a day or so.  SYMPTOMS TO REPORT IMMEDIATELY:    Following upper endoscopy (EGD)  Vomiting of blood or coffee ground material  New chest pain or pain under the shoulder blades  Painful or persistently difficult swallowing  New shortness of breath  Fever of 100F or higher  Black, tarry-looking stools  For urgent or emergent issues, a gastroenterologist can be reached at any hour by calling [phone].   DIET:  We do recommend a small meal at first, but then you may proceed to your regular diet.  Drink plenty of fluids but you should avoid alcoholic beverages for 24 hours.  ACTIVITY:  You should plan to take it easy for the rest of today and you should NOT DRIVE or use heavy machinery until tomorrow (because of the sedation medicines used  during the test).    FOLLOW UP: Our staff will call the number listed on your records the next business day following your procedure to check on you and address any questions or concerns that you may have regarding the information given to you following your procedure. If we do not reach you, we will leave a message.  However, if you are feeling well and you are not experiencing any problems, there is no need to return our call.  We will assume that you have returned to your regular daily activities without incident.  If any biopsies were taken you will be contacted by phone or by letter within the next 1-3 weeks.  Please call us at [phone] if you have not heard about the biopsies in 3 weeks.    SIGNATURES/CONFIDENTIALITY: You and/or your care partner have signed paperwork which will be entered into your electronic medical record.  These signatures attest to the fact that that the information above on your After Visit Summary has been reviewed and is understood.  Full responsibility of the confidentiality of this discharge information lies with you and/or your care-partner.  [place] and after dilation diet given.  Return to GI office in 2-3 months.

## 2016-08-31 NOTE — Progress Notes (Signed)
To recovery, report to Scott, RN, VSS 

## 2016-08-31 NOTE — Op Note (Signed)
[facility] Patient Name: Kyle Kennedy Procedure Date: 08/31/2016 10:06 AM MRN: [id number] Endoscopist: Meryl DareMalcolm T Reggie Welge , MD Age: [age] Referring MD:  Date of Birth: Nov 20, 1970 Gender: Male Account #: [id number] Procedure:                Upper GI endoscopy Indications:              Dysphagia Medicines:                Monitored Anesthesia Care Procedure:                Pre-Anesthesia Assessment:                           - Prior to the procedure, a History and Physical                            was performed, and patient medications and                            allergies were reviewed. The patient's tolerance of                            previous anesthesia was also reviewed. The risks                            and benefits of the procedure and the sedation                            options and risks were discussed with the patient.                            All questions were answered, and informed consent                            was obtained. Prior Anticoagulants: The patient has                            taken no previous anticoagulant or antiplatelet                            agents. ASA Grade Assessment: III - A patient with                            severe systemic disease. After reviewing the risks                            and benefits, the patient was deemed in                            satisfactory condition to undergo the procedure.                           After obtaining informed consent, the endoscope was  passed under direct vision. Throughout the                            procedure, the patient's blood pressure, pulse, and                            oxygen saturations were monitored continuously. The                            Model GIF-[id number]) scope was introduced                            through the mouth, and advanced to the second part                            of duodenum. The upper GI endoscopy was                             accomplished without difficulty. The patient                            tolerated the procedure well. Scope In: Scope Out: Findings:                 One mild benign-appearing, intrinsic stenosis was                            found at the gastroesophageal junction. This                            measured 1.2 cm (inner diameter) and was traversed.                            A guidewire was placed and the scope was withdrawn.                            Dilation was performed with Savary dilator with                            mild resistance at 14 mm, 15 mm, 16 mm. Estimated                            blood loss: none.                           The exam of the esophagus was otherwise normal.                           The entire examined stomach was normal.                           The duodenal bulb and second portion of the                            duodenum  were normal. Complications:            No immediate complications. Estimated Blood Loss:     Estimated blood loss: none. Impression:               - Benign-appearing esophageal stenosis. Dilated.                           - Normal stomach.                           - Normal duodenal bulb and second portion of the                            duodenum.                           - No specimens collected. Recommendation:           - Patient has a contact number available for                            emergencies. The signs and symptoms of potential                            delayed complications were discussed with the                            patient. Return to normal activities tomorrow.                            Written discharge instructions were provided to the                            patient.                           - Clear liquid diet - advance as tolerated to soft                            diet today.                           - Continue present medications.                           -  Return to GI office in 2-3 months. Meryl DareMalcolm T Allycia Pitz, MD 08/31/2016 10:28:20 AM This report has been signed electronically.

## 2016-08-31 NOTE — Progress Notes (Signed)
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 Patient stating he wears his dark glasses most of the time.

## 2016-08-31 NOTE — Progress Notes (Signed)
Called to room to assist during endoscopic procedure.  Patient ID and intended procedure confirmed with present staff. Received instructions for my participation in the procedure from the performing physician.  

## 2016-09-01 ENCOUNTER — Telehealth: Payer: Self-pay

## 2016-09-01 NOTE — Telephone Encounter (Signed)
  Follow up Call-  Call back number 08/31/2016 01/03/2014  Post procedure Call Back phone  # [phone]  Permission to leave phone message Yes Yes  Some recent data might be hidden     Patient questions:  Do you have a fever, pain , or abdominal swelling? No. Pain Score  0 *  Have you tolerated food without any problems? Yes.    Have you been able to return to your normal activities? Yes.    Do you have any questions about your discharge instructions: Diet   No. Medications  No. Follow up visit  No.  Do you have questions or concerns about your Care? No.  Actions: * If pain score is 4 or above: No action needed, pain <4.  Pt reported no problems or complaints. maw

## 2016-09-06 DIAGNOSIS — Z96651 Presence of right artificial knee joint: Secondary | ICD-10-CM | POA: Diagnosis not present

## 2016-09-10 ENCOUNTER — Other Ambulatory Visit: Payer: Self-pay | Admitting: Family Medicine

## 2016-09-10 ENCOUNTER — Other Ambulatory Visit: Payer: Self-pay | Admitting: Internal Medicine

## 2016-09-10 DIAGNOSIS — I10 Essential (primary) hypertension: Secondary | ICD-10-CM

## 2016-09-10 DIAGNOSIS — F32A Depression, unspecified: Secondary | ICD-10-CM

## 2016-09-10 DIAGNOSIS — M109 Gout, unspecified: Secondary | ICD-10-CM

## 2016-09-10 DIAGNOSIS — F329 Major depressive disorder, single episode, unspecified: Secondary | ICD-10-CM

## 2016-09-24 ENCOUNTER — Other Ambulatory Visit: Payer: Self-pay | Admitting: Family Medicine

## 2016-09-26 ENCOUNTER — Other Ambulatory Visit: Payer: Self-pay | Admitting: Internal Medicine

## 2016-10-25 ENCOUNTER — Other Ambulatory Visit: Payer: Self-pay | Admitting: Internal Medicine

## 2016-11-12 ENCOUNTER — Other Ambulatory Visit: Payer: Self-pay | Admitting: Internal Medicine

## 2016-11-22 ENCOUNTER — Telehealth: Payer: Self-pay | Admitting: *Deleted

## 2016-11-22 NOTE — Telephone Encounter (Signed)
[facility] 40 [facility]. PA completed online at www.covermymeds.com. PA approved via [employer] until 11/22/17 for quantity limit.  Clovis PuMartin, Siobahn Worsley L, RN

## 2016-11-23 ENCOUNTER — Other Ambulatory Visit: Payer: Self-pay | Admitting: Internal Medicine

## 2016-12-08 ENCOUNTER — Other Ambulatory Visit: Payer: Self-pay | Admitting: Internal Medicine

## 2016-12-08 DIAGNOSIS — M109 Gout, unspecified: Secondary | ICD-10-CM

## 2016-12-08 DIAGNOSIS — F329 Major depressive disorder, single episode, unspecified: Secondary | ICD-10-CM

## 2016-12-08 DIAGNOSIS — F32A Depression, unspecified: Secondary | ICD-10-CM

## 2016-12-09 ENCOUNTER — Other Ambulatory Visit: Payer: Self-pay | Admitting: Internal Medicine

## 2016-12-09 DIAGNOSIS — I10 Essential (primary) hypertension: Secondary | ICD-10-CM

## 2016-12-20 ENCOUNTER — Other Ambulatory Visit: Payer: Self-pay | Admitting: Family Medicine

## 2016-12-20 DIAGNOSIS — I10 Essential (primary) hypertension: Secondary | ICD-10-CM

## 2016-12-22 ENCOUNTER — Other Ambulatory Visit: Payer: Self-pay | Admitting: Internal Medicine

## 2017-01-18 ENCOUNTER — Other Ambulatory Visit: Payer: Self-pay | Admitting: Family Medicine

## 2017-02-01 ENCOUNTER — Other Ambulatory Visit: Payer: Self-pay | Admitting: Family Medicine

## 2017-02-01 NOTE — Telephone Encounter (Signed)
Please schedule patient for a follow-up visit. He has not been seen since 12/2015. Thank you!

## 2017-02-02 NOTE — Telephone Encounter (Signed)
Pt contacted and informed of rx refill, pt scheduled for physical with PCP, 4/25.

## 2017-02-08 ENCOUNTER — Ambulatory Visit (INDEPENDENT_AMBULATORY_CARE_PROVIDER_SITE_OTHER): Payer: Medicare Other | Admitting: Internal Medicine

## 2017-02-08 ENCOUNTER — Encounter: Payer: Self-pay | Admitting: Internal Medicine

## 2017-02-08 VITALS — BP 136/82 | HR 83 | Temp 98.3°F | Wt 256.0 lb

## 2017-02-08 DIAGNOSIS — Z114 Encounter for screening for human immunodeficiency virus [HIV]: Secondary | ICD-10-CM

## 2017-02-08 DIAGNOSIS — Z0000 Encounter for general adult medical examination without abnormal findings: Secondary | ICD-10-CM | POA: Insufficient documentation

## 2017-02-08 DIAGNOSIS — I10 Essential (primary) hypertension: Secondary | ICD-10-CM | POA: Diagnosis not present

## 2017-02-08 NOTE — Patient Instructions (Signed)
It was so nice to meet you!  Everything was normal on your exam. I ordered an HIV lab and a basic metabolic panel to look at your kidneys and your electrolytes. Our office will call you with these results.  We will see you back in 1 year or earlier if needed.  -Dr. Nancy Marus

## 2017-02-08 NOTE — Assessment & Plan Note (Signed)
-   HIV screening performed today

## 2017-02-08 NOTE — Progress Notes (Signed)
   [facility] Phone: [phone]  Subjective:  Shown is a 46 year old male presenting to clinic for follow-up of hypertension. No chest pain, no shortness of breath, no lower extremity edema. Does not check his blood pressures at home. No side effects to medications.  ROS: See HPI for pertinent positives and negatives  Past Medical History- HTN, GERD, hx TBI, HLD, seasonal allergies.  Family history reviewed for today's visit. No changes.  Social history- uses marijuana, no tobacco use  Objective: BP 136/82   Pulse 83   Temp 98.3 F (36.8 C) (Oral)   Wt 256 lb (116.1 kg)   SpO2 99%   BMI 40.10 kg/m  Gen: NAD, alert, cooperative with exam HEENT: NCAT, EOMI, MMM, left lid droop (chronic) Neck: FROM, supple, no thyromegaly CV: RRR, no murmur Resp: CTABL, no wheezes, normal work of breathing GI: SNTND, BS present, no guarding or organomegaly Msk: No edema, warm, normal tone, moves UE/LE spontaneously Neuro: Alert and oriented, no gross deficits Skin: No rashes, no lesions Psych: Appropriate behavior  Assessment/Plan: HTN: Well-controlled. BP 136/82 in clinic today. Goal BP <140 / <90. - Continue Lisinopril  daily and Norvasc  daily - Check BMET today, as patient has not had this checked in over a year - Follow-up in 1 year or earlier if needed  Health Care Maintenance: - HIV screening performed today   Willadean Carol, MD PGY-2

## 2017-02-08 NOTE — Assessment & Plan Note (Signed)
Well-controlled. BP 136/82 in clinic today. Goal BP <140 / <90. - Continue Lisinopril  daily and Norvasc  daily - Check BMET today, as patient has not had this checked in over a year - Follow-up in 1 year or earlier if needed

## 2017-02-09 ENCOUNTER — Other Ambulatory Visit: Payer: Self-pay | Admitting: Internal Medicine

## 2017-02-09 ENCOUNTER — Telehealth: Payer: Self-pay

## 2017-02-09 LAB — BASIC METABOLIC PANEL
BUN/Creatinine Ratio: 6 — ABNORMAL LOW (ref 9–20)
BUN: 6 mg/dL (ref 6–24)
CHLORIDE: 100 mmol/L (ref 96–106)
CO2: 21 mmol/L (ref 18–29)
CREATININE: 0.99 mg/dL (ref 0.76–1.27)
Calcium: 9.8 mg/dL (ref 8.7–10.2)
GFR calc Af Amer: 104 mL/min/{1.73_m2} (ref >59)
GFR calc non Af Amer: 90 mL/min/{1.73_m2} (ref >59)
GLUCOSE: 151 mg/dL — AB (ref 65–99)
Potassium: 4.4 mmol/L (ref 3.5–5.2)
SODIUM: 139 mmol/L (ref 134–144)

## 2017-02-09 LAB — HIV ANTIBODY (ROUTINE TESTING W REFLEX): HIV Screen 4th Generation wRfx: NONREACTIVE

## 2017-02-09 NOTE — Telephone Encounter (Signed)
-----   Message from Campbell Stall, MD sent at 02/09/2017  8:45 AM EDT ----- Please let Mr. Kappes know that his labs were completely normal. Thank you!

## 2017-02-09 NOTE — Telephone Encounter (Signed)
Contacted pt and informed of normal lab results.

## 2017-02-14 ENCOUNTER — Other Ambulatory Visit: Payer: Self-pay | Admitting: Physician Assistant

## 2017-03-04 ENCOUNTER — Other Ambulatory Visit: Payer: Self-pay | Admitting: Family Medicine

## 2017-03-06 ENCOUNTER — Other Ambulatory Visit: Payer: Self-pay | Admitting: Internal Medicine

## 2017-03-06 DIAGNOSIS — I10 Essential (primary) hypertension: Secondary | ICD-10-CM

## 2017-03-09 ENCOUNTER — Other Ambulatory Visit: Payer: Self-pay | Admitting: Internal Medicine

## 2017-03-18 ENCOUNTER — Other Ambulatory Visit: Payer: Self-pay | Admitting: Internal Medicine

## 2017-03-18 DIAGNOSIS — I10 Essential (primary) hypertension: Secondary | ICD-10-CM

## 2017-04-04 ENCOUNTER — Other Ambulatory Visit: Payer: Self-pay | Admitting: Internal Medicine

## 2017-04-07 ENCOUNTER — Other Ambulatory Visit: Payer: Self-pay | Admitting: Internal Medicine

## 2017-04-30 ENCOUNTER — Encounter: Payer: Self-pay | Admitting: Internal Medicine

## 2017-04-30 ENCOUNTER — Other Ambulatory Visit: Payer: Self-pay | Admitting: Internal Medicine

## 2017-05-01 ENCOUNTER — Other Ambulatory Visit: Payer: Self-pay | Admitting: *Deleted

## 2017-05-01 MED ORDER — ESOMEPRAZOLE MAGNESIUM 40 MG PO CPDR: 40.0000 mg | DELAYED_RELEASE_CAPSULE | Freq: Two times a day (BID) | ORAL | 0 refills | Status: DC

## 2017-05-02 ENCOUNTER — Other Ambulatory Visit: Payer: Self-pay | Admitting: Internal Medicine

## 2017-05-02 ENCOUNTER — Encounter: Payer: Self-pay | Admitting: Internal Medicine

## 2017-05-02 DIAGNOSIS — F32A Depression, unspecified: Secondary | ICD-10-CM

## 2017-05-02 DIAGNOSIS — F329 Major depressive disorder, single episode, unspecified: Secondary | ICD-10-CM

## 2017-05-02 MED ORDER — VENLAFAXINE HCL 75 MG PO TABS: ORAL_TABLET | ORAL | 0 refills | Status: DC

## 2017-05-03 ENCOUNTER — Other Ambulatory Visit: Payer: Self-pay | Admitting: Internal Medicine

## 2017-05-18 ENCOUNTER — Other Ambulatory Visit: Payer: Self-pay | Admitting: Internal Medicine

## 2017-05-30 ENCOUNTER — Other Ambulatory Visit: Payer: Self-pay | Admitting: Internal Medicine

## 2017-06-02 ENCOUNTER — Other Ambulatory Visit: Payer: Self-pay | Admitting: Family Medicine

## 2017-06-02 ENCOUNTER — Other Ambulatory Visit: Payer: Self-pay | Admitting: Internal Medicine

## 2017-06-02 DIAGNOSIS — I10 Essential (primary) hypertension: Secondary | ICD-10-CM

## 2017-06-03 ENCOUNTER — Other Ambulatory Visit: Payer: Self-pay | Admitting: Internal Medicine

## 2017-06-05 ENCOUNTER — Other Ambulatory Visit: Payer: Self-pay | Admitting: Internal Medicine

## 2017-06-05 DIAGNOSIS — F32A Depression, unspecified: Secondary | ICD-10-CM

## 2017-06-05 DIAGNOSIS — F329 Major depressive disorder, single episode, unspecified: Secondary | ICD-10-CM

## 2017-06-11 ENCOUNTER — Other Ambulatory Visit: Payer: Self-pay | Admitting: Internal Medicine

## 2017-06-12 ENCOUNTER — Other Ambulatory Visit: Payer: Self-pay | Admitting: *Deleted

## 2017-06-12 MED ORDER — NIACIN ER (ANTIHYPERLIPIDEMIC) 500 MG PO TBCR: 500.0000 mg | EXTENDED_RELEASE_TABLET | Freq: Every day | ORAL | 0 refills | Status: DC

## 2017-06-15 ENCOUNTER — Other Ambulatory Visit: Payer: Self-pay | Admitting: Internal Medicine

## 2017-06-15 DIAGNOSIS — I10 Essential (primary) hypertension: Secondary | ICD-10-CM

## 2017-06-29 ENCOUNTER — Other Ambulatory Visit: Payer: Self-pay | Admitting: Internal Medicine

## 2017-07-02 ENCOUNTER — Other Ambulatory Visit: Payer: Self-pay | Admitting: Internal Medicine

## 2017-07-30 ENCOUNTER — Other Ambulatory Visit: Payer: Self-pay | Admitting: Internal Medicine

## 2017-08-04 DIAGNOSIS — Z23 Encounter for immunization: Secondary | ICD-10-CM | POA: Diagnosis not present

## 2017-08-14 ENCOUNTER — Other Ambulatory Visit: Payer: Self-pay | Admitting: Internal Medicine

## 2017-08-28 ENCOUNTER — Other Ambulatory Visit: Payer: Self-pay | Admitting: Internal Medicine

## 2017-08-28 DIAGNOSIS — I10 Essential (primary) hypertension: Secondary | ICD-10-CM

## 2017-08-31 ENCOUNTER — Other Ambulatory Visit: Payer: Self-pay | Admitting: Internal Medicine

## 2017-09-10 ENCOUNTER — Other Ambulatory Visit: Payer: Self-pay | Admitting: Internal Medicine

## 2017-09-10 DIAGNOSIS — I10 Essential (primary) hypertension: Secondary | ICD-10-CM

## 2017-09-25 ENCOUNTER — Other Ambulatory Visit: Payer: Self-pay | Admitting: Internal Medicine

## 2017-10-24 ENCOUNTER — Other Ambulatory Visit: Payer: Self-pay | Admitting: Internal Medicine

## 2017-10-25 ENCOUNTER — Other Ambulatory Visit: Payer: Self-pay | Admitting: Internal Medicine

## 2017-11-24 ENCOUNTER — Other Ambulatory Visit: Payer: Self-pay | Admitting: Internal Medicine

## 2017-11-24 DIAGNOSIS — I10 Essential (primary) hypertension: Secondary | ICD-10-CM

## 2017-11-27 ENCOUNTER — Other Ambulatory Visit: Payer: Self-pay | Admitting: Internal Medicine

## 2017-12-10 ENCOUNTER — Other Ambulatory Visit: Payer: Self-pay | Admitting: Internal Medicine

## 2017-12-10 DIAGNOSIS — I10 Essential (primary) hypertension: Secondary | ICD-10-CM

## 2017-12-19 DIAGNOSIS — Z96651 Presence of right artificial knee joint: Secondary | ICD-10-CM | POA: Diagnosis not present

## 2017-12-22 ENCOUNTER — Other Ambulatory Visit: Payer: Self-pay | Admitting: Internal Medicine

## 2017-12-29 ENCOUNTER — Other Ambulatory Visit: Payer: Self-pay | Admitting: Internal Medicine

## 2017-12-29 ENCOUNTER — Other Ambulatory Visit: Payer: Self-pay

## 2017-12-29 ENCOUNTER — Ambulatory Visit (INDEPENDENT_AMBULATORY_CARE_PROVIDER_SITE_OTHER): Payer: Medicare Other | Admitting: Internal Medicine

## 2017-12-29 ENCOUNTER — Encounter: Payer: Self-pay | Admitting: Internal Medicine

## 2017-12-29 VITALS — BP 122/82 | HR 80 | Temp 98.2°F | Wt 257.0 lb

## 2017-12-29 DIAGNOSIS — E785 Hyperlipidemia, unspecified: Secondary | ICD-10-CM | POA: Diagnosis not present

## 2017-12-29 DIAGNOSIS — Z131 Encounter for screening for diabetes mellitus: Secondary | ICD-10-CM

## 2017-12-29 DIAGNOSIS — I10 Essential (primary) hypertension: Secondary | ICD-10-CM

## 2017-12-29 DIAGNOSIS — M109 Gout, unspecified: Secondary | ICD-10-CM | POA: Diagnosis not present

## 2017-12-29 DIAGNOSIS — F3341 Major depressive disorder, recurrent, in partial remission: Secondary | ICD-10-CM | POA: Diagnosis not present

## 2017-12-29 DIAGNOSIS — F329 Major depressive disorder, single episode, unspecified: Secondary | ICD-10-CM | POA: Diagnosis not present

## 2017-12-29 DIAGNOSIS — F32A Depression, unspecified: Secondary | ICD-10-CM

## 2017-12-29 LAB — POCT GLYCOSYLATED HEMOGLOBIN (HGB A1C): HEMOGLOBIN A1C: 5.6

## 2017-12-29 MED ORDER — GABAPENTIN 600 MG PO TABS: 600.0000 mg | ORAL_TABLET | Freq: Every day | ORAL | 3 refills | Status: DC | PRN

## 2017-12-29 MED ORDER — ESOMEPRAZOLE MAGNESIUM 40 MG PO CPDR: 40.0000 mg | DELAYED_RELEASE_CAPSULE | Freq: Two times a day (BID) | ORAL | 0 refills | Status: DC

## 2017-12-29 MED ORDER — FLUTICASONE PROPIONATE 50 MCG/ACT NA SUSP: NASAL | 0 refills | Status: DC

## 2017-12-29 MED ORDER — VENLAFAXINE HCL 75 MG PO TABS: 75.0000 mg | ORAL_TABLET | Freq: Two times a day (BID) | ORAL | 0 refills | Status: DC

## 2017-12-29 MED ORDER — BUPROPION HCL ER (XL) 150 MG PO TB24: 150.0000 mg | ORAL_TABLET | Freq: Every day | ORAL | 1 refills | Status: DC

## 2017-12-29 MED ORDER — LEVOCETIRIZINE DIHYDROCHLORIDE 5 MG PO TABS: 5.0000 mg | ORAL_TABLET | Freq: Every evening | ORAL | 3 refills | Status: DC

## 2017-12-29 MED ORDER — AMLODIPINE BESYLATE 10 MG PO TABS: 10.0000 mg | ORAL_TABLET | Freq: Every day | ORAL | 0 refills | Status: DC

## 2017-12-29 MED ORDER — ALLOPURINOL 100 MG PO TABS: ORAL_TABLET | ORAL | 0 refills | Status: DC

## 2017-12-29 MED ORDER — SIMVASTATIN 20 MG PO TABS: 20.0000 mg | ORAL_TABLET | Freq: Every day | ORAL | 0 refills | Status: DC

## 2017-12-29 MED ORDER — LISINOPRIL 10 MG PO TABS: 10.0000 mg | ORAL_TABLET | Freq: Every day | ORAL | 0 refills | Status: DC

## 2017-12-29 NOTE — Addendum Note (Signed)
Addended by: Henri MedalHARTSELL, Markale Birdsell M on: 12/29/2017 10:51 AM   Modules accepted: Orders

## 2017-12-29 NOTE — Assessment & Plan Note (Signed)
Well-controlled. BP 122/82 today. - Continue Norvasc and Lisinopril - Check BMP - Follow-up in 1 year

## 2017-12-29 NOTE — Assessment & Plan Note (Signed)
Worsening recently. No signs of acute bacterial sinusitis. - Continue Levocetirizine daily - Restart Flonase - Follow-up in 1 year
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
## 2017-12-29 NOTE — Assessment & Plan Note (Signed)
Fairly well-controlled. PHQ-9 is an 8 today. Not interested in starting therapy. - Continue Effexor 75mg  bid and Wellbutrin 150mg  daily - Follow-up in 1 year

## 2017-12-29 NOTE — Assessment & Plan Note (Signed)
Well-controlled. Last lipid panel 12/29/2015 with chol 139, HDL 37, LDL 66, and TG 178. - Continue Zocor - Stop Niacin (received notice from pharmacy that benefits of co-administration of niacin and statin together no longer outweigh the risks) - Check lipid panel - Follow-up in 1 year

## 2017-12-29 NOTE — Patient Instructions (Signed)
It was so nice to see you today!  For your high cholesterol- we will stop your Niacin. Please keep taking the Zocor daily.  All your medications have been refilled.  I have checked some basic labs and I will call with those results.  -Dr. Nancy MarusMayo

## 2017-12-30 LAB — LIPID PANEL
CHOL/HDL RATIO: 3.4 ratio (ref 0.0–5.0)
Cholesterol, Total: 148 mg/dL (ref 100–199)
HDL: 44 mg/dL (ref >39)
LDL Calculated: 76 mg/dL (ref 0–99)
TRIGLYCERIDES: 139 mg/dL (ref 0–149)
VLDL Cholesterol Cal: 28 mg/dL (ref 5–40)

## 2017-12-30 LAB — COMPREHENSIVE METABOLIC PANEL
A/G RATIO: 1.7 (ref 1.2–2.2)
ALT: 26 IU/L (ref 0–44)
AST: 25 IU/L (ref 0–40)
Albumin: 4.5 g/dL (ref 3.5–5.5)
Alkaline Phosphatase: 103 IU/L (ref 39–117)
BILIRUBIN TOTAL: 0.5 mg/dL (ref 0.0–1.2)
BUN/Creatinine Ratio: 8 — ABNORMAL LOW (ref 9–20)
BUN: 9 mg/dL (ref 6–24)
CALCIUM: 9.7 mg/dL (ref 8.7–10.2)
CO2: 21 mmol/L (ref 20–29)
Chloride: 101 mmol/L (ref 96–106)
Creatinine, Ser: 1.06 mg/dL (ref 0.76–1.27)
GFR calc Af Amer: 95 mL/min/{1.73_m2} (ref >59)
GFR, EST NON AFRICAN AMERICAN: 83 mL/min/{1.73_m2} (ref >59)
GLOBULIN, TOTAL: 2.7 g/dL (ref 1.5–4.5)
Glucose: 185 mg/dL — ABNORMAL HIGH (ref 65–99)
POTASSIUM: 4 mmol/L (ref 3.5–5.2)
SODIUM: 140 mmol/L (ref 134–144)
Total Protein: 7.2 g/dL (ref 6.0–8.5)

## 2018-01-03 DIAGNOSIS — H2513 Age-related nuclear cataract, bilateral: Secondary | ICD-10-CM | POA: Diagnosis not present

## 2018-01-03 DIAGNOSIS — H40013 Open angle with borderline findings, low risk, bilateral: Secondary | ICD-10-CM | POA: Diagnosis not present

## 2018-01-03 DIAGNOSIS — H4900 Third [oculomotor] nerve palsy, unspecified eye: Secondary | ICD-10-CM | POA: Diagnosis not present

## 2018-01-03 DIAGNOSIS — H04123 Dry eye syndrome of bilateral lacrimal glands: Secondary | ICD-10-CM | POA: Diagnosis not present

## 2018-01-05 ENCOUNTER — Telehealth: Payer: Self-pay | Admitting: Internal Medicine

## 2018-01-05 NOTE — Telephone Encounter (Signed)
Patient left message he is returning call. Ples SpecterAlisa Brake, RN [facility] RN)

## 2018-01-05 NOTE — Telephone Encounter (Signed)
LM on VM to CB

## 2018-01-05 NOTE — Telephone Encounter (Signed)
Called patient regarding lab results from office visit. There was no answer so left VM asking him to return call.   CMET was unremarkable aside from elevated glucose to 185. However, A1c obtained that day was 5.6%.   Lipid panel showed good control of his LDL, "bad cholesterol", with a result of 76.   He should follow up with Dr. Nancy MarusMayo as scheduled.   Marcy Sirenatherine Wallace, D.O. 01/05/2018, 10:27 AM PGY-3, [facility]

## 2018-01-08 ENCOUNTER — Encounter: Payer: Self-pay | Admitting: *Deleted

## 2018-01-08 NOTE — Telephone Encounter (Signed)
Letter created and routed to Admin as we were unable to reach by Phone. Latese Dufault, Maryjo RochesterJessica Dawn, CMA

## 2018-01-16 ENCOUNTER — Other Ambulatory Visit: Payer: Self-pay | Admitting: Internal Medicine

## 2018-02-06 ENCOUNTER — Other Ambulatory Visit: Payer: Self-pay | Admitting: Internal Medicine

## 2018-02-21 ENCOUNTER — Other Ambulatory Visit: Payer: Self-pay | Admitting: Internal Medicine

## 2018-02-21 DIAGNOSIS — I10 Essential (primary) hypertension: Secondary | ICD-10-CM

## 2018-02-26 ENCOUNTER — Other Ambulatory Visit: Payer: Self-pay | Admitting: Internal Medicine

## 2018-03-08 ENCOUNTER — Other Ambulatory Visit: Payer: Self-pay | Admitting: Internal Medicine

## 2018-03-08 DIAGNOSIS — I10 Essential (primary) hypertension: Secondary | ICD-10-CM

## 2018-03-15 ENCOUNTER — Other Ambulatory Visit: Payer: Self-pay | Admitting: Internal Medicine

## 2018-03-25 ENCOUNTER — Other Ambulatory Visit: Payer: Self-pay | Admitting: Internal Medicine

## 2018-03-26 ENCOUNTER — Other Ambulatory Visit: Payer: Self-pay | Admitting: Internal Medicine

## 2018-03-26 DIAGNOSIS — M109 Gout, unspecified: Secondary | ICD-10-CM

## 2018-04-14 ENCOUNTER — Other Ambulatory Visit: Payer: Self-pay | Admitting: Internal Medicine

## 2018-04-23 ENCOUNTER — Other Ambulatory Visit: Payer: Self-pay

## 2018-04-24 MED ORDER — SIMVASTATIN 20 MG PO TABS: ORAL_TABLET | ORAL | 0 refills | Status: DC

## 2018-04-24 NOTE — Telephone Encounter (Signed)
VM left asking for patient to call and make an apt with new pcp. Please assist him in scheduling with H. Anderson.

## 2018-04-24 NOTE — Telephone Encounter (Signed)
I'm refilling one month of the nasal spray, but patient needs to schedule appt with clinic for follow-up of his HTN, depression, and HLD. Thanks!

## 2018-05-03 ENCOUNTER — Other Ambulatory Visit: Payer: Self-pay | Admitting: *Deleted

## 2018-05-03 DIAGNOSIS — F329 Major depressive disorder, single episode, unspecified: Secondary | ICD-10-CM

## 2018-05-03 DIAGNOSIS — F32A Depression, unspecified: Secondary | ICD-10-CM

## 2018-05-05 MED ORDER — VENLAFAXINE HCL 75 MG PO TABS: 75.0000 mg | ORAL_TABLET | Freq: Two times a day (BID) | ORAL | 0 refills | Status: DC

## 2018-05-07 ENCOUNTER — Other Ambulatory Visit: Payer: Self-pay | Admitting: *Deleted

## 2018-05-07 MED ORDER — ESOMEPRAZOLE MAGNESIUM 40 MG PO CPDR: 40.0000 mg | DELAYED_RELEASE_CAPSULE | Freq: Two times a day (BID) | ORAL | 0 refills | Status: DC

## 2018-05-08 ENCOUNTER — Other Ambulatory Visit: Payer: Self-pay | Admitting: Family Medicine

## 2018-06-19 ENCOUNTER — Other Ambulatory Visit: Payer: Self-pay

## 2018-06-19 DIAGNOSIS — M109 Gout, unspecified: Secondary | ICD-10-CM

## 2018-06-21 MED ORDER — ALLOPURINOL 100 MG PO TABS: ORAL_TABLET | ORAL | 3 refills | Status: DC

## 2018-07-17 ENCOUNTER — Other Ambulatory Visit: Payer: Self-pay

## 2018-07-17 ENCOUNTER — Ambulatory Visit (INDEPENDENT_AMBULATORY_CARE_PROVIDER_SITE_OTHER): Payer: Medicare Other | Admitting: Family Medicine

## 2018-07-17 ENCOUNTER — Encounter: Payer: Self-pay | Admitting: Family Medicine

## 2018-07-17 VITALS — BP 124/80 | HR 65 | Temp 98.4°F | Ht 67.0 in | Wt 260.6 lb

## 2018-07-17 DIAGNOSIS — F3341 Major depressive disorder, recurrent, in partial remission: Secondary | ICD-10-CM

## 2018-07-17 DIAGNOSIS — Z1159 Encounter for screening for other viral diseases: Secondary | ICD-10-CM | POA: Insufficient documentation

## 2018-07-17 DIAGNOSIS — Z23 Encounter for immunization: Secondary | ICD-10-CM | POA: Diagnosis not present

## 2018-07-17 DIAGNOSIS — R7309 Other abnormal glucose: Secondary | ICD-10-CM | POA: Diagnosis not present

## 2018-07-17 LAB — POCT GLYCOSYLATED HEMOGLOBIN (HGB A1C): Hemoglobin A1C: 5.4 % (ref 4.0–5.6)
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
## 2018-07-17 NOTE — Assessment & Plan Note (Signed)
Patient will be screened for hepatitis C per USPSTF recommendations.

## 2018-07-17 NOTE — Patient Instructions (Addendum)
Thank you for coming in to see [place] today! Please see below to review our plan for today's visit:  1. Your BMI is elevated at 40.8. A healthy BMI is about 25 or less. We recommend making an appointment with our nutritionist to seek guidance about making healthier food choices to benefit your overall health. 2. Please keep a food diary over EVERYTHING you eat or drink during the day! 3. You received a flu shot today. 4. For further issues involving depression, we highly recommend you come in to meet with a counselor. I encourage you to meet with them at least once. Please consider this! 5. To wrap up your physical, we are checking for Hepatitis C and your HbA1c, a marker for diabetes.   Please call the clinic at [phone] if your symptoms worsen or you have any concerns. It was our pleasure to serve you!      Dr. Peggyann Shoals [facility]

## 2018-07-17 NOTE — Assessment & Plan Note (Signed)
Patient received flu vaccination 07/17/2018.

## 2018-07-17 NOTE — Assessment & Plan Note (Signed)
Patient received routine testing for HbA1c.  Last value in March 2019 was 5.6%.

## 2018-07-17 NOTE — Assessment & Plan Note (Signed)
Patient's BMI is noted to be 40.8 on 07/17/2018.  The patient was counseled to start a food diary and to return to the clinic in 4 weeks for diarrhea review. -Plan to eventually refer to nutrition for further counseling.

## 2018-07-18 ENCOUNTER — Other Ambulatory Visit: Payer: Medicare Other

## 2018-07-18 DIAGNOSIS — Z1159 Encounter for screening for other viral diseases: Secondary | ICD-10-CM | POA: Diagnosis not present

## 2018-07-18 NOTE — Addendum Note (Signed)
Addended by: Jennette Bill on: 07/18/2018 12:06 PM   Modules accepted: Orders

## 2018-07-19 ENCOUNTER — Encounter: Payer: Self-pay | Admitting: Family Medicine

## 2018-07-19 LAB — HEPATITIS C ANTIBODY

## 2018-07-19 NOTE — Progress Notes (Signed)
Patient recently seen for check up and depression screen. He was also due for A1c and Hep C testing. Both results negative. Patient notified.  Peggyann Shoals, DO [facility], PGY-1 07/19/2018 9:12 AM

## 2018-07-31 ENCOUNTER — Other Ambulatory Visit: Payer: Self-pay | Admitting: Family Medicine

## 2018-07-31 DIAGNOSIS — F32A Depression, unspecified: Secondary | ICD-10-CM

## 2018-07-31 DIAGNOSIS — F329 Major depressive disorder, single episode, unspecified: Secondary | ICD-10-CM

## 2018-07-31 DIAGNOSIS — F3341 Major depressive disorder, recurrent, in partial remission: Secondary | ICD-10-CM

## 2018-08-03 DIAGNOSIS — M25511 Pain in right shoulder: Secondary | ICD-10-CM | POA: Diagnosis not present

## 2018-08-03 DIAGNOSIS — S40011A Contusion of right shoulder, initial encounter: Secondary | ICD-10-CM | POA: Diagnosis not present

## 2018-08-04 MED ORDER — BUPROPION HCL ER (XL) 150 MG PO TB24: 150.0000 mg | ORAL_TABLET | Freq: Every day | ORAL | 1 refills | Status: DC

## 2018-08-07 ENCOUNTER — Other Ambulatory Visit: Payer: Self-pay | Admitting: *Deleted

## 2018-08-07 MED ORDER — SIMVASTATIN 20 MG PO TABS: 20.0000 mg | ORAL_TABLET | Freq: Every day | ORAL | 3 refills | Status: DC

## 2018-08-10 ENCOUNTER — Ambulatory Visit (INDEPENDENT_AMBULATORY_CARE_PROVIDER_SITE_OTHER): Payer: Medicare Other | Admitting: Family Medicine

## 2018-08-10 ENCOUNTER — Other Ambulatory Visit: Payer: Self-pay

## 2018-08-10 ENCOUNTER — Encounter: Payer: Self-pay | Admitting: Family Medicine

## 2018-08-10 VITALS — BP 122/88 | HR 88 | Temp 98.5°F | Ht 67.0 in | Wt 261.0 lb

## 2018-08-10 DIAGNOSIS — F3341 Major depressive disorder, recurrent, in partial remission: Secondary | ICD-10-CM

## 2018-08-10 DIAGNOSIS — W108XXA Fall (on) (from) other stairs and steps, initial encounter: Secondary | ICD-10-CM | POA: Diagnosis not present

## 2018-08-10 MED ORDER — IBUPROFEN 600 MG PO TABS: 600.0000 mg | ORAL_TABLET | Freq: Three times a day (TID) | ORAL | 0 refills | Status: DC | PRN

## 2018-08-10 NOTE — Assessment & Plan Note (Signed)
-   Patient instructed to consider meeting with our counselors for additional support with depression.

## 2018-08-10 NOTE — Patient Instructions (Addendum)
Thank you for coming in to see [place] today! Please see below to review our plan for today's visit:  1. Take 1 Ibuprofen 600mg  tablet every 8 hours as needed for pain. Do not take Tylenol with the Ibuprofen as this can cause liver toxicity. 2. Use heating pads and warm baths to soothe muscle pains related to the fall. 3. Injuries from a fall can take a long time to heal. Try your best to be patient as your body heals. 4. Please consider meeting with our nutritionist for help with modifying your diet to improve your overall health. 5. Please consider meeting with our counselors for additional support with depression.  Please call the clinic at [phone] if your symptoms worsen or you have any concerns. It was our pleasure to serve you!    Dr. Peggyann Shoals [facility]
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
## 2018-08-10 NOTE — Assessment & Plan Note (Addendum)
-   Patient interested in meeting with nutrition.  Referral for nutrition placed.

## 2018-08-10 NOTE — Assessment & Plan Note (Signed)
-   Take 1 Ibuprofen 600mg  tablet every 8 hours as needed for pain. Do not take Tylenol with the Ibuprofen as this can cause liver toxicity. - Use heating pads and warm baths to soothe muscle pains related to the fall.

## 2018-08-16 ENCOUNTER — Other Ambulatory Visit: Payer: Self-pay

## 2018-08-17 MED ORDER — FLUTICASONE PROPIONATE 50 MCG/ACT NA SUSP: NASAL | 0 refills | Status: DC

## 2018-08-20 ENCOUNTER — Other Ambulatory Visit: Payer: Self-pay

## 2018-08-20 DIAGNOSIS — I10 Essential (primary) hypertension: Secondary | ICD-10-CM

## 2018-08-20 MED ORDER — LISINOPRIL 10 MG PO TABS: 10.0000 mg | ORAL_TABLET | Freq: Every day | ORAL | 0 refills | Status: DC

## 2018-09-02 ENCOUNTER — Emergency Department (HOSPITAL_COMMUNITY): Payer: Medicare Other | Admitting: Certified Registered Nurse Anesthetist

## 2018-09-02 ENCOUNTER — Encounter (HOSPITAL_COMMUNITY): Payer: Self-pay | Admitting: *Deleted

## 2018-09-02 ENCOUNTER — Encounter (HOSPITAL_COMMUNITY)
Admission: EM | Disposition: A | Discharge status: Home / Self Care | Payer: Self-pay | Source: Home / Self Care | Attending: Emergency Medicine

## 2018-09-02 ENCOUNTER — Other Ambulatory Visit: Payer: Self-pay

## 2018-09-02 ENCOUNTER — Emergency Department (HOSPITAL_COMMUNITY)
Admission: EM | Admit: 2018-09-02 | Discharge: 2018-09-02 | Disposition: A | Discharge status: Home / Self Care | Payer: Medicare Other | Attending: Gastroenterology | Admitting: Gastroenterology

## 2018-09-02 DIAGNOSIS — M109 Gout, unspecified: Secondary | ICD-10-CM | POA: Insufficient documentation

## 2018-09-02 DIAGNOSIS — E785 Hyperlipidemia, unspecified: Secondary | ICD-10-CM | POA: Diagnosis not present

## 2018-09-02 DIAGNOSIS — Z7982 Long term (current) use of aspirin: Secondary | ICD-10-CM | POA: Insufficient documentation

## 2018-09-02 DIAGNOSIS — I10 Essential (primary) hypertension: Secondary | ICD-10-CM | POA: Diagnosis not present

## 2018-09-02 DIAGNOSIS — Z7951 Long term (current) use of inhaled steroids: Secondary | ICD-10-CM | POA: Insufficient documentation

## 2018-09-02 DIAGNOSIS — Z79899 Other long term (current) drug therapy: Secondary | ICD-10-CM | POA: Insufficient documentation

## 2018-09-02 DIAGNOSIS — Z87820 Personal history of traumatic brain injury: Secondary | ICD-10-CM | POA: Insufficient documentation

## 2018-09-02 DIAGNOSIS — K210 Gastro-esophageal reflux disease with esophagitis: Secondary | ICD-10-CM | POA: Insufficient documentation

## 2018-09-02 DIAGNOSIS — T18128A Food in esophagus causing other injury, initial encounter: Secondary | ICD-10-CM

## 2018-09-02 DIAGNOSIS — T188XXA Foreign body in other parts of alimentary tract, initial encounter: Secondary | ICD-10-CM | POA: Insufficient documentation

## 2018-09-02 DIAGNOSIS — K222 Esophageal obstruction: Secondary | ICD-10-CM | POA: Insufficient documentation

## 2018-09-02 DIAGNOSIS — J302 Other seasonal allergic rhinitis: Secondary | ICD-10-CM | POA: Diagnosis not present

## 2018-09-02 DIAGNOSIS — Z96651 Presence of right artificial knee joint: Secondary | ICD-10-CM | POA: Diagnosis not present

## 2018-09-02 DIAGNOSIS — F419 Anxiety disorder, unspecified: Secondary | ICD-10-CM | POA: Diagnosis not present

## 2018-09-02 DIAGNOSIS — F329 Major depressive disorder, single episode, unspecified: Secondary | ICD-10-CM | POA: Diagnosis not present

## 2018-09-02 DIAGNOSIS — X58XXXA Exposure to other specified factors, initial encounter: Secondary | ICD-10-CM | POA: Insufficient documentation

## 2018-09-02 DIAGNOSIS — Z6838 Body mass index (BMI) 38.0-38.9, adult: Secondary | ICD-10-CM | POA: Insufficient documentation

## 2018-09-02 DIAGNOSIS — K219 Gastro-esophageal reflux disease without esophagitis: Secondary | ICD-10-CM | POA: Diagnosis not present

## 2018-09-02 HISTORY — PX: FOREIGN BODY REMOVAL: SHX962

## 2018-09-02 HISTORY — PX: ESOPHAGOGASTRODUODENOSCOPY (EGD) WITH PROPOFOL: SHX5813

## 2018-09-02 SURGERY — ESOPHAGOGASTRODUODENOSCOPY (EGD) WITH PROPOFOL
Anesthesia: Monitor Anesthesia Care

## 2018-09-02 MED ORDER — PROPOFOL 500 MG/50ML IV EMUL
INTRAVENOUS | Status: DC | PRN
  Administered 2018-09-02: 30 mg via INTRAVENOUS

## 2018-09-02 MED ORDER — ONDANSETRON HCL 4 MG/2ML IJ SOLN
4.0000 mg | Freq: Once | INTRAMUSCULAR | Status: AC
  Administered 2018-09-02: 4 mg via INTRAVENOUS
  Filled 2018-09-02: qty 2

## 2018-09-02 MED ORDER — NITROGLYCERIN 0.4 MG SL SUBL
0.4000 mg | SUBLINGUAL_TABLET | Freq: Once | SUBLINGUAL | Status: AC
  Administered 2018-09-02: 0.4 mg via SUBLINGUAL
  Filled 2018-09-02: qty 1

## 2018-09-02 MED ORDER — GLUCAGON HCL RDNA (DIAGNOSTIC) 1 MG IJ SOLR
1.0000 mg | Freq: Once | INTRAMUSCULAR | Status: AC
  Administered 2018-09-02: 1 mg via INTRAVENOUS
  Filled 2018-09-02: qty 1

## 2018-09-02 MED ORDER — LORAZEPAM 2 MG/ML IJ SOLN
0.5000 mg | Freq: Once | INTRAMUSCULAR | Status: AC
  Administered 2018-09-02: 0.5 mg via INTRAVENOUS
  Filled 2018-09-02: qty 1

## 2018-09-02 MED ORDER — SODIUM CHLORIDE 0.9 % IV BOLUS
1000.0000 mL | Freq: Once | INTRAVENOUS | Status: AC
  Administered 2018-09-02: 1000 mL via INTRAVENOUS

## 2018-09-02 MED ORDER — SODIUM CHLORIDE 0.9 % IV SOLN
INTRAVENOUS | Status: DC
  Administered 2018-09-02: 16:00:00 via INTRAVENOUS

## 2018-09-02 MED ORDER — PROPOFOL 500 MG/50ML IV EMUL
INTRAVENOUS | Status: DC | PRN
  Administered 2018-09-02: 150 ug/kg/min via INTRAVENOUS

## 2018-09-02 SURGICAL SUPPLY — 15 items
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
## 2018-09-02 NOTE — ED Provider Notes (Signed)
[facility]   CSN: [id number] Arrival date & time: 09/02/18  4098     History   Chief Complaint Chief Complaint  Patient presents with  . Food in Esphogus    HPI Kyle Kennedy is a 47 y.o. male who presents with c/o vomiting.  Patient is a poor historian. Hx is provided by the patient.  He has a history of very bad reflux and takes Nexium twice daily.  He is followed by Dr. Russella Dar of gastroenterology.  He has a history of previous esophageal dilatation for peptic stricture and states that his family has the same condition.  Last night he was eating pork chops.  He says he has false teeth and so symptoms he is unable to chew his food appropriately.  He swallowed the pork chop and then immediately felt like it was stuck and ran outside and vomited.  Since that time he has been unable to swallow any liquids.  He has been spitting up saliva and continuing to feel as if he has to vomit.  He says that he had something like this many years ago where they gave him "a shot in my IV" that made it improve. He denies abdominal pain.     HPI  Past Medical History:  Diagnosis Date  . Allergy   . Anxiety   . Blood transfusion   . Cough   . 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 Depression   . Double vision   . Fatigue   . GERD (gastroesophageal reflux disease)   . Gout   . Hiatal hernia   . Hyperlipidemia   . Hypertension   . Knee pain    right  . Loss of appetite   . Night sweats   . Osteoarthritis   . Peptic stricture of esophagus   . Shortness of breath    with exertion  . 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 Wears dentures     Patient Active Problem List   Diagnosis Date Noted  . Fall (on) (from) other stairs and steps, initial encounter 08/10/2018  . Need for immunization against influenza 07/17/2018  . Other abnormal glucose 07/17/2018  . Need for hepatitis C screening test 07/17/2018    . Severe obesity (BMI >= 40) (HCC) 07/17/2018  . Healthcare maintenance 02/08/2017  . 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 Hyperlipidemia   . Hypertension   . Depression   . GERD (gastroesophageal reflux disease)   . Seasonal allergies   . Traumatic brain injury (HCC)   . Gout   . Osteoarthritis   . Hiatal hernia     Past Surgical History:  Procedure Laterality Date  . APPENDECTOMY    . CHOLECYSTECTOMY    . ESOPHAGEAL DILATION    . EYE SURGERY    . HAMMER TOE SURGERY Left 12/24/2015   Procedure: LEFT FOURTH AND FIFTH HAMMER TOE CORRECTION AND FLEXOR TENDON RELEASES ;  Surgeon: Toni Arthurs, MD;  Location: [facility];  Service: Orthopedics;  Laterality: Left;  . HERNIA REPAIR    . JOINT REPLACEMENT    . 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 SPLENECTOMY, TOTAL  1982  . TOE SURGERY     left        Home Medications    Prior to Admission medications   Medication Sig Start Date End Date Taking? Authorizing Provider  allopurinol (ZYLOPRIM) 100 MG tablet TAKE 1 TABLET BY MOUTH DAILY 06/21/18   Peggyann ShoalsAnderson, Hannah C, DO  amLODipine (NORVASC) 10 MG tablet Take 1 tablet (10 mg total) by mouth daily. 12/29/17   Mayo, Allyn KennerKaty Dodd, MD  amLODipine (NORVASC) 10 MG tablet TAKE 1 TABLET BY MOUTH DAILY 03/08/18   Mayo, Allyn KennerKaty Dodd, MD  aspirin 81 MG tablet Take 1 tablet (81 mg total) by mouth daily. 08/18/14   Jamal CollinJoyner, James R, MD  buPROPion (WELLBUTRIN XL) 150 MG 24 hr tablet TAKE 1 TABLET BY MOUTH EVERY DAY 03/15/18   Mayo, Allyn KennerKaty Dodd, MD  buPROPion (WELLBUTRIN XL) 150 MG 24 hr tablet Take 1 tablet (150 mg total) by mouth daily. 08/04/18   Dollene ClevelandAnderson, Hannah C, DO  esomeprazole (NEXIUM) 40 MG capsule TAKE ONE CAPSULE BY MOUTH TWICE DAILY 02/06/18   Mayo, Allyn KennerKaty Dodd, MD  esomeprazole (NEXIUM) 40 MG capsule TAKE 1 CAPSULE BY MOUTH TWICE DAILY 05/11/18   Peggyann ShoalsAnderson, Hannah C, DO  fluticasone (FLONASE) 50 MCG/ACT nasal spray USE 2 SPRAYS IN EACH NOSTRIL EVERY DAY( SHAKE LIQUID WELL  BEFORE USE) 08/17/18   Dollene ClevelandAnderson, Hannah C, DO  gabapentin (NEURONTIN) 600 MG tablet Take 1 tablet (600 mg total) by mouth daily as needed. 12/29/17   Mayo, Allyn KennerKaty Dodd, MD  ibuprofen (ADVIL,MOTRIN) 600 MG tablet Take 1 tablet (600 mg total) by mouth every 8 (eight) hours as needed. 08/10/18   Dollene ClevelandAnderson, Hannah C, DO  levocetirizine (XYZAL) 5 MG tablet Take 1 tablet (5 mg total) by mouth every evening. 12/29/17   Mayo, Allyn KennerKaty Dodd, MD  lisinopril (PRINIVIL,ZESTRIL) 10 MG tablet Take 1 tablet (10 mg total) by mouth daily. 08/20/18   Dollene ClevelandAnderson, Hannah C, DO  Multiple Vitamin (MULTI-VITAMIN DAILY PO) Take by mouth.    [provider]  simvastatin (ZOCOR) 20 MG tablet TAKE 1 TABLET(20 MG) BY MOUTH AT BEDTIME 04/24/18   Peggyann ShoalsAnderson, Hannah C, DO  simvastatin (ZOCOR) 20 MG tablet Take 1 tablet (20 mg total) by mouth at bedtime. 08/07/18   Dollene ClevelandAnderson, Hannah C, DO  venlafaxine (EFFEXOR) 75 MG tablet TAKE 1 TABLET(75 MG) BY MOUTH TWICE DAILY 08/04/18   Dollene ClevelandAnderson, Hannah C, DO    Family History Family History  Problem Relation Age of Onset  . Hypertension Mother   . Hyperlipidemia Mother   . Hypertension Father   . Hyperlipidemia Father   . Heart disease Father        MI's  . Anxiety disorder Brother   . OCD Brother   . Hypertension Brother   . Hyperlipidemia Brother   . Diabetes Maternal Grandmother   . Stroke Maternal Grandmother     Social History Social History   Tobacco Use  . Smoking status: Never Smoker  . Smokeless tobacco: Never Used  Substance Use Topics  . Alcohol use: Yes    Comment: rare  . Drug use: Yes    Types: Marijuana    Comment: vapes legal cannabis     Allergies   Patient has no known allergies.   Review of Systems Review of Systems  Ten systems reviewed and are negative for acute change, except as noted in the HPI.   Physical Exam Updated Vital Signs BP 131/83   Pulse 71   Temp 98.1 F (36.7 C) (Oral)   Resp 19   Ht 5\' 9"  (1.753 m)   Wt 117.9 kg   SpO2  96%   BMI 38.40 kg/m   Physical Exam  Constitutional: He is oriented to person, place,  and time. He appears well-developed and well-nourished. No distress.  HENT:  Head: Normocephalic and atraumatic.  Eyes: Pupils are equal, round, and reactive to light. Conjunctivae and EOM are normal. No scleral icterus.  Neck: Normal range of motion. Neck supple.  Cardiovascular: Normal rate, regular rhythm and normal heart sounds.  Pulmonary/Chest: Effort normal and breath sounds normal. No respiratory distress.  Abdominal: Soft. Bowel sounds are normal. He exhibits no distension. There is no tenderness. There is no guarding.  Spitting into an emesis bag  Musculoskeletal: He exhibits no edema.  Neurological: He is alert and oriented to person, place, and time.  Skin: Skin is warm and dry. He is not diaphoretic.  Nursing note and vitals reviewed.    ED Treatments / Results  Labs (all labs ordered are listed, but only abnormal results are displayed) Labs Reviewed - No data to display  EKG None  Radiology No results found.  Procedures Procedures (including critical care time)  Medications Ordered in ED Medications  0.9 %  sodium chloride infusion ( Intravenous Continued from Pre-op 09/02/18 1611)  sodium chloride 0.9 % bolus 1,000 mL (0 mLs Intravenous Stopped 09/02/18 1250)  glucagon (human recombinant) (GLUCAGEN) injection 1 mg (1 mg Intravenous Given 09/02/18 1050)  LORazepam (ATIVAN) injection 0.5 mg (0.5 mg Intravenous Given 09/02/18 1042)  ondansetron (ZOFRAN) injection 4 mg (4 mg Intravenous Given 09/02/18 1043)  nitroGLYCERIN (NITROSTAT) SL tablet 0.4 mg (0.4 mg Sublingual Given 09/02/18 1255)     Initial Impression / Assessment and Plan / ED Course  I have reviewed the triage vital signs and the nursing notes.  Pertinent labs & imaging results that were available during my care of the patient were reviewed by me and considered in my medical decision making (see chart for  details).     This is a patient who likely has a food bolus impaction.  I attempted multiple times to relieve the bolus.  Patient was given IV glucagon, antiemetics and Ativan.  He was then p.o. challenged and had vomiting.  He was then given dissolved nitroglycerin and warm water which seemed to stay down however when he was again p.o. challenged with Sprite the patient had vomiting.  I also tried some applesauce.  Given the patient's repeated intolerance of even liquid foods I called and spoke with Dr. Chales Abrahams of [facility] gastro[facility]ology who is agreed he will come to do an upper endoscopy on the patient.  Patient was informed and is okay with this plan.  Final Clinical Impressions(s) / ED Diagnoses   Final diagnoses:  Esophageal obstruction due to food impaction    ED Discharge Orders    None       Arthor Captain, PA-C 09/02/18 1649    Shaune Pollack, MD 09/04/18 [phone]

## 2018-09-02 NOTE — Discharge Instructions (Addendum)
YOU HAD AN ENDOSCOPIC PROCEDURE TODAY: Refer to the procedure report and other information in the discharge instructions given to you for any specific questions about what was found during the examination. If this information does not answer your questions, please call [facility] office at [phone] to clarify.  ° °YOU SHOULD EXPECT: Some feelings of bloating in the abdomen. Passage of more gas than usual. Walking can help get rid of the air that was put into your GI tract during the procedure and reduce the bloating. If you had a lower endoscopy (such as a colonoscopy or flexible sigmoidoscopy) you may notice spotting of blood in your stool or on the toilet paper. Some abdominal soreness may be present for a day or two, also. ° °DIET: Your first meal following the procedure should be a light meal and then it is ok to progress to your normal diet. A half-sandwich or bowl of soup is an example of a good first meal. Heavy or fried foods are harder to digest and may make you feel nauseous or bloated. Drink plenty of fluids but you should avoid alcoholic beverages for 24 hours. If you had a esophageal dilation, please see attached instructions for diet.   ° °ACTIVITY: Your care partner should take you home directly after the procedure. You should plan to take it easy, moving slowly for the rest of the day. You can resume normal activity the day after the procedure however YOU SHOULD NOT DRIVE, use power tools, machinery or perform tasks that involve climbing or major physical exertion for 24 hours (because of the sedation medicines used during the test).  ° °SYMPTOMS TO REPORT IMMEDIATELY: °A gastroenterologist can be reached at any hour. Please call [phone]  for any of the following symptoms:  ° °Following upper endoscopy (EGD, EUS, ERCP, esophageal dilation) °Vomiting of blood or coffee ground material  °New, significant abdominal pain  °New, significant chest pain or pain under the shoulder blades  °Painful or  persistently difficult swallowing  °New shortness of breath  °Black, tarry-looking or red, bloody stools ° °FOLLOW UP:  °If any biopsies were taken you will be contacted by phone or by letter within the next 1-3 weeks. Call [phone]  if you have not heard about the biopsies in 3 weeks.  °Please also call with any specific questions about appointments or follow up tests. ° ° °Monitored Anesthesia Care, Care After °These instructions provide you with information about caring for yourself after your procedure. Your health care provider may also give you more specific instructions. Your treatment has been planned according to current medical practices, but problems sometimes occur. Call your health care provider if you have any problems or questions after your procedure. °What can I expect after the procedure? °After your procedure, it is common to: °· Feel sleepy for several hours. °· Feel clumsy and have poor balance for several hours. °· Feel forgetful about what happened after the procedure. °· Have poor judgment for several hours. °· Feel nauseous or vomit. °· Have a sore throat if you had a breathing tube during the procedure. ° °Follow these instructions at home: °For at least 24 hours after the procedure: ° °· Do not: °? Participate in activities in which you could fall or become injured. °? Drive. °? Use heavy machinery. °? Drink alcohol. °? Take sleeping pills or medicines that cause drowsiness. °? Make important decisions or sign legal documents. °? Take care of children on your own. °· Rest. °Eating and drinking °· Follow   the diet that is recommended by your health care provider. °· If you vomit, drink water, juice, or soup when you can drink without vomiting. °· Make sure you have little or no nausea before eating solid foods. °General instructions °· Have a responsible adult stay with you until you are awake and alert. °· Take over-the-counter and prescription medicines only as told by your health care  provider. °· If you smoke, do not smoke without supervision. °· Keep all follow-up visits as told by your health care provider. This is important. °Contact a health care provider if: °· You keep feeling nauseous or you keep vomiting. °· You feel light-headed. °· You develop a rash. °· You have a fever. °Get help right away if: °· You have trouble breathing. °This information is not intended to replace advice given to you by your health care provider. Make sure you discuss any questions you have with your health care provider. °Document Released: 01/24/2016 Document Revised: 05/25/2016 Document Reviewed: 01/24/2016 °Elsevier Interactive Patient Education © 2018 [employer] ° ° °

## 2018-09-02 NOTE — Anesthesia Preprocedure Evaluation (Signed)
Anesthesia Evaluation  Patient identified by MRN, date of birth, ID band Patient awake    Reviewed: Allergy & Precautions, NPO status , Patient's Chart, lab work & pertinent test results  Airway Mallampati: II  TM Distance: >3 FB Neck ROM: Full    Dental no notable dental hx. (+) Lower Dentures, Partial Upper   Pulmonary shortness of breath,    Pulmonary exam normal breath sounds clear to auscultation       Cardiovascular hypertension, Pt. on medications Normal cardiovascular exam Rhythm:Regular Rate:Normal     Neuro/Psych PSYCHIATRIC DISORDERS Anxiety Depression traumatic brain injury optic nerve injury negative neurological ROS  negative psych ROS   GI/Hepatic Neg liver ROS, GERD  Medicated,  Endo/Other  negative endocrine ROS  Renal/GU negative Renal ROS  negative genitourinary   Musculoskeletal  (+) Arthritis ,   Abdominal   Peds negative pediatric ROS (+)  Hematology negative hematology ROS (+)   Anesthesia Other Findings   Reproductive/Obstetrics negative OB ROS                             Anesthesia Physical  Anesthesia Plan  ASA: II and emergent  Anesthesia Plan: MAC   Post-op Pain Management:    Induction: Intravenous  PONV Risk Score and Plan: 1 and Ondansetron and Treatment may vary due to age or medical condition  Airway Management Planned: Nasal Cannula  Additional Equipment:   Intra-op Plan:   Post-operative Plan:   Informed Consent: I have reviewed the patients History and Physical, chart, labs and discussed the procedure including the risks, benefits and alternatives for the proposed anesthesia with the patient or authorized representative who has indicated his/her understanding and acceptance.   Dental advisory given  Plan Discussed with: CRNA  Anesthesia Plan Comments:         Anesthesia Quick Evaluation

## 2018-09-02 NOTE — Anesthesia Postprocedure Evaluation (Signed)
Anesthesia Post Note  Patient: Kyle Kennedy  Procedure(s) Performed: ESOPHAGOGASTRODUODENOSCOPY (EGD) WITH PROPOFOL (N/A ) FOREIGN BODY REMOVAL     Patient location during evaluation: PACU Anesthesia Type: MAC Level of consciousness: awake and alert Pain management: pain level controlled Vital Signs Assessment: post-procedure vital signs reviewed and stable Respiratory status: spontaneous breathing, nonlabored ventilation and respiratory function stable Cardiovascular status: stable and blood pressure returned to baseline Postop Assessment: no apparent nausea or vomiting Anesthetic complications: no    Last Vitals:  Vitals:   09/02/18 1700 09/02/18 1710  BP: (!) 117/59 126/87  Pulse: 71 65  Resp: 12 16  Temp:    SpO2: 97% 99%    Last Pain:  Vitals:   09/02/18 1652  TempSrc: Oral  PainSc: 0-No pain                 Lynda Rainwater

## 2018-09-02 NOTE — Transfer of Care (Signed)
Immediate Anesthesia Transfer of Care Note  Patient: Kyle Kennedy  Procedure(s) Performed: ESOPHAGOGASTRODUODENOSCOPY (EGD) WITH PROPOFOL (N/A ) FOREIGN BODY REMOVAL  Patient Location: PACU  Anesthesia Type:MAC  Level of Consciousness: awake, alert  and oriented  Airway & Oxygen Therapy: Patient Spontanous Breathing and Patient connected to face mask oxygen  Post-op Assessment: Report given to RN and Post -op Vital signs reviewed and stable  Post vital signs: Reviewed and stable  Last Vitals:  Vitals Value Taken Time  BP    Temp    Pulse 80 09/02/2018  4:51 PM  Resp 16 09/02/2018  4:51 PM  SpO2 100 % 09/02/2018  4:51 PM  Vitals shown include unvalidated device data.  Last Pain:  Vitals:   09/02/18 1605  TempSrc: Oral  PainSc: 0-No pain         Complications: No apparent anesthesia complications

## 2018-09-02 NOTE — ED Notes (Signed)
Bed: WA18 Expected date:  Expected time:  Means of arrival:  Comments: Pt in endo 

## 2018-09-02 NOTE — Op Note (Signed)
[facility] Patient Name: Kyle Kennedy Procedure Date: 09/02/2018 MRN: [id number] Attending MD: Lynann Bologna , MD Date of Birth: 1970/12/12 CSN: [id number] Age: 47 Admit Type: Outpatient Procedure:                Upper GI endoscopy Indications:              Foreign body in the esophagus Providers:                Lynann Bologna, MD, Roselie Awkward, RN, Kandice Robinsons, Technician Referring MD:              Medicines:                Monitored Anesthesia Care Complications:            No immediate complications. Estimated Blood Loss:     Estimated blood loss: none. Procedure:                Pre-Anesthesia Assessment:                           - Prior to the procedure, a History and Physical                            was performed, and patient medications and                            allergies were reviewed. The patient's tolerance of                            previous anesthesia was also reviewed. The risks                            and benefits of the procedure and the sedation                            options and risks were discussed with the patient.                            All questions were answered, and informed consent                            was obtained. Prior Anticoagulants: The patient has                            taken no previous anticoagulant or antiplatelet                            agents. ASA Grade Assessment: II - A patient with                            mild systemic disease. After reviewing the risks  and benefits, the patient was deemed in                            satisfactory condition to undergo the procedure.                           After obtaining informed consent, the endoscope was                            passed under direct vision. Throughout the                            procedure, the patient's blood pressure, pulse, and                            oxygen saturations  were monitored continuously. The                            GIF-H190 ([id number]) Olympus adult endoscope was                            introduced through the mouth, and advanced to the                            second part of duodenum. The upper GI endoscopy was                            performed with difficulty due to presence of food.                            The patient tolerated the procedure well. Scope In: Scope Out: Findings:      Large food (pork chop by history) was tightly impacted in the middle       third of the esophagus and in the lower third of the esophagus. Removal       of food was initially unsuccessful using a snare, basket and foreign       body graspers. It was finally accomplished using a snare and a large       piece was brought out per orally. Multiple pieces were brought out.       Esophagus was intubated about 6 times. Remaining food bolus then went       into the stomach. There was underlying distal esophageal stricture       measuring 1 cm with moderate esophagitis. There was some bleeding which       stopped on its own. Estimated blood loss was minimal.      The entire examined stomach was normal.      The duodenum was normal. Impression:               - Food in the middle third of the esophagus and in                            the lower third of the esophagus. Removal was  difficult but successful.                           - Underlying distal esophageal stricture with                            moderate esophagitis. Moderate Sedation:      none Recommendation:           - Patient has a contact number available for                            emergencies. The signs and symptoms of potential                            delayed complications were discussed with the                            patient. Return to normal activities tomorrow.                            Written discharge instructions were provided to the                             patient. Patient and patient's family do understand                            there is higher risk for delayed complications                            including aspiration, perforation, chest pains and                            bleeding. If he develops any fever, may need                            antibiotics. If he has any significant problems, I                            would have low threshold in getting a CT scan of                            the chest. For now he is doing great.                           - Clear liquid diet today. Chopped food initially                            meets tomorrow.                           - Increase Nexium to 40 mg p.o. twice daily x 4                            weeks, then continue Nexium  40 mg p.o. once a day                           - Repeat upper endoscopy in 4 weeks with esophageal                            dilatation. Can be scheduled with Dr. Russella DarStark. Procedure Code(s):        --- Professional ---                           [phone], Esophagogastroduodenoscopy, flexible,                            transoral; with removal of foreign body(s) Diagnosis Code(s):        --- Professional ---                           U04.540JT18.128A, Food in esophagus causing other injury,                            initial encounter                           T18.108A, Unspecified foreign body in esophagus                            causing other injury, initial encounter CPT copyright 2018 American Medical Association. All rights reserved. The codes documented in this report are preliminary and upon coder review may  be revised to meet current compliance requirements. Lynann Bolognaajesh Ashyr H[place]path, MD 09/02/2018 4:54:55 PM This report has been signed electronically. Number of Addenda: 0

## 2018-09-02 NOTE — ED Provider Notes (Signed)
Patient sent back to ED post EGD with removal of food impaction. Pt states feels fine now, asymptomatic. Pt denies chest discomfort or sob. Pt is fully awake and alert. No resp depression. Chest cta bilaterally. Vital signs normal. Pulse ox 98%.   Pt currently appears stable for d/c.      Cathren LaineSteinl, Gordie Belvin, MD 09/02/18 [phone]

## 2018-09-02 NOTE — ED Triage Notes (Signed)
Pt states he ate some pork chop last night and a piece did not go all the way down, He is able to swallow secretions but throws up anything else

## 2018-09-03 ENCOUNTER — Encounter: Payer: Self-pay | Admitting: Family Medicine

## 2018-09-03 ENCOUNTER — Encounter (HOSPITAL_COMMUNITY): Payer: Self-pay | Admitting: Gastroenterology

## 2018-09-06 ENCOUNTER — Other Ambulatory Visit: Payer: Self-pay | Admitting: Internal Medicine

## 2018-09-06 DIAGNOSIS — I10 Essential (primary) hypertension: Secondary | ICD-10-CM

## 2018-09-10 ENCOUNTER — Encounter: Payer: Self-pay | Admitting: Nurse Practitioner

## 2018-09-10 ENCOUNTER — Ambulatory Visit (INDEPENDENT_AMBULATORY_CARE_PROVIDER_SITE_OTHER): Payer: Medicare Other | Admitting: Nurse Practitioner

## 2018-09-10 ENCOUNTER — Telehealth: Payer: Self-pay

## 2018-09-10 VITALS — BP 118/64 | HR 65 | Ht 69.0 in | Wt 260.0 lb

## 2018-09-10 DIAGNOSIS — R1310 Dysphagia, unspecified: Secondary | ICD-10-CM

## 2018-09-10 DIAGNOSIS — K222 Esophageal obstruction: Secondary | ICD-10-CM

## 2018-09-10 NOTE — Patient Instructions (Signed)
If you are age 49 or older, your body mass index should be between 23-30. Your Body mass index is 38.4 kg/m. If this is out of the aforementioned range listed, please consider follow up with your Primary Care Provider.  If you are age 49 or younger, your body mass index should be between 19-25. Your Body mass index is 38.4 kg/m. If this is out of the aformentioned range listed, please consider follow up with your Primary Care Provider.   You have been scheduled for an endoscopy. Please follow written instructions given to you at your visit today. If you use inhalers (even only as needed), please bring them with you on the day of your procedure. Your physician has requested that you go to www.startemmi.com and enter the access code given to you at your visit today. This web site gives a general overview about your procedure. However, you should still follow specific instructions given to you by our office regarding your preparation for the procedure.  Continue Nexium 40 mg twice daily.  Thank you for choosing me and [facility].   Willette ClusterPaula Guenther, NP

## 2018-09-10 NOTE — Telephone Encounter (Signed)
Mother called back wanting to know if 09/19/18 is still open for procedure.  Her son would like to have procedure done on the 4th instead of 12/24.  Procedure moved to 09/19/18 at 11 am.  New instructions mailed to patient.

## 2018-09-10 NOTE — Progress Notes (Signed)
Reviewed and agree with management plan.  Alianny Toelle T. Aarionna Germer, MD FACG 

## 2018-09-10 NOTE — Progress Notes (Signed)
Chief Complaint:    Swallowing problems  IMPRESSION and PLAN:    47 year old male with long-standing GERD and history of esophageal strictures requiring dilation.  Recent ED visit for food impaction, status post EGD with extraction of large food bolus.  A 1 cm distal esophageal stricture /esophagitis seen at the time.  -Patient needs repeat EGD for dilation of distal esophageal stricture.The risks and benefits of EGD were discussed and the patient agrees to proceed.  He was offered an appointment 09/19/2018 but will be out of town so procedure will be done late December. -In the interim, advised him to eat small bites, chew well with liquids in between bites to avoid food impaction. -Continue Nexium 40 mg twice daily  HPI:     Patient is a 47 year old male known to Dr. Russella Dar.  He has a history of hypertension, obesity, and depression. . We have followed him for GERD/esophageal strictures requiring dilation Patient presented to the emergency department 09/02/2018 with a food impaction.  He underwent an emergent EGD by Dr. Chales Abrahams.  He had a 1 cm distal esophageal stricture/esophagitis a large food bolus was removed from the mid esophagus  Plan was for EGD with dilation 3 to 4 weeks later.  Patient was to continue Nexium 40 mg daily.  Patient comes in today and is still having problems with swallowing.  The main culprits are meat and sandwiches.  He is eating slowly, chewing well and taking small bites.  No other GI complaints  Review of systems:     No chest pain, no SOB, no fevers, no urinary sx   Past Medical History:  Diagnosis Date  . Allergy   . Anxiety   . Blood transfusion   . Cough   . 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 Depression   . Double vision   . GERD (gastroesophageal reflux disease)   . Gout   . Hiatal hernia   . Hyperlipidemia   . Hypertension    right  . Night sweats   . Osteoarthritis   . Peptic stricture of esophagus    with exertion  .  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 Wears dentures     Patient's surgical history, family medical history, social history, medications and allergies were all reviewed in Epic   Creatinine clearance cannot be calculated (Patient's most recent lab result is older than the maximum 21 days allowed.)  Current Outpatient Medications  Medication Sig Dispense Refill  . allopurinol (ZYLOPRIM) 100 MG tablet TAKE 1 TABLET BY MOUTH DAILY 90 tablet 3  . amLODipine (NORVASC) 10 MG tablet TAKE 1 TABLET BY MOUTH DAILY 90 tablet 0  . aspirin 81 MG tablet Take 1 tablet (81 mg total) by mouth daily. 30 tablet 11  . buPROPion (WELLBUTRIN XL) 150 MG 24 hr tablet Take 1 tablet (150 mg total) by mouth daily. 90 tablet 1  . esomeprazole (NEXIUM) 40 MG capsule TAKE 1 CAPSULE BY MOUTH TWICE DAILY 180 capsule 3  . fluticasone (FLONASE) 50 MCG/ACT nasal spray USE 2 SPRAYS IN EACH NOSTRIL EVERY DAY( SHAKE LIQUID WELL BEFORE USE) 48 g 0  . gabapentin (NEURONTIN) 600 MG tablet Take 1 tablet (600 mg total) by mouth daily as needed. 90 tablet 3  . ibuprofen (ADVIL,MOTRIN) 600 MG tablet Take 1 tablet (600 mg total) by mouth every 8 (eight) hours as needed. 30 tablet 0  . levocetirizine (XYZAL)  5 MG tablet Take 1 tablet (5 mg total) by mouth every evening. 90 tablet 3  . lisinopril (PRINIVIL,ZESTRIL) 10 MG tablet Take 1 tablet (10 mg total) by mouth daily. 90 tablet 0  . Multiple Vitamin (MULTI-VITAMIN DAILY PO) Take by mouth.    . simvastatin (ZOCOR) 20 MG tablet Take 1 tablet (20 mg total) by mouth at bedtime. 90 tablet 3  . venlafaxine (EFFEXOR) 75 MG tablet TAKE 1 TABLET(75 MG) BY MOUTH TWICE DAILY 180 tablet 3   Current Facility-Administered Medications  Medication Dose Route Frequency Provider Last Rate Last Dose  . 0.9 %  sodium chloride infusion  500 mL Intravenous Continuous Meryl DareStark, Malcolm T, MD        Physical Exam:     BP 118/64   Pulse 65   Ht 5\' 9"  (1.753 m)   Wt 260 lb (117.9 kg)    BMI 38.40 kg/m   GENERAL:  Pleasant male in NAD PSYCH: : Cooperative, normal affect EENT:  conjunctiva pink, mucous membranes moist, neck supple without masses CARDIAC:  RRR, murmur heard, no peripheral edema PULM: Normal respiratory effort, lungs CTA bilaterally, no wheezing ABDOMEN:  Nondistended, soft, nontender. Long midline old surgical scar, ? Hernia just left of the upper part of the incision.No obvious masses, no hepatomegaly,  normal bowel sounds SKIN:  turgor, no lesions seen Musculoskeletal:  Normal muscle tone, normal strength NEURO: Alert and oriented x 3, no focal neurologic deficits   Willette ClusterPaula Zarai Orsborn , NP 09/10/2018, 9:21 AM

## 2018-09-19 ENCOUNTER — Encounter: Payer: Self-pay | Admitting: Gastroenterology

## 2018-09-19 ENCOUNTER — Ambulatory Visit (AMBULATORY_SURGERY_CENTER): Payer: Medicare Other | Admitting: Gastroenterology

## 2018-09-19 VITALS — BP 129/79 | HR 62 | Temp 98.2°F | Resp 16 | Ht 69.0 in | Wt 260.0 lb

## 2018-09-19 DIAGNOSIS — K222 Esophageal obstruction: Secondary | ICD-10-CM

## 2018-09-19 DIAGNOSIS — R1310 Dysphagia, unspecified: Secondary | ICD-10-CM | POA: Diagnosis not present

## 2018-09-19 DIAGNOSIS — R1319 Other dysphagia: Secondary | ICD-10-CM

## 2018-09-19 MED ORDER — SODIUM CHLORIDE 0.9 % IV SOLN: 500.0000 mL | Freq: Once | INTRAVENOUS | Status: DC

## 2018-09-19 NOTE — Progress Notes (Signed)
Called to room to assist during endoscopic procedure.  Patient ID and intended procedure confirmed with present staff. Received instructions for my participation in the procedure from the performing physician.  

## 2018-09-19 NOTE — Progress Notes (Signed)
Report to PACU, RN, vss, BBS= Clear.  

## 2018-09-19 NOTE — Patient Instructions (Signed)
Handouts Provided:  Esophagitis and Stricture and Post Dilation Diet  YOU HAD AN ENDOSCOPIC PROCEDURE TODAY AT THE [facility]:   Refer to the procedure report that was given to you for any specific questions about what was found during the examination.  If the procedure report does not answer your questions, please call your gastroenterologist to clarify.  If you requested that your care partner not be given the details of your procedure findings, then the procedure report has been included in a sealed envelope for you to review at your convenience later.  YOU SHOULD EXPECT: Some feelings of bloating in the abdomen. Passage of more gas than usual.  Walking can help get rid of the air that was put into your GI tract during the procedure and reduce the bloating. If you had a lower endoscopy (such as a colonoscopy or flexible sigmoidoscopy) you may notice spotting of blood in your stool or on the toilet paper. If you underwent a bowel prep for your procedure, you may not have a normal bowel movement for a few days.  Please Note:  You might notice some irritation and congestion in your nose or some drainage.  This is from the oxygen used during your procedure.  There is no need for concern and it should clear up in a day or so.  SYMPTOMS TO REPORT IMMEDIATELY:   Following upper endoscopy (EGD)  Vomiting of blood or coffee ground material  New chest pain or pain under the shoulder blades  Painful or persistently difficult swallowing  New shortness of breath  Fever of 100F or higher  Black, tarry-looking stools  For urgent or emergent issues, a gastroenterologist can be reached at any hour by calling [phone].   DIET:  See Post Dilation diet provided to you.  Drink plenty of fluids but you should avoid alcoholic beverages for 24 hours.  ACTIVITY:  You should plan to take it easy for the rest of today and you should NOT DRIVE or use heavy machinery until tomorrow (because of the  sedation medicines used during the test).    FOLLOW UP: Our staff will call the number listed on your records the next business day following your procedure to check on you and address any questions or concerns that you may have regarding the information given to you following your procedure. If we do not reach you, we will leave a message.  However, if you are feeling well and you are not experiencing any problems, there is no need to return our call.  We will assume that you have returned to your regular daily activities without incident.  If any biopsies were taken you will be contacted by phone or by letter within the next 1-3 weeks.  Please call us at [phone] if you have not heard about the biopsies in 3 weeks.    SIGNATURES/CONFIDENTIALITY: You and/or your care partner have signed paperwork which will be entered into your electronic medical record.  These signatures attest to the fact that that the information above on your After Visit Summary has been reviewed and is understood.  Full responsibility of the confidentiality of this discharge information lies with you and/or your care-partner.

## 2018-09-20 ENCOUNTER — Telehealth: Payer: Self-pay

## 2018-09-20 NOTE — Telephone Encounter (Signed)
  Follow up Call-  Call back number 09/19/2018 08/31/2016  Post procedure Call Back phone  # [phone] house [phone]  Permission to leave phone message Yes Yes  Some recent data might be hidden     Patient questions:  Do you have a fever, pain , or abdominal swelling? No. Pain Score  0 *  Have you tolerated food without any problems? Yes.    Have you been able to return to your normal activities? Yes.    Do you have any questions about your discharge instructions: Diet   No. Medications  No. Follow up visit  No.  Do you have questions or concerns about your Care? No.  Actions: * If pain score is 4 or above: No action needed, pain <4.

## 2018-09-20 NOTE — Op Note (Signed)
[facility] Patient Name: Kyle Kennedy Procedure Date: 09/19/2018 11:03 AM MRN: [id number] Endoscopist: Meryl Dare , MD Age: 47 Referring MD:  Date of Birth: 1971/05/06 Gender: Male Account #: [id number] Procedure:                Upper GI endoscopy Indications:              Dysphagia, For therapy of esophageal stricture Medicines:                Monitored Anesthesia Care Procedure:                Pre-Anesthesia Assessment:                           - Prior to the procedure, a History and Physical                            was performed, and patient medications and                            allergies were reviewed. The patient's tolerance of                            previous anesthesia was also reviewed. The risks                            and benefits of the procedure and the sedation                            options and risks were discussed with the patient.                            All questions were answered, and informed consent                            was obtained. Prior Anticoagulants: The patient has                            taken no previous anticoagulant or antiplatelet                            agents. ASA Grade Assessment: II - A patient with                            mild systemic disease. After reviewing the risks                            and benefits, the patient was deemed in                            satisfactory condition to undergo the procedure.                           After obtaining informed consent, the endoscope was  passed under direct vision. Throughout the                            procedure, the patient's blood pressure, pulse, and                            oxygen saturations were monitored continuously. The                            Model GIF-[id number]) scope was introduced                            through the mouth, and advanced to the second part                            of  duodenum. The upper GI endoscopy was                            accomplished without difficulty. The patient                            tolerated the procedure well. Scope In: Scope Out: Findings:                 One benign-appearing, intrinsic moderate stenosis                            was found at the gastroesophageal junction. This                            stenosis measured 1.2 cm (inner diameter). The                            stenosis was traversed. A guidewire was placed and                            the scope was withdrawn. Dilations were performed                            with Savary dilators with mild resistance at 13 mm,                            14 mm and 15 mm. No heme noted.                           The exam of the esophagus was otherwise normal.                           A small hiatal hernia was present.                           The exam of the stomach was otherwise normal.  The duodenal bulb and second portion of the                            duodenum were normal. Complications:            No immediate complications. Estimated Blood Loss:     Estimated blood loss: none. Impression:               - Benign-appearing esophageal stenosis. Dilated.                           - Small hiatal hernia.                           - Normal duodenal bulb and second portion of the                            duodenum.                           - No specimens collected. Recommendation:           - Patient has a contact number available for                            emergencies. The signs and symptoms of potential                            delayed complications were discussed with the                            patient. Return to normal activities tomorrow.                            Written discharge instructions were provided to the                            patient.                           - Clear liquid diet for 2 hours, then advance as                             tolerated to soft diet today.                           - Antireflux measures long term.                           - Continue present medications.                           - Return to GI office in 1 month. Meryl Dare, MD 09/19/2018 11:28:20 AM This report has been signed electronically.

## 2018-09-25 ENCOUNTER — Encounter: Payer: Self-pay | Admitting: Family Medicine

## 2018-09-30 ENCOUNTER — Other Ambulatory Visit: Payer: Self-pay | Admitting: Internal Medicine

## 2018-10-09 ENCOUNTER — Encounter: Payer: Medicare Other | Admitting: Gastroenterology

## 2018-10-26 ENCOUNTER — Encounter: Payer: Self-pay | Admitting: Gastroenterology

## 2018-10-26 ENCOUNTER — Ambulatory Visit (INDEPENDENT_AMBULATORY_CARE_PROVIDER_SITE_OTHER): Payer: Medicare Other | Admitting: Gastroenterology

## 2018-10-26 VITALS — BP 134/82 | HR 68 | Ht 67.0 in | Wt 263.1 lb

## 2018-10-26 DIAGNOSIS — R1310 Dysphagia, unspecified: Secondary | ICD-10-CM | POA: Diagnosis not present

## 2018-10-26 DIAGNOSIS — K219 Gastro-esophageal reflux disease without esophagitis: Secondary | ICD-10-CM

## 2018-10-26 DIAGNOSIS — K222 Esophageal obstruction: Secondary | ICD-10-CM | POA: Diagnosis not present

## 2018-10-26 DIAGNOSIS — R1319 Other dysphagia: Secondary | ICD-10-CM

## 2018-10-26 NOTE — Progress Notes (Signed)
  3   History of Present Illness: This is a 48 year old male with GERD and an esophageal stricture. He underwent EGD last month as below. His dysphagia has improved but has not resolved.  He relates a couple episodes of difficulty swallowing bread.  His reflux symptoms are under very good control on his current regimen.  He is also concerned that he may have sleep apnea and requests referral for further evaluation.  EGD 09/19/2018 - Benign-appearing esophageal stenosis. Dilated to 15 mm. - Small hiatal hernia. - Normal duodenal bulb and second portion of the duodenum. - No specimens collected.   Current Medications, Allergies, Past Medical History, Past Surgical History, Family History and Social History were reviewed in [facility] electronic medical record.  Physical Exam: General: Well developed, well nourished, no acute distress Head: Normocephalic and atraumatic Eyes:  sclerae anicteric, EOMI Ears: Normal auditory acuity Mouth: No deformity or lesions Lungs: Clear throughout to auscultation Heart: Regular rate and rhythm; no murmurs, rubs or bruits Abdomen: Soft, non tender and non distended. No masses, hepatosplenomegaly or hernias noted. Normal Bowel sounds Rectal: Not done Musculoskeletal: Symmetrical with no gross deformities  Pulses:  Normal pulses noted Extremities: No clubbing, cyanosis, edema or deformities noted Neurological: Alert oriented x 4, grossly nonfocal Psychological:  Alert and cooperative. Normal mood and affect   Assessment and Recommendations:  1. GERD and an esophageal stricture. Dysphagia improved but has not resolved. Continue Nexium 40 mg po bid and antireflux measures. Schedule EGD/dilation. The risks (including bleeding, perforation, infection, missed lesions, medication reactions and possible hospitalization or surgery if complications occur), benefits, and alternatives to endoscopy with possible biopsy and possible dilation were discussed with  the patient and they consent to proceed.   2. Referral to [facility] for evaluation of possible OSA.

## 2018-10-26 NOTE — Patient Instructions (Signed)
You have been scheduled for an endoscopy. Please follow written instructions given to you at your visit today. If you use inhalers (even only as needed), please bring them with you on the day of your procedure. Your physician has requested that you go to www.startemmi.com and enter the access code given to you at your visit today. This web site gives a general overview about your procedure. However, you should still follow specific instructions given to you by our office regarding your preparation for the procedure.   We will refer you to pulmonary for sleep apnea evaluation.

## 2018-10-28 ENCOUNTER — Encounter: Payer: Self-pay | Admitting: Family Medicine

## 2018-11-01 ENCOUNTER — Other Ambulatory Visit: Payer: Self-pay | Admitting: Family Medicine

## 2018-11-01 DIAGNOSIS — I10 Essential (primary) hypertension: Secondary | ICD-10-CM

## 2018-11-08 ENCOUNTER — Ambulatory Visit (AMBULATORY_SURGERY_CENTER): Payer: Medicare Other | Admitting: Gastroenterology

## 2018-11-08 ENCOUNTER — Encounter: Payer: Self-pay | Admitting: Gastroenterology

## 2018-11-08 VITALS — BP 138/89 | HR 87 | Temp 96.8°F | Resp 16 | Ht 67.0 in | Wt 263.0 lb

## 2018-11-08 DIAGNOSIS — R1310 Dysphagia, unspecified: Secondary | ICD-10-CM

## 2018-11-08 DIAGNOSIS — R1319 Other dysphagia: Secondary | ICD-10-CM

## 2018-11-08 DIAGNOSIS — K222 Esophageal obstruction: Secondary | ICD-10-CM

## 2018-11-08 MED ORDER — SODIUM CHLORIDE 0.9 % IV SOLN: 500.0000 mL | Freq: Once | INTRAVENOUS | Status: DC

## 2018-11-08 NOTE — Progress Notes (Addendum)
No problems noted in the recovery room. Maw  Pt's mother assisted pt with dressing. maw

## 2018-11-08 NOTE — Patient Instructions (Addendum)
YOU HAD AN ENDOSCOPIC PROCEDURE TODAY AT THE [facility]:   Refer to the procedure report that was given to you for any specific questions about what was found during the examination.  If the procedure report does not answer your questions, please call your gastroenterologist to clarify.  If you requested that your care partner not be given the details of your procedure findings, then the procedure report has been included in a sealed envelope for you to review at your convenience later.  YOU SHOULD EXPECT: Some feelings of bloating in the abdomen. Passage of more gas than usual.  Walking can help get rid of the air that was put into your GI tract during the procedure and reduce the bloating. If you had a lower endoscopy (such as a colonoscopy or flexible sigmoidoscopy) you may notice spotting of blood in your stool or on the toilet paper. If you underwent a bowel prep for your procedure, you may not have a normal bowel movement for a few days.  Please Note:  You might notice some irritation and congestion in your nose or some drainage.  This is from the oxygen used during your procedure.  There is no need for concern and it should clear up in a day or so.  SYMPTOMS TO REPORT IMMEDIATELY:     Following upper endoscopy (EGD)  Vomiting of blood or coffee ground material  New chest pain or pain under the shoulder blades  Painful or persistently difficult swallowing  New shortness of breath  Fever of 100F or higher  Black, tarry-looking stools  For urgent or emergent issues, a gastroenterologist can be reached at any hour by calling [phone].   DIET:  Please follow the dilatation diet the rest of today.  A handout was given to your care partner.  Drink plenty of fluids but you should avoid alcoholic beverages for 24 hours.  ACTIVITY:  You should plan to take it easy for the rest of today and you should NOT DRIVE or use heavy machinery until tomorrow (because of the sedation  medicines used during the test).    FOLLOW UP: Our staff will call the number listed on your records the next business day following your procedure to check on you and address any questions or concerns that you may have regarding the information given to you following your procedure. If we do not reach you, we will leave a message.  However, if you are feeling well and you are not experiencing any problems, there is no need to return our call.  We will assume that you have returned to your regular daily activities without incident.  If any biopsies were taken you will be contacted by phone or by letter within the next 1-3 weeks.  Please call us at [phone] if you have not heard about the biopsies in 3 weeks.    SIGNATURES/CONFIDENTIALITY: You and/or your care partner have signed paperwork which will be entered into your electronic medical record.  These signatures attest to the fact that that the information above on your After Visit Summary has been reviewed and is understood.  Full responsibility of the confidentiality of this discharge information lies with you and/or your care-partner.    Handouts were given to your care partner on GERD, hiatal hernia and the esophageal dilatation diet to follow the rest of today. You may resume your current medications today. Return to GI Office in 1 year. Please call if any questions or concerns.

## 2018-11-08 NOTE — Progress Notes (Signed)
Report given to PACU, vss 

## 2018-11-08 NOTE — Progress Notes (Signed)
Called to room to assist during endoscopic procedure.  Patient ID and intended procedure confirmed with present staff. Received instructions for my participation in the procedure from the performing physician.  

## 2018-11-08 NOTE — Op Note (Signed)
[facility] Patient Name: Kyle Kennedy Procedure Date: 11/08/2018 12:07 PM MRN: [id number] Endoscopist: Meryl Dare , MD Age: 48 Referring MD:  Date of Birth: 1971-03-14 Gender: Male Account #: [id number] Procedure:                Upper GI endoscopy Indications:              Therapeutic procedure, for esohageal stricture                            dilation, Dysphagia, Medicines:                Monitored Anesthesia Care Procedure:                Pre-Anesthesia Assessment:                           - Prior to the procedure, a History and Physical                            was performed, and patient medications and                            allergies were reviewed. The patient's tolerance of                            previous anesthesia was also reviewed. The risks                            and benefits of the procedure and the sedation                            options and risks were discussed with the patient.                            All questions were answered, and informed consent                            was obtained. Prior Anticoagulants: The patient has                            taken no previous anticoagulant or antiplatelet                            agents. ASA Grade Assessment: II - A patient with                            mild systemic disease. After reviewing the risks                            and benefits, the patient was deemed in                            satisfactory condition to undergo the procedure.  After obtaining informed consent, the endoscope was                            passed under direct vision. Throughout the                            procedure, the patient's blood pressure, pulse, and                            oxygen saturations were monitored continuously. The                            Endoscope was introduced through the mouth, and                            advanced to the second part of  duodenum. The upper                            GI endoscopy was accomplished without difficulty.                            The patient tolerated the procedure well. Scope In: Scope Out: Findings:                 One benign-appearing, intrinsic mild stenosis was                            found at the gastroesophageal junction. This                            stenosis measured 1.5 cm (inner diameter). The                            stenosis was traversed. A guidewire was placed and                            the scope was withdrawn. Dilation was performed                            with a Savary dilator with no resistance at 15 mm.                            Dilations were performed with Savary dilators with                            mild resistance at 16 mm and 17 mm.                           The exam of the esophagus was otherwise normal.                           A small hiatal hernia was present.  The exam of the stomach was otherwise normal.                           The duodenal bulb and second portion of the                            duodenum were normal. Complications:            No immediate complications. Estimated Blood Loss:     Estimated blood loss was minimal. Impression:               - Benign-appearing esophageal stenosis. Dilated.                           - Small hiatal hernia.                           - Normal duodenal bulb and second portion of the                            duodenum.                           - No specimens collected. Recommendation:           - Patient has a contact number available for                            emergencies. The signs and symptoms of potential                            delayed complications were discussed with the                            patient. Return to normal activities tomorrow.                            Written discharge instructions were provided to the                            patient.                            - Clear liquid diet for 2 hours, then advance as                            tolerated to soft diet today.                           - Resume prior diet tomorrow.                           - Antireflux measures long term.                           - Continue present medications.                           -  Return to GI office in 1 year. Meryl Dare, MD 11/08/2018 12:25:41 PM This report has been signed electronically.

## 2018-11-09 ENCOUNTER — Telehealth: Payer: Self-pay

## 2018-11-09 NOTE — Telephone Encounter (Signed)
  Follow up Call-  Call back number 11/08/2018 09/19/2018 08/31/2016  Post procedure Call Back phone  # [phone] house [phone]  Permission to leave phone message Yes Yes Yes  Some recent data might be hidden     Patient questions:  Do you have a fever, pain , or abdominal swelling? No. Pain Score  0 *  Have you tolerated food without any problems? Yes.    Have you been able to return to your normal activities? Yes.    Do you have any questions about your discharge instructions: Diet   No. Medications  No. Follow up visit  No.  Do you have questions or concerns about your Care? No.  Actions: * If pain score is 4 or above: No action needed, pain <4.

## 2018-11-12 ENCOUNTER — Other Ambulatory Visit: Payer: Self-pay | Admitting: Family Medicine

## 2018-11-17 ENCOUNTER — Encounter: Payer: Self-pay | Admitting: Family Medicine

## 2018-11-26 ENCOUNTER — Other Ambulatory Visit: Payer: Self-pay | Admitting: Family Medicine

## 2018-11-26 DIAGNOSIS — I10 Essential (primary) hypertension: Secondary | ICD-10-CM

## 2018-12-11 ENCOUNTER — Ambulatory Visit (INDEPENDENT_AMBULATORY_CARE_PROVIDER_SITE_OTHER): Payer: Medicare Other | Admitting: Pulmonary Disease

## 2018-12-11 ENCOUNTER — Encounter: Payer: Self-pay | Admitting: Pulmonary Disease

## 2018-12-11 DIAGNOSIS — Z72821 Inadequate sleep hygiene: Secondary | ICD-10-CM | POA: Diagnosis not present

## 2018-12-11 DIAGNOSIS — G4733 Obstructive sleep apnea (adult) (pediatric): Secondary | ICD-10-CM

## 2018-12-11 NOTE — Assessment & Plan Note (Signed)
Given excessive daytime somnolence, narrow pharyngeal exam, witnessed apneas & loud snoring, obstructive sleep apnea is very likely & an overnight polysomnogram will be scheduled as a split study. The pathophysiology of obstructive sleep apnea , it's cardiovascular consequences & modes of treatment including CPAP were discused with the patient in detail & they evidenced understanding.  Pretest probability is high 

## 2018-12-11 NOTE — Assessment & Plan Note (Signed)
He has several issues with his sleep hygiene. -No set bedtime. -Heavy caffeine intake -Sedentary lifestyle  We may have to address this sequentially after his sleep study for now asked him to try and decrease his intake of [facility] for a few days prior to study

## 2018-12-11 NOTE — Patient Instructions (Addendum)
Schedule split-night study Try to cut down on [facility] for 2 days prior to study
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
## 2019-01-12 ENCOUNTER — Other Ambulatory Visit: Payer: Self-pay | Admitting: Internal Medicine

## 2019-01-14 ENCOUNTER — Encounter (HOSPITAL_BASED_OUTPATIENT_CLINIC_OR_DEPARTMENT_OTHER): Payer: Medicare Other

## 2019-01-18 ENCOUNTER — Other Ambulatory Visit: Payer: Self-pay | Admitting: Internal Medicine

## 2019-01-18 DIAGNOSIS — I10 Essential (primary) hypertension: Secondary | ICD-10-CM

## 2019-02-25 ENCOUNTER — Encounter (HOSPITAL_BASED_OUTPATIENT_CLINIC_OR_DEPARTMENT_OTHER): Payer: Medicare Other

## 2019-04-01 ENCOUNTER — Other Ambulatory Visit (HOSPITAL_COMMUNITY)
Admission: RE | Admit: 2019-04-01 | Discharge: 2019-04-01 | Disposition: A | Discharge status: Home / Self Care | Payer: Medicare Other | Source: Ambulatory Visit | Attending: Pulmonary Disease | Admitting: Pulmonary Disease

## 2019-04-01 DIAGNOSIS — Z1159 Encounter for screening for other viral diseases: Secondary | ICD-10-CM | POA: Diagnosis not present

## 2019-04-02 LAB — NOVEL CORONAVIRUS, NAA (HOSP ORDER, SEND-OUT TO REF LAB; TAT 18-24 HRS): SARS-CoV-2, NAA: NOT DETECTED

## 2019-04-04 ENCOUNTER — Other Ambulatory Visit: Payer: Self-pay

## 2019-04-04 ENCOUNTER — Ambulatory Visit (HOSPITAL_BASED_OUTPATIENT_CLINIC_OR_DEPARTMENT_OTHER): Payer: Medicare Other | Attending: Pulmonary Disease | Admitting: Pulmonary Disease

## 2019-04-04 DIAGNOSIS — G4733 Obstructive sleep apnea (adult) (pediatric): Secondary | ICD-10-CM

## 2019-04-11 NOTE — Telephone Encounter (Signed)
Kyle Kennedy, please see pt's mychart message and advise on it for pt if the sleep study results are available yet. Thank you!

## 2019-04-11 NOTE — Telephone Encounter (Signed)
04/11/2019 2136  04/04/2019-sleep study-AHI 25  Please let the patient know that his sleep study shows moderate obstructive sleep apnea.  Start time of sleep was around 10 PM as well as the end time was 4:30 AM.  Please set patient up with an appointment with an APP or Dr. Elsworth Soho to further discuss the results as well as discuss treatment options for obstructive sleep apnea  Wyn Quaker, FNP

## 2019-04-17 ENCOUNTER — Telehealth: Payer: Self-pay | Admitting: Pulmonary Disease

## 2019-04-17 DIAGNOSIS — G4733 Obstructive sleep apnea (adult) (pediatric): Secondary | ICD-10-CM

## 2019-04-17 NOTE — Telephone Encounter (Signed)
Sleep study showed moderate OSA with AHI 25/hour. Suggest auto CPAP 5 to 15 cm with medium full face mask. Office visit with APP in 6 weeks

## 2019-04-17 NOTE — Procedures (Signed)
Patient Name: Kyle Kennedy, [place] Date: 04/04/2019 Gender: Male D.O.B: [birth year] Age (years): 28 Referring Provider: Kara Mead MD, ABSM Height (inches): 67 Interpreting Physician: Kara Mead MD, ABSM Weight (lbs): 264 RPSGT: Zadie Rhine BMI: 41 MRN: [id number] Neck Size: 17.00 <br> <br> CLINICAL INFORMATION Sleep Study Type: NPSG  Indication for sleep study: snoring, somnolence, fatigue  Epworth Sleepiness Score: 5    SLEEP STUDY TECHNIQUE As per the AASM Manual for the Scoring of Sleep and Associated Events v2.3 (April 2016) with a hypopnea requiring 4% desaturations.  The channels recorded and monitored were frontal, central and occipital EEG, electrooculogram (EOG), submentalis EMG (chin), nasal and oral airflow, thoracic and abdominal wall motion, anterior tibialis EMG, snore microphone, electrocardiogram, and pulse oximetry.  MEDICATIONS Medications self-administered by patient taken the night of the study : N/A  SLEEP ARCHITECTURE The study was initiated at 10:03:17 PM and ended at 4:32:30 AM.  Sleep onset time was 62.4 minutes and the sleep efficiency was 48.7%%. The total sleep time was 189.5 minutes.  Stage REM latency was N/A minutes.  The patient spent 26.1%% of the night in stage N1 sleep, 73.9%% in stage N2 sleep, 0.0%% in stage N3 and 0% in REM.  Alpha intrusion was absent.  Supine sleep was 64.38%.  RESPIRATORY PARAMETERS The overall apnea/hypopnea index (AHI) was 25.0 per hour. There were 14 total apneas, including 14 obstructive, 0 central and 0 mixed apneas. There were 65 hypopneas and 12 RERAs.  The AHI during Stage REM sleep was N/A per hour.  AHI while supine was 27.5 per hour.  The mean oxygen saturation was 90.9%. The minimum SpO2 during sleep was 83.0%.  moderate snoring was noted during this study.  CARDIAC DATA The 2 lead EKG demonstrated sinus rhythm. The mean heart rate was 66.0 beats per minute. Other EKG findings include:  PVCs. LEG MOVEMENT DATA The total PLMS were 0 with a resulting PLMS index of 0.0. Associated arousal with leg movement index was 0.0 .  IMPRESSIONS - Moderate obstructive sleep apnea occurred during this study (AHI = 25.0/h). - No significant central sleep apnea occurred during this study (CAI = 0.0/h). - Mild oxygen desaturation was noted during this study (Min O2 = 83.0%). - The patient snored with moderate snoring volume. - EKG findings include PVCs. - Clinically significant periodic limb movements did not occur during sleep. No significant associated arousals.   DIAGNOSIS - Obstructive Sleep Apnea (327.23 [G47.33 ICD-10])   RECOMMENDATIONS - Therapeutic CPAP titration to determine optimal pressure required to alleviate sleep disordered breathing. Alternatively, auto CPAP can betried with a medium fisher paykel full facemask - Avoid alcohol, sedatives and other CNS depressants that may worsen sleep apnea and disrupt normal sleep architecture. - Sleep hygiene should be reviewed to assess factors that may improve sleep quality. - Weight management and regular exercise should be initiated or continued if appropriate.  Kara Mead MD Board Certified in [place]

## 2019-04-17 NOTE — Telephone Encounter (Signed)
Called pt to give him the results of the HST but pt was not at home. I left message with pt's wife Seth Bake to have pt call office so we can discuss HST results with pt and then schedule f/u appt. Will await return call.

## 2019-04-18 NOTE — Telephone Encounter (Signed)
Spoke with patient. He is aware of RA's recommendations. He wishes to proceed with the CPAP order. Advised him that I go ahead and place this today. I will also place a reminder on his chart to call him in 6 wks to follow up on his cpap and get him scheduled for a F/U.   Nothing further needed at time of call.

## 2019-04-18 NOTE — Telephone Encounter (Signed)
Pt returning call. Would like a call back

## 2019-04-18 NOTE — Telephone Encounter (Signed)
Left message for patient to call back  

## 2019-04-18 NOTE — Telephone Encounter (Signed)
Pt is calling back [phone]

## 2019-04-25 ENCOUNTER — Other Ambulatory Visit: Payer: Self-pay | Admitting: Family Medicine

## 2019-04-25 DIAGNOSIS — M109 Gout, unspecified: Secondary | ICD-10-CM

## 2019-04-27 ENCOUNTER — Encounter: Payer: Self-pay | Admitting: Gastroenterology

## 2019-05-06 DIAGNOSIS — G4733 Obstructive sleep apnea (adult) (pediatric): Secondary | ICD-10-CM | POA: Diagnosis not present

## 2019-05-08 ENCOUNTER — Other Ambulatory Visit: Payer: Self-pay | Admitting: Internal Medicine

## 2019-05-08 DIAGNOSIS — F3341 Major depressive disorder, recurrent, in partial remission: Secondary | ICD-10-CM

## 2019-05-09 ENCOUNTER — Other Ambulatory Visit: Payer: Self-pay | Admitting: Family Medicine

## 2019-05-22 ENCOUNTER — Encounter: Payer: Self-pay | Admitting: Family Medicine

## 2019-06-22 ENCOUNTER — Other Ambulatory Visit: Payer: Self-pay | Admitting: Family Medicine

## 2019-06-25 MED ORDER — ESOMEPRAZOLE MAGNESIUM 40 MG PO CPDR: 40.0000 mg | DELAYED_RELEASE_CAPSULE | Freq: Two times a day (BID) | ORAL | 3 refills | Status: DC

## 2019-07-15 ENCOUNTER — Other Ambulatory Visit: Payer: Self-pay | Admitting: Family Medicine

## 2019-08-06 ENCOUNTER — Other Ambulatory Visit: Payer: Self-pay | Admitting: Family Medicine

## 2019-08-11 ENCOUNTER — Other Ambulatory Visit: Payer: Self-pay | Admitting: Family Medicine

## 2019-08-13 ENCOUNTER — Encounter: Payer: Self-pay | Admitting: Family Medicine

## 2019-08-13 ENCOUNTER — Other Ambulatory Visit: Payer: Self-pay | Admitting: *Deleted

## 2019-08-13 MED ORDER — SIMVASTATIN 20 MG PO TABS: ORAL_TABLET | ORAL | 3 refills | Status: DC

## 2019-08-13 NOTE — Telephone Encounter (Signed)
Pt is requesting a 90 day supply.  Jessica Fleeger, CMA  

## 2019-09-07 ENCOUNTER — Other Ambulatory Visit: Payer: Self-pay | Admitting: Internal Medicine

## 2019-09-07 DIAGNOSIS — F329 Major depressive disorder, single episode, unspecified: Secondary | ICD-10-CM

## 2019-09-07 DIAGNOSIS — F32A Depression, unspecified: Secondary | ICD-10-CM

## 2019-09-08 ENCOUNTER — Encounter: Payer: Self-pay | Admitting: Family Medicine

## 2019-11-04 ENCOUNTER — Other Ambulatory Visit: Payer: Self-pay | Admitting: Family Medicine

## 2019-11-23 ENCOUNTER — Encounter: Payer: Self-pay | Admitting: Family Medicine

## 2019-11-25 ENCOUNTER — Other Ambulatory Visit: Payer: Self-pay | Admitting: Family Medicine

## 2019-11-25 DIAGNOSIS — I10 Essential (primary) hypertension: Secondary | ICD-10-CM

## 2019-11-25 MED ORDER — LISINOPRIL 10 MG PO TABS: ORAL_TABLET | ORAL | 3 refills | Status: DC

## 2019-12-12 ENCOUNTER — Encounter: Payer: Self-pay | Admitting: Family Medicine
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
## 2019-12-13 ENCOUNTER — Encounter: Payer: Self-pay | Admitting: Family Medicine

## 2019-12-13 ENCOUNTER — Ambulatory Visit (INDEPENDENT_AMBULATORY_CARE_PROVIDER_SITE_OTHER): Payer: Medicare Other | Admitting: Family Medicine

## 2019-12-13 ENCOUNTER — Other Ambulatory Visit: Payer: Self-pay

## 2019-12-13 VITALS — BP 132/78 | HR 81 | Wt 255.0 lb

## 2019-12-13 DIAGNOSIS — F32A Depression, unspecified: Secondary | ICD-10-CM

## 2019-12-13 DIAGNOSIS — H6121 Impacted cerumen, right ear: Secondary | ICD-10-CM

## 2019-12-13 DIAGNOSIS — F3341 Major depressive disorder, recurrent, in partial remission: Secondary | ICD-10-CM | POA: Diagnosis not present

## 2019-12-13 DIAGNOSIS — H6120 Impacted cerumen, unspecified ear: Secondary | ICD-10-CM | POA: Insufficient documentation

## 2019-12-13 DIAGNOSIS — F329 Major depressive disorder, single episode, unspecified: Secondary | ICD-10-CM

## 2019-12-13 DIAGNOSIS — Z1211 Encounter for screening for malignant neoplasm of colon: Secondary | ICD-10-CM | POA: Diagnosis not present

## 2019-12-13 NOTE — Assessment & Plan Note (Signed)
Right TM visualized after lavage.  Hearing restored to normal following lavage.

## 2019-12-13 NOTE — Patient Instructions (Signed)
Your ears are clean and it sounds like he can hear better at this point.  With regard to your depression, I understand this has been going on for a long time but I think that we can improve some of your symptoms.  For now, let us increase your dose of Effexor to 3 pills a day (this is a total of 225 mg daily).  I will attach a list below of a number of different therapists you can contact if you are interested in therapy.  Please also call the suicide hotline if you are having thoughts of hurting yourself or others.  Psychiatry Resource List (Adults and Children) Most of these providers will take Medicaid. please consult your insurance for a complete and updated list of available providers. When calling to make an appointment have your insurance information available to confirm you are covered.  [facility]:   [place]: [address]     [phone]   [place]: [address] [place],        [phone] [place]: [address],    [phone] [place]: [address],                   [phone]  [facility]  (Psychiatry & counseling ; adults & children ; will take Medicaid) [address], [place], [place]        [phone]   [facility]  (Psychiatry only; Adults only, will take Medicaid)  [address], [place], [place] [address]       [phone]   [facility] (Psychiatry & counseling ; adults & children ; will take Medicaid [address]  [place] [address]   Go on-line to complete referral ( [email or web address]://[email or web address].[email or web address]/en/make-[email or web address]-referral [phone]    (Spanish therapist)  [facility]  Psychiatry & counseling; Adults and children;  Call Registration prior to scheduling an appointment [phone] [address]    [place], [place] [address]    [phone]  [facility] (Psychiatry & counseling;  adults & children; Medicare no Medicaid)  [address]   [place], [place]  [address]      [phone]    [facility] (up to age 85)  Psychiatry & counseling ,will take Medicaid, must do counseling to receive psychiatry services  [address] [place] [address]        [phone]  [facility] (Psychiatry & counseling; adults & children; will take Medicaid) Will need a referral from provider [address],  [place], [place]  [phone]  [facility]---  Walk-in Mon-Fri, 8:30-5:00 (will take Medicaid)  [address] [place], [place]  [phone]    [facility] --- Walk-In Mon-Friday 8am-3pm ( will take Medicaid, Psychiatry, Adults & children,  [address] [place], [place]   [phone]   [facility]--, Walk-in M-F 8am-12pm and 1pm -3pm   (Counseling, Psychiatry, will take Medicaid, adults & children)  [address], [place], [place]  [phone]

## 2019-12-13 NOTE — Assessment & Plan Note (Signed)
PHQ-9 score of 10.  Question 9 given a value of 2.  No intent of hurting himself or others.  No plan to hurt himself.  He reports no recent changes in the severity of his depression and believes that he has been stable for years at this point.  He is not interested in seeing a psychologist at this time.  He is amenable to increasing his current medication. -Effexor increased to 225 mg daily -List of psychologist provided to contact if desired -Suicide hotline provided -Follow-up in 1-2 months

## 2019-12-16 MED ORDER — VENLAFAXINE HCL 75 MG PO TABS: 225.0000 mg | ORAL_TABLET | Freq: Every day | ORAL | 3 refills | Status: DC

## 2019-12-16 NOTE — Telephone Encounter (Signed)
Yes, you should be taking 225 mg of Effexor daily (which is 3 pills if you have the 75 mg pills at home).  I have sent in a new prescription with this dose.  There is no need to pick up a new prescription until you finish what you have at home.  Best, Mirian Mo, MD

## 2019-12-17 ENCOUNTER — Encounter: Payer: Self-pay | Admitting: Family Medicine

## 2019-12-19 NOTE — Addendum Note (Signed)
Addended by: Dollene Cleveland on: 12/19/2019 12:14 PM   Modules accepted: Orders

## 2019-12-23 ENCOUNTER — Encounter: Payer: Self-pay | Admitting: Family Medicine

## 2019-12-23 LAB — COLOGUARD: Cologuard: NEGATIVE

## 2019-12-25 ENCOUNTER — Other Ambulatory Visit: Payer: Self-pay | Admitting: *Deleted

## 2019-12-25 MED ORDER — LEVOCETIRIZINE DIHYDROCHLORIDE 5 MG PO TABS: 5.0000 mg | ORAL_TABLET | Freq: Every evening | ORAL | 3 refills | Status: DC

## 2019-12-26 LAB — COLOGUARD

## 2020-01-04 ENCOUNTER — Encounter: Payer: Self-pay | Admitting: Family Medicine

## 2020-01-06 DIAGNOSIS — Z1211 Encounter for screening for malignant neoplasm of colon: Secondary | ICD-10-CM | POA: Diagnosis not present

## 2020-01-06 DIAGNOSIS — Z1212 Encounter for screening for malignant neoplasm of rectum: Secondary | ICD-10-CM | POA: Diagnosis not present

## 2020-01-10 ENCOUNTER — Encounter: Payer: Self-pay | Admitting: Family Medicine

## 2020-01-10 DIAGNOSIS — F329 Major depressive disorder, single episode, unspecified: Secondary | ICD-10-CM

## 2020-01-10 DIAGNOSIS — F32A Depression, unspecified: Secondary | ICD-10-CM

## 2020-01-13 LAB — COLOGUARD: COLOGUARD: NEGATIVE

## 2020-01-14 ENCOUNTER — Other Ambulatory Visit: Payer: Self-pay | Admitting: *Deleted

## 2020-01-14 DIAGNOSIS — I10 Essential (primary) hypertension: Secondary | ICD-10-CM

## 2020-01-14 DIAGNOSIS — W108XXA Fall (on) (from) other stairs and steps, initial encounter: Secondary | ICD-10-CM

## 2020-01-14 DIAGNOSIS — F3341 Major depressive disorder, recurrent, in partial remission: Secondary | ICD-10-CM

## 2020-01-16 DIAGNOSIS — G4733 Obstructive sleep apnea (adult) (pediatric): Secondary | ICD-10-CM | POA: Diagnosis not present

## 2020-01-16 MED ORDER — BUPROPION HCL ER (XL) 150 MG PO TB24: 150.0000 mg | ORAL_TABLET | Freq: Every day | ORAL | 1 refills | Status: DC

## 2020-01-16 MED ORDER — AMLODIPINE BESYLATE 10 MG PO TABS: 10.0000 mg | ORAL_TABLET | Freq: Every day | ORAL | 0 refills | Status: DC

## 2020-01-16 MED ORDER — VENLAFAXINE HCL 75 MG PO TABS: 225.0000 mg | ORAL_TABLET | Freq: Every day | ORAL | 3 refills | Status: DC

## 2020-01-16 MED ORDER — IBUPROFEN 600 MG PO TABS: 600.0000 mg | ORAL_TABLET | Freq: Three times a day (TID) | ORAL | 0 refills | Status: DC | PRN

## 2020-01-27 ENCOUNTER — Encounter: Payer: Self-pay | Admitting: Family Medicine

## 2020-03-02 DIAGNOSIS — G4733 Obstructive sleep apnea (adult) (pediatric): Secondary | ICD-10-CM | POA: Diagnosis not present

## 2020-03-05 DIAGNOSIS — G4733 Obstructive sleep apnea (adult) (pediatric): Secondary | ICD-10-CM | POA: Diagnosis not present

## 2020-03-15 ENCOUNTER — Other Ambulatory Visit: Payer: Self-pay | Admitting: Family Medicine

## 2020-03-15 DIAGNOSIS — W108XXA Fall (on) (from) other stairs and steps, initial encounter: Secondary | ICD-10-CM

## 2020-03-17 ENCOUNTER — Other Ambulatory Visit: Payer: Self-pay

## 2020-03-17 MED ORDER — ESOMEPRAZOLE MAGNESIUM 40 MG PO CPDR: 40.0000 mg | DELAYED_RELEASE_CAPSULE | Freq: Two times a day (BID) | ORAL | 3 refills | Status: DC

## 2020-04-04 ENCOUNTER — Other Ambulatory Visit: Payer: Self-pay | Admitting: Family Medicine

## 2020-04-04 DIAGNOSIS — I10 Essential (primary) hypertension: Secondary | ICD-10-CM

## 2020-04-05 DIAGNOSIS — G4733 Obstructive sleep apnea (adult) (pediatric): Secondary | ICD-10-CM | POA: Diagnosis not present

## 2020-05-05 DIAGNOSIS — G4733 Obstructive sleep apnea (adult) (pediatric): Secondary | ICD-10-CM | POA: Diagnosis not present

## 2020-05-25 ENCOUNTER — Other Ambulatory Visit: Payer: Self-pay | Admitting: Family Medicine

## 2020-06-01 DIAGNOSIS — G4733 Obstructive sleep apnea (adult) (pediatric): Secondary | ICD-10-CM | POA: Diagnosis not present

## 2020-06-05 DIAGNOSIS — G4733 Obstructive sleep apnea (adult) (pediatric): Secondary | ICD-10-CM | POA: Diagnosis not present

## 2020-06-06 ENCOUNTER — Encounter: Payer: Self-pay | Admitting: Family Medicine

## 2020-06-06 ENCOUNTER — Other Ambulatory Visit: Payer: Self-pay | Admitting: Family Medicine

## 2020-06-08 ENCOUNTER — Other Ambulatory Visit: Payer: Self-pay | Admitting: Family Medicine

## 2020-06-08 MED ORDER — GABAPENTIN 600 MG PO TABS: 300.0000 mg | ORAL_TABLET | Freq: Every day | ORAL | 3 refills | Status: DC | PRN

## 2020-07-05 ENCOUNTER — Other Ambulatory Visit: Payer: Self-pay | Admitting: Family Medicine

## 2020-07-05 DIAGNOSIS — F3341 Major depressive disorder, recurrent, in partial remission: Secondary | ICD-10-CM

## 2020-08-10 ENCOUNTER — Other Ambulatory Visit: Payer: Self-pay | Admitting: Family Medicine

## 2020-08-10 DIAGNOSIS — I10 Essential (primary) hypertension: Secondary | ICD-10-CM

## 2020-08-31 DIAGNOSIS — G4733 Obstructive sleep apnea (adult) (pediatric): Secondary | ICD-10-CM | POA: Diagnosis not present

## 2020-09-06 ENCOUNTER — Other Ambulatory Visit: Payer: Self-pay | Admitting: Family Medicine

## 2020-10-19 ENCOUNTER — Other Ambulatory Visit: Payer: Self-pay | Admitting: Family Medicine

## 2020-10-19 DIAGNOSIS — F32A Depression, unspecified: Secondary | ICD-10-CM

## 2020-10-30 ENCOUNTER — Other Ambulatory Visit: Payer: Self-pay | Admitting: Family Medicine

## 2020-10-30 ENCOUNTER — Encounter: Payer: Self-pay | Admitting: Family Medicine

## 2020-10-30 DIAGNOSIS — F32A Depression, unspecified: Secondary | ICD-10-CM

## 2020-10-30 MED ORDER — VENLAFAXINE HCL 75 MG PO TABS: 225.0000 mg | ORAL_TABLET | Freq: Every day | ORAL | 3 refills | Status: DC

## 2020-11-07 ENCOUNTER — Other Ambulatory Visit: Payer: Self-pay | Admitting: Family Medicine

## 2020-11-07 DIAGNOSIS — F32A Depression, unspecified: Secondary | ICD-10-CM

## 2020-12-01 DIAGNOSIS — G4733 Obstructive sleep apnea (adult) (pediatric): Secondary | ICD-10-CM | POA: Diagnosis not present

## 2020-12-08 ENCOUNTER — Encounter: Payer: Self-pay | Admitting: Family Medicine

## 2021-01-01 ENCOUNTER — Other Ambulatory Visit: Payer: Self-pay | Admitting: Family Medicine

## 2021-01-01 DIAGNOSIS — F3341 Major depressive disorder, recurrent, in partial remission: Secondary | ICD-10-CM

## 2021-01-01 DIAGNOSIS — I10 Essential (primary) hypertension: Secondary | ICD-10-CM

## 2021-02-09 ENCOUNTER — Encounter: Payer: Self-pay | Admitting: Family Medicine

## 2021-02-19 ENCOUNTER — Other Ambulatory Visit: Payer: Self-pay | Admitting: Family Medicine

## 2021-02-19 DIAGNOSIS — F32A Depression, unspecified: Secondary | ICD-10-CM

## 2021-02-19 MED ORDER — GABAPENTIN 600 MG PO TABS: 300.0000 mg | ORAL_TABLET | Freq: Every day | ORAL | 1 refills | Status: DC | PRN

## 2021-02-19 MED ORDER — VENLAFAXINE HCL 75 MG PO TABS
ORAL_TABLET | ORAL | 6 refills | Status: DC
Start: 2021-02-19 — End: 2022-04-18

## 2021-02-24 ENCOUNTER — Other Ambulatory Visit: Payer: Self-pay | Admitting: Family Medicine

## 2021-02-24 MED ORDER — GABAPENTIN 600 MG PO TABS: 900.0000 mg | ORAL_TABLET | Freq: Every day | ORAL | 3 refills | Status: DC | PRN

## 2021-02-25 ENCOUNTER — Other Ambulatory Visit: Payer: Self-pay | Admitting: Family Medicine

## 2021-02-25 MED ORDER — FLUTICASONE PROPIONATE 50 MCG/ACT NA SUSP: 1.0000 | Freq: Every day | NASAL | 2 refills | Status: DC

## 2021-03-01 DIAGNOSIS — G4733 Obstructive sleep apnea (adult) (pediatric): Secondary | ICD-10-CM | POA: Diagnosis not present

## 2021-03-05 ENCOUNTER — Other Ambulatory Visit: Payer: Self-pay | Admitting: Family Medicine

## 2021-03-05 DIAGNOSIS — I10 Essential (primary) hypertension: Secondary | ICD-10-CM

## 2021-05-08 ENCOUNTER — Other Ambulatory Visit: Payer: Self-pay | Admitting: Family Medicine

## 2021-05-31 ENCOUNTER — Other Ambulatory Visit: Payer: Self-pay | Admitting: Family Medicine

## 2021-06-11 DIAGNOSIS — G4733 Obstructive sleep apnea (adult) (pediatric): Secondary | ICD-10-CM | POA: Diagnosis not present

## 2021-06-23 ENCOUNTER — Encounter: Payer: Self-pay | Admitting: Student

## 2021-06-23 DIAGNOSIS — L237 Allergic contact dermatitis due to plants, except food: Secondary | ICD-10-CM

## 2021-06-24 MED ORDER — TRIAMCINOLONE ACETONIDE 0.025 % EX OINT: 1.0000 "application " | TOPICAL_OINTMENT | Freq: Two times a day (BID) | CUTANEOUS | 0 refills | Status: DC

## 2021-06-24 MED ORDER — CALAMINE EX LOTN
1.0000 | TOPICAL_LOTION | CUTANEOUS | 0 refills | Status: DC | PRN
Start: 2021-06-24 — End: 2021-08-17

## 2021-06-25 ENCOUNTER — Other Ambulatory Visit: Payer: Self-pay | Admitting: Family Medicine

## 2021-06-25 DIAGNOSIS — J302 Other seasonal allergic rhinitis: Secondary | ICD-10-CM

## 2021-07-04 ENCOUNTER — Other Ambulatory Visit: Payer: Self-pay | Admitting: Family Medicine

## 2021-07-04 DIAGNOSIS — F3341 Major depressive disorder, recurrent, in partial remission: Secondary | ICD-10-CM

## 2021-07-07 ENCOUNTER — Other Ambulatory Visit: Payer: Self-pay | Admitting: Family Medicine

## 2021-07-07 DIAGNOSIS — I10 Essential (primary) hypertension: Secondary | ICD-10-CM

## 2021-07-07 NOTE — Telephone Encounter (Signed)
Will add more refills to the medication after I see the patient on 9/23
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
## 2021-07-09 ENCOUNTER — Ambulatory Visit (INDEPENDENT_AMBULATORY_CARE_PROVIDER_SITE_OTHER): Payer: Medicare Other | Admitting: Student

## 2021-07-09 ENCOUNTER — Other Ambulatory Visit: Payer: Self-pay | Admitting: Student

## 2021-07-09 ENCOUNTER — Encounter: Payer: Self-pay | Admitting: Student

## 2021-07-09 ENCOUNTER — Other Ambulatory Visit: Payer: Self-pay

## 2021-07-09 VITALS — BP 135/97 | HR 76 | Wt 264.2 lb

## 2021-07-09 DIAGNOSIS — K439 Ventral hernia without obstruction or gangrene: Secondary | ICD-10-CM | POA: Insufficient documentation

## 2021-07-09 DIAGNOSIS — Z23 Encounter for immunization: Secondary | ICD-10-CM | POA: Diagnosis not present

## 2021-07-09 DIAGNOSIS — F3341 Major depressive disorder, recurrent, in partial remission: Secondary | ICD-10-CM

## 2021-07-09 DIAGNOSIS — S069X9D Unspecified intracranial injury with loss of consciousness of unspecified duration, subsequent encounter: Secondary | ICD-10-CM

## 2021-07-09 DIAGNOSIS — I10 Essential (primary) hypertension: Secondary | ICD-10-CM

## 2021-07-09 DIAGNOSIS — Z0000 Encounter for general adult medical examination without abnormal findings: Secondary | ICD-10-CM | POA: Diagnosis not present

## 2021-07-09 NOTE — Assessment & Plan Note (Addendum)
Patient says he is prescribed 600 mg of gabapentin but was instructed to take 1-1/2 tablets which is equivalent to 900 mg.  900 mg of gabapentin is helpful for him and he does well on that dose.  We will continue with his current dosage.

## 2021-07-09 NOTE — Assessment & Plan Note (Signed)
Patient has been tested for HIV and hep C.  Continue with a lipid panel in addition to a BMP today.  Patient also received his flu vaccine.

## 2021-07-09 NOTE — Assessment & Plan Note (Signed)
Currently the patient is on Effexor 225 mg and Wellbutrin 150 mg daily.  He has no active suicidality or homicidality.  His PHQ-9 today is 8 which is down from 10.  Question on is given a value of 1.  However, this was discussed with the patient and he reports that in the past he has had feelings of "not wanting to be here."  However, he states he would never do it, and the patient is doing well and is stable today.  Continue with current regimen.  Has not been interested in seeing a psychologist, but he has a list from previous encounter.  Follow-up with me in 6 months or earlier if needed.  Strict return precautions were given to seek immediate health care if patient ever feels down, depressed, hopeless, homicidal or suicidal.  Suicide hotline was provided once again.

## 2021-07-09 NOTE — Patient Instructions (Addendum)
It was great to see you today! We talked about your depression and how well your medications have been helping you. We have also discussed coping mechanisms and I will provide the suicide hotline. You have strict return precautions for feeling down, depressed, hopeless, suicidal. I will also get some updated labs. I will refer you to general surgery for your hernia to see if they would like to do anything for it. If you need anything, please let me know.   Best,  [name]

## 2021-07-09 NOTE — Assessment & Plan Note (Addendum)
Blood pressure today 135/97. Pt reports that he gets elevated pressure when he is at the doctors office. Continue with Norvasc and lisinopril.  We will follow-up with patient about BMP obtained today. Will have the patient check his pressure at home with his cuff and I will call to follow up with me in a couple of days. Will give strict ED precautions for the patient and when I call with labs will have the patient follow up with me in 1-2 weeks

## 2021-07-09 NOTE — Assessment & Plan Note (Addendum)
Patient reports he has this abdominal wall hernia that he would like to see someone about possible hernia repair.  I have referred him to general surgery to take a look at it.  No signs of acute abdomen or any cause for concern on exam.

## 2021-07-10 ENCOUNTER — Telehealth: Payer: Self-pay | Admitting: Student

## 2021-07-10 LAB — BASIC METABOLIC PANEL
BUN/Creatinine Ratio: 9 (ref 9–20)
BUN: 9 mg/dL (ref 6–24)
CO2: 25 mmol/L (ref 20–29)
Calcium: 10.5 mg/dL — ABNORMAL HIGH (ref 8.7–10.2)
Chloride: 103 mmol/L (ref 96–106)
Creatinine, Ser: 1.03 mg/dL (ref 0.76–1.27)
Glucose: 80 mg/dL (ref 65–99)
Potassium: 4.9 mmol/L (ref 3.5–5.2)
Sodium: 146 mmol/L — ABNORMAL HIGH (ref 134–144)
eGFR: 87 mL/min/{1.73_m2} (ref >59)

## 2021-07-10 LAB — LIPID PANEL
Chol/HDL Ratio: 4.1 ratio (ref 0.0–5.0)
Cholesterol, Total: 152 mg/dL (ref 100–199)
HDL: 37 mg/dL — ABNORMAL LOW
LDL Chol Calc (NIH): 80 mg/dL (ref 0–99)
Triglycerides: 208 mg/dL — ABNORMAL HIGH (ref 0–149)
VLDL Cholesterol Cal: 35 mg/dL (ref 5–40)

## 2021-07-10 NOTE — Telephone Encounter (Signed)
Spoke to the patient today about his blood pressures at home. He says that they were lower than when he came into the clinic, but he was travelling and did not have the exact number.  I spoke to the patient about strict ED return precautions for elevated blood pressure and if he becomes symptomatic.  I will have the patient record his pressures over the next couple of weeks with feet flat on the ground, arm fully extended, in a quiet room.  We will reassess his blood pressure at next visit on October 12, which is scheduled already.  Also, I would like to assess his mood.  Spoke to him about elevated sodium and calcium and advised the patient to hydrate as well as avoid salt.  He may need an increase in his statin dosage as well.  Patient voiced understanding and is aware about his upcoming appt.

## 2021-07-12 ENCOUNTER — Other Ambulatory Visit: Payer: Self-pay | Admitting: Student

## 2021-07-12 MED ORDER — SIMVASTATIN 40 MG PO TABS: ORAL_TABLET | ORAL | 0 refills | Status: DC

## 2021-07-12 NOTE — Progress Notes (Signed)
Spoke to patient about increasing simvastatin to 40 mg due to current lipid panel. At next BP check, will have patient schedule an appointment for updated lipid panel.

## 2021-07-23 ENCOUNTER — Ambulatory Visit: Payer: Medicare Other | Admitting: Surgery

## 2021-07-28 ENCOUNTER — Ambulatory Visit (INDEPENDENT_AMBULATORY_CARE_PROVIDER_SITE_OTHER): Payer: Medicare Other | Admitting: Surgery

## 2021-07-28 ENCOUNTER — Encounter: Payer: Self-pay | Admitting: Surgery

## 2021-07-28 ENCOUNTER — Ambulatory Visit (INDEPENDENT_AMBULATORY_CARE_PROVIDER_SITE_OTHER): Payer: Medicare Other | Admitting: Family Medicine

## 2021-07-28 ENCOUNTER — Other Ambulatory Visit: Payer: Self-pay

## 2021-07-28 ENCOUNTER — Encounter: Payer: Self-pay | Admitting: Family Medicine

## 2021-07-28 ENCOUNTER — Other Ambulatory Visit: Payer: Self-pay | Admitting: Family Medicine

## 2021-07-28 VITALS — BP 140/80 | HR 70 | Ht 67.0 in | Wt 263.6 lb

## 2021-07-28 VITALS — BP 138/85 | HR 78 | Temp 98.0°F | Ht 67.0 in | Wt 262.0 lb

## 2021-07-28 DIAGNOSIS — F3341 Major depressive disorder, recurrent, in partial remission: Secondary | ICD-10-CM | POA: Diagnosis not present

## 2021-07-28 DIAGNOSIS — K432 Incisional hernia without obstruction or gangrene: Secondary | ICD-10-CM

## 2021-07-28 DIAGNOSIS — I10 Essential (primary) hypertension: Secondary | ICD-10-CM

## 2021-07-28 DIAGNOSIS — M6208 Separation of muscle (nontraumatic), other site: Secondary | ICD-10-CM

## 2021-07-28 MED ORDER — LISINOPRIL 20 MG PO TABS: 20.0000 mg | ORAL_TABLET | Freq: Every day | ORAL | 1 refills | Status: DC
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
## 2021-07-28 NOTE — Progress Notes (Signed)
    SUBJECTIVE:   CHIEF COMPLAINT / HPI:   BP check Patient has been recording his blood pressures, though he notes that most of the days are during a get together with his family, which he feels like probably increase his blood pressure readings at the time.  He did take them appropriately visiting down in a quiet area for several minutes.  Lowest systolic measurement was 117, mostly averaging 150s-160s.  Patient asymptomatic with elevated blood pressures and also has had no signs of hypotension  Suicidal thoughts Patient was recently around his family for a get-together and his mood is worsened when around them. He did have thoughts of wanting to not be here anymore during that time but would not doing anything to "terminate" himself and that it "is against the programing".   PERTINENT  PMH / PSH: Reviewed  OBJECTIVE:   BP 140/80   Pulse 70   Ht 5\' 7"  (1.702 m)   Wt 263 lb 9.6 oz (119.6 kg)   SpO2 98%   BMI 41.29 kg/m   Gen: well-appearing, NAD CV: RRR, no m/r/g appreciated, no peripheral edema Pulm: CTAB, no wheezes/crackles Psych: Appropriate affect and mood  ASSESSMENT/PLAN:   Hypertension BP 140/80. Patient's home BP log with persistent elevations averaging in the 140s-150s systolic. Patient asymptomatic and has no hypotensive episodes as well.  - Increasing lisinopril to 20mg  daily - BP monitoring and hypotensive symptoms discussed with patient - return in 3-4 weeks for BMP and BP recheck  Depression Stable. Currently on Effexor and Wellbutrin. Patient did have thoughts of not wanting to be around when he was at a family gathering, but has protective factors of his family and strong moral beliefs about suicide.  Patient declined to fill out that part of the PHQ-9, but the answer would likely be a 1.  Patient declines counseling, as he states it was not helpful previously.  Patient was given suicide hotline and crisis center information     , DO [facility]  [facility]

## 2021-07-28 NOTE — Patient Instructions (Addendum)
We will get you scheduled for a CT scan with contrast to better assess your hernia.  We will have you follow up after we get the results to discuss surgery.  We ask that you try to loose some weight prior to surgery.   We have you scheduled for a CT scan at the [facility] located at  [place], [place]  This is on Wednesday the 19th  You will need to arrive there by 9:45 am and have nothing to eat or drink for 4 hours prior. You will need to pick up a prep kit for this exam.  We will see you for a follow up. See your appointment below.

## 2021-07-28 NOTE — Assessment & Plan Note (Signed)
BP 140/80. Patient's home BP log with persistent elevations averaging in the 140s-150s systolic. Patient asymptomatic and has no hypotensive episodes as well.  - Increasing lisinopril to 20mg  daily - BP monitoring and hypotensive symptoms discussed with patient - return in 3-4 weeks for BMP and BP recheck

## 2021-07-28 NOTE — Assessment & Plan Note (Signed)
Stable. Currently on Effexor and Wellbutrin. Patient did have thoughts of not wanting to be around when he was at a family gathering, but has protective factors of his family and strong moral beliefs about suicide.  Patient declined to fill out that part of the PHQ-9, but the answer would likely be a 1.  Patient declines counseling, as he states it was not helpful previously.  Patient was given suicide hotline and crisis center information

## 2021-07-28 NOTE — Patient Instructions (Addendum)
I am increasing her dose of lisinopril to 20 mg once daily.  Patient is here to check her blood pressures consistently during the next couple of weeks and follow-up in our clinic in the next 3 to 4 weeks.  At that time we will also check on your kidney function and electrolytes to make sure you are tolerating the medication well.  80 and have any symptoms of hypotension including vision changes, feeling like you are going to pass out, headaches please call our office or go to the ED.     If you are feeling suicidal or depression symptoms worsen please immediately go to:   If you are thinking about harming yourself or having thoughts of suicide, or if you know someone who is, seek help right away. If you are in crisis, make sure you are not left alone.  If someone else is in crisis, make sure he/she/they is not left alone  Call 988 OR 1-800-273-TALK  24 Hour Availability for Walk-IN services  [facility]  [address] [place], [place] [facility] [place] [phone] Crisis [phone]    Other crisis resources:  [facility] [employer] (Domestic Violence, Rape & Victim Assistance [phone]  [facility] [employer] [facility]    (ONLY from 8am-4pm)    [phone]  [facility] Mobile Crisis Unit (24/7)   [phone]  [place] National Suicide Hotline   ([phone] Len Childs)

## 2021-08-04 ENCOUNTER — Ambulatory Visit (HOSPITAL_BASED_OUTPATIENT_CLINIC_OR_DEPARTMENT_OTHER)
Admission: RE | Admit: 2021-08-04 | Discharge: 2021-08-04 | Disposition: A | Discharge status: Home / Self Care | Payer: Medicare Other | Source: Ambulatory Visit | Attending: Surgery | Admitting: Surgery

## 2021-08-04 ENCOUNTER — Other Ambulatory Visit: Payer: Self-pay

## 2021-08-04 ENCOUNTER — Other Ambulatory Visit: Payer: Self-pay | Admitting: Surgery

## 2021-08-04 ENCOUNTER — Encounter (HOSPITAL_BASED_OUTPATIENT_CLINIC_OR_DEPARTMENT_OTHER): Payer: Self-pay

## 2021-08-04 DIAGNOSIS — K432 Incisional hernia without obstruction or gangrene: Secondary | ICD-10-CM

## 2021-08-04 DIAGNOSIS — K760 Fatty (change of) liver, not elsewhere classified: Secondary | ICD-10-CM | POA: Diagnosis not present

## 2021-08-04 DIAGNOSIS — K449 Diaphragmatic hernia without obstruction or gangrene: Secondary | ICD-10-CM | POA: Diagnosis not present

## 2021-08-04 MED ORDER — IOHEXOL 300 MG/ML  SOLN: 100.0000 mL | Freq: Once | INTRAMUSCULAR | Status: DC | PRN

## 2021-08-16 ENCOUNTER — Ambulatory Visit (INDEPENDENT_AMBULATORY_CARE_PROVIDER_SITE_OTHER): Payer: Medicare Other | Admitting: Surgery

## 2021-08-16 ENCOUNTER — Other Ambulatory Visit: Payer: Self-pay

## 2021-08-16 ENCOUNTER — Encounter: Payer: Self-pay | Admitting: Surgery

## 2021-08-16 VITALS — BP 160/90 | HR 85 | Temp 98.7°F | Ht 67.0 in | Wt 263.0 lb

## 2021-08-16 DIAGNOSIS — K432 Incisional hernia without obstruction or gangrene: Secondary | ICD-10-CM | POA: Diagnosis not present

## 2021-08-16 DIAGNOSIS — M6208 Separation of muscle (nontraumatic), other site: Secondary | ICD-10-CM | POA: Diagnosis not present

## 2021-08-16 NOTE — Patient Instructions (Addendum)
Call your primary care provider to see if they need to see you before your surgery.   You have requested to have a Ventral Hernia Repair. This will be done on 08/24/21 by Dr Aleen Campi at [facility]. Please see your (BLUE) Pre-care sheet for more information.  You need to stop your Aspirin 5 days prior to surgery. If your surgery is on 08/24/21, your last dose of Aspirin will be on 08/18/21.  You will need to arrange to be out of work for approximately 1-2 weeks and then you may return with a lifting restriction for 4 more weeks. If you have FMLA or Disability paperwork that needs to be filled out, please have your company fax your paperwork to [phone] or you may drop this by either office. This paperwork will be filled out within 3 days after your surgery has been completed.  Ventral Hernia A ventral hernia (also called an incisional hernia) is a hernia that occurs at the site of a previous surgical cut (incision) in the abdomen. The abdominal wall spans from your lower chest down to your pelvis. If the abdominal wall is weakened from a surgical incision, a hernia can occur. A hernia is a bulge of bowel or muscle tissue pushing out on the weakened part of the abdominal wall. Ventral hernias can get bigger from straining or lifting. Obese and older people are at higher risk for a ventral hernia. People who develop infections after surgery or require repeat incisions at the same site on the abdomen are also at increased risk. CAUSES  A ventral hernia occurs because of weakness in the abdominal wall at an incision site.  SYMPTOMS  Common symptoms include: A visible bulge or lump on the abdominal wall. Pain or tenderness around the lump. Increased discomfort if you cough or make a sudden movement. If the hernia has blocked part of the intestine, a serious complication can occur (incarcerated or strangulated hernia). This can become a problem that requires emergency surgery because the blood flow to the  blocked intestine may be cut off. Symptoms may include: Feeling sick to your stomach (nauseous). Throwing up (vomiting). Stomach swelling (distention) or bloating. Fever. Rapid heartbeat. DIAGNOSIS  Your health care provider will take a medical history and perform a physical exam. Various tests may be ordered, such as: Blood tests. Urine tests. Ultrasonography. X-rays. Computed tomography (CT). TREATMENT  Watchful waiting may be all that is needed for a smaller hernia that does not cause symptoms. Your health care provider may recommend the use of a supportive belt (truss) that helps to keep the abdominal wall intact. For larger hernias or those that cause pain, surgery to repair the hernia is usually recommended. If a hernia becomes strangulated, emergency surgery needs to be done right away. HOME CARE INSTRUCTIONS Avoid putting pressure or strain on the abdominal area. Avoid heavy lifting. Use good body positioning for physical tasks. Ask your health care provider about proper body positioning. Use a supportive belt as directed by your health care provider. Maintain a healthy weight. Eat foods that are high in fiber, such as whole grains, fruits, and vegetables. Fiber helps prevent difficult bowel movements (constipation). Drink enough fluids to keep your urine clear or pale yellow. Follow up with your health care provider as directed. SEEK MEDICAL CARE IF:  Your hernia seems to be getting larger or more painful. SEEK IMMEDIATE MEDICAL CARE IF:  You have abdominal pain that is sudden and sharp. Your pain becomes severe. You have repeated vomiting. You are  sweating a lot. You notice a rapid heartbeat. You develop a fever. MAKE SURE YOU:  Understand these instructions. Will watch your condition. Will get help right away if you are not doing well or get worse.   This information is not intended to replace advice given to you by your health care provider. Make sure you discuss any  questions you have with your health care provider.   Document Released: 09/19/2012 Document Revised: 10/24/2014 Document Reviewed: 09/19/2012 Elsevier Interactive Patient Education 2016 [employer] Inc[employer]    Laparoscopic Ventral Hernia Repair Laparoscopic ventral hernia repair is a surgery to fix a ventral hernia. A ventral hernia, also called an incisional hernia, is a bulge of body tissue or intestines that pushes through the front part of the abdomen. This can happen if the connective tissue covering the muscles over the abdomen has a weak spot or is torn because of a surgical cut (incision) from a previous surgery. Laparoscopic ventral hernia repair is often done soon after diagnosis to stop the hernia from getting bigger, becoming uncomfortable, or becoming an emergency. This surgery usually takes about 2 hours, but the time can vary greatly. LET [facility] CARE PROVIDER KNOW ABOUT: Any allergies you have. All medicines you are taking, including steroids, vitamins, herbs, eye drops, creams, and over-the-counter medicines. Previous problems you or members of your family have had with the use of anesthetics. Any blood disorders you have. Previous surgeries you have had. Medical conditions you have. RISKS AND COMPLICATIONS  Generally, laparoscopic ventral hernia repair is a safe procedure. However, as with any surgical procedure, problems can occur. Possible problems include: Bleeding. Trouble passing urine or having a bowel movement after the surgery. Infection. Pneumonia. Blood clots. Pain in the area of the hernia. A bulge in the area of the hernia that may be caused by a collection of fluid. Injury to intestines or other structures in the abdomen. Return of the hernia after surgery. In some cases, your health care provider may need to stop the laparoscopic procedure and do regular, open surgery. This may be necessary for very difficult hernias, when organs are hard to see, or when  bleeding problems occur during surgery. BEFORE THE PROCEDURE  You may need to have blood tests, urine tests, a chest X-ray, or an electrocardiogram done before the day of the surgery. Ask your health care provider about changing or stopping your regular medicines. This is especially important if you are taking diabetes medicines or blood thinners. You may need to wash with a special type of germ-killing soap. Do not eat or drink anything after midnight the night before the procedure or as directed by your health care provider. Make plans to have someone drive you home after the procedure. PROCEDURE  Small monitors will be put on your body. They are used to check your heart, blood pressure, and oxygen level. An IV access tube will be put into a vein in your hand or arm. Fluids and medicine will flow directly into your body through the IV tube. You will be given medicine that makes you go to sleep (general anesthetic). Your abdomen will be cleaned with a special soap to kill any germs on your skin. Once you are asleep, several small incisions will be made in your abdomen. The large space in your abdomen will be filled with air so that it expands. This gives your health care provider more room and a better view. A thin, lighted tube with a tiny camera on the end (laparoscope) is  put through a small incision in your abdomen. The camera on the laparoscope sends a picture to a TV screen in the operating room. This gives your health care provider a good view inside your abdomen. Hollow tubes are put through the other small incisions in your abdomen. The tools needed for the procedure are put through these tubes. Your health care provider puts the tissue or intestines that formed the hernia back in place. A screen-like patch (mesh) is used to close the hernia. This helps make the area stronger. Stitches, tacks, or staples are used to keep the mesh in place. Medicine and a bandage (dressing) or skin glue  will be put over the incisions. AFTER THE PROCEDURE  You will stay in a recovery area until the anesthetic wears off. Your blood pressure and pulse will be checked often. You may be able to go home the same day or may need to stay in the hospital for 1-2 days after surgery. Your health care provider will decide when you can go home. You may feel some pain. You may be given medicine for pain. You will be urged to do breathing exercises that involve taking deep breaths. This helps prevent a lung infection after a surgery. You may have to wear compression stockings while you are in the hospital. These stockings help keep blood clots from forming in your legs.   This information is not intended to replace advice given to you by your health care provider. Make sure you discuss any questions you have with your health care provider.   Document Released: 09/19/2012 Document Revised: 10/08/2013 Document Reviewed: 09/19/2012 Elsevier Interactive Patient Education [employer]

## 2021-08-16 NOTE — Progress Notes (Signed)
08/16/2021  History of Present Illness: Kyle Kennedy is a 50 y.o. male presenting for follow up of ventral hernia and diastasis recti.  He was last seen on 07/28/21 and CT scan was obtained to better evaluate his anatomy for surgical planning.  CT scan was done on 08/04/21.  I have personally viewed the images.  The patient has a small upper abdominal ventral hernia containing fat within an area of diastasis recti of the upper abdomen.  There may be a small umbilical hernia.  There's also mention of a right inguinal hernia, but on exam he is clinically negative, he's not had any symptoms on the right groin, and he reports having a hernia repair on that site many years ago.  Otherwise he denies any new symptoms, denies any worsening abdominal pain, nausea, vomiting, fevers, or chills.  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   Wears dentures      Past Surgical History: Past Surgical History:  Procedure Laterality Date   APPENDECTOMY     CHOLECYSTECTOMY     ESOPHAGEAL DILATION     ESOPHAGOGASTRODUODENOSCOPY (EGD) WITH PROPOFOL N/A 09/02/2018   Procedure: ESOPHAGOGASTRODUODENOSCOPY (EGD) WITH PROPOFOL;  Surgeon: Gupta, Rajesh, MD;  Location: [facility];  Service: Endoscopy;  Laterality: N/A;   EYE SURGERY Left    FOREIGN BODY REMOVAL  09/02/2018   Procedure: FOREIGN BODY REMOVAL;  Surgeon: Gupta, Rajesh, MD;  Location: [facility];  Service: Endoscopy;;   HAMMER TOE SURGERY  Left 12/24/2015   Procedure: LEFT FOURTH AND FIFTH HAMMER TOE CORRECTION AND FLEXOR TENDON RELEASES ;  Surgeon: John Hewitt, MD;  Location: [facility];  Service: Orthopedics;  Laterality: Left;   HERNIA REPAIR     inguinal   JOINT REPLACEMENT     REPLACEMENT TOTAL KNEE  10/2013   right knee   SPLENECTOMY, TOTAL  1982   TOE SURGERY     left   UPPER GASTROINTESTINAL ENDOSCOPY      Home Medications: Prior to Admission medications   Medication Sig Start Date End Date Taking? Authorizing Provider  allopurinol (ZYLOPRIM) 100 MG tablet TAKE 1 TABLET BY MOUTH DAILY 04/26/19  Yes Anderson, Hannah C, DO  amLODipine (NORVASC) 10 MG tablet TAKE 1 TABLET(10 MG) BY MOUTH DAILY 07/09/21  Yes Maxwell, Allee, MD  aspirin 81 MG tablet Take 1 tablet (81 mg total) by mouth daily. 08/18/14  Yes Joyner, James R, MD  buPROPion (WELLBUTRIN XL) 150 MG 24 hr tablet TAKE 1 TABLET(150 MG) BY MOUTH DAILY 07/06/21  Yes Lynn, Catherine, MD  calamine lotion Apply 1 application topically as needed for itching. 06/24/21  Yes Maxwell, Allee, MD  esomeprazole (NEXIUM) 40 MG capsule TAKE ONE CAPSULE BY MOUTH TWICE DAILY 05/10/21  Yes Maxwell,   Allee, MD  fluticasone (FLONASE) 50 MCG/ACT nasal spray SHAKE LIQUID AND USE 1 SPRAY IN EACH NOSTRIL DAILY 06/26/21  Yes Maxwell, Allee, MD  gabapentin (NEURONTIN) 600 MG tablet Take 1.5 tablets (900 mg total) by mouth daily as needed. 02/24/21  Yes Peggyann Shoals C, DO  ibuprofen (ADVIL) 600 MG tablet Take 1 tablet (600 mg total) by mouth every 8 (eight) hours as needed. 01/16/20  Yes Peggyann Shoals C, DO  levocetirizine (XYZAL) 5 MG tablet TAKE 1 TABLET(5 MG) BY MOUTH EVERY EVENING 09/07/20  Yes Peggyann Shoals C, DO  lisinopril (ZESTRIL) 20 MG tablet Take 1 tablet (20 mg total) by mouth at bedtime. 07/28/21  Yes Lilland, Alana, DO  Multiple Vitamin (MULTI-VITAMIN DAILY PO) Take by mouth.   Yes [provider]  simvastatin (ZOCOR) 40 MG tablet Take one daily. 07/12/21   Yes Maxwell, Allee, MD  triamcinolone (KENALOG) 0.025 % ointment Apply 1 application topically 2 (two) times daily. 06/24/21  Yes [name], MD  venlafaxine (EFFEXOR) 75 MG tablet Take 3 tablets (225mg ) by mouth daily. 02/19/21  Yes 04/21/21, DO    Allergies: No Known Allergies  Review of Systems: Review of Systems  Constitutional:  Negative for chills and fever.  HENT:  Negative for hearing loss.   Respiratory:  Negative for shortness of breath.   Cardiovascular:  Negative for chest pain.  Gastrointestinal:  Negative for abdominal pain, constipation, diarrhea, nausea and vomiting.  Genitourinary:  Negative for dysuria.  Musculoskeletal:  Negative for myalgias.  Skin:  Negative for rash.  Neurological:  Negative for dizziness.  Psychiatric/Behavioral:  Negative for depression.    Physical Exam BP (!) 160/90   Pulse 85   Temp 98.7 F (37.1 C)   Ht 5\' 7"  (1.702 m)   Wt 263 lb (119.3 kg)   SpO2 98%   BMI 41.19 kg/m  CONSTITUTIONAL: No acute distress, in good spirits. HEENT:  Normocephalic, atraumatic, extraocular motion intact. NECK:  trachea is midline, no jugular venous distention. RESPIRATORY:  Lungs are clear, and breath sounds are equal bilaterally. Normal respiratory effort without pathologic use of accessory muscles. CARDIOVASCULAR: Heart is regular without murmurs, gallops, or rubs. GI: The abdomen is soft, obese, non-distended, non-tender to palpation.  Midline incision is well healed.  He has diastasis recti and within, in the mid upper abdomen, a ventral hernia with fat.  No significant pain with palpation, the area is soft without evidence of strangulation, but it is incarcerated.  MUSCULOSKELETAL:  Normal gait, no peripheral edema. NEUROLOGIC:  Motor and sensation is grossly normal.  Cranial nerves are grossly intact. PSYCH:  Alert and oriented to person, place and time. Affect is normal.  Labs/Imaging: CT abdomen/pelvis 08/04/21: IMPRESSION: 1. Small  midline upper abdominal ventral hernia containing fat and located at roughly the T12 vertebral body level. Opening in the abdominal wall musculature measures approximately 11 mm in height. 2. Small right inguinal hernia containing fat. 3. Hepatic steatosis.n/pelvis 08/04/21:   Assessment and Plan: This is a 50 y.o. male with a ventral hernia and diastasis recti.  --Discussed with the patient the findings on his CT scan.  I have personally viewed the images.  He does have diastasis recti and within that space, a ventral hernia, incisional in nature given his prior exlap.  Discussed with him that based on the findings, I think we can proceed with a robotic assisted ventral hernia repair.  Reviewed with him the surgery at length, that we would plicate the rectus muscles as  well and place mesh underneath to reinforce the repair.  Reviewed the risks of bleeding, infection, injury to surrounding structures, possible overnight hospital stay, post-op activity restrictions, use of an abdominal binder, and he's willing to proceed. --Will need medical clearance.  He's on ASA 81 mg and would need to stop 5 days prior to surgery. --Will tentatively schedule him for surgery on 08/24/21.  Last dose of Aspirin on 08/18/21.  This would be pending medical clearance.  Patient understands this and he's in agreement.  All questions answered.  I spent 40 minutes dedicated to the care of this patient on the date of this encounter to include pre-visit review of records, face-to-face time with the patient discussing diagnosis and management, and any post-visit coordination of care.   Rickard Kennerly Luis Everlene Cunning, MD [facility]     

## 2021-08-16 NOTE — Progress Notes (Signed)
Request for Medical Clearance has been faxed to Dr [name].

## 2021-08-16 NOTE — H&P (View-Only) (Signed)
08/16/2021  History of Present Illness: Kyle Kennedy is a 50 y.o. male presenting for follow up of ventral hernia and diastasis recti.  He was last seen on 07/28/21 and CT scan was obtained to better evaluate his anatomy for surgical planning.  CT scan was done on 08/04/21.  I have personally viewed the images.  The patient has a small upper abdominal ventral hernia containing fat within an area of diastasis recti of the upper abdomen.  There may be a small umbilical hernia.  There's also mention of a right inguinal hernia, but on exam he is clinically negative, he's not had any symptoms on the right groin, and he reports having a hernia repair on that site many years ago.  Otherwise he denies any new symptoms, denies any worsening abdominal pain, nausea, vomiting, fevers, or chills.  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   Wears dentures      Past Surgical History: Past Surgical History:  Procedure Laterality Date   APPENDECTOMY     CHOLECYSTECTOMY     ESOPHAGEAL DILATION     ESOPHAGOGASTRODUODENOSCOPY (EGD) WITH PROPOFOL N/A 09/02/2018   Procedure: ESOPHAGOGASTRODUODENOSCOPY (EGD) WITH PROPOFOL;  Surgeon: Lynann Bologna, MD;  Location: [facility];  Service: Endoscopy;  Laterality: N/A;   EYE SURGERY Left    FOREIGN BODY REMOVAL  09/02/2018   Procedure: FOREIGN BODY REMOVAL;  Surgeon: Lynann Bologna, MD;  Location: [facility];  Service: Endoscopy;;   HAMMER TOE SURGERY  Left 12/24/2015   Procedure: LEFT FOURTH AND FIFTH HAMMER TOE CORRECTION AND FLEXOR TENDON RELEASES ;  Surgeon: Toni Arthurs, MD;  Location: [facility];  Service: Orthopedics;  Laterality: Left;   HERNIA REPAIR     inguinal   JOINT REPLACEMENT     REPLACEMENT TOTAL KNEE  10/2013   right knee   SPLENECTOMY, TOTAL  1982   TOE SURGERY     left   UPPER GASTROINTESTINAL ENDOSCOPY      Home Medications: Prior to Admission medications   Medication Sig Start Date End Date Taking? Authorizing Provider  allopurinol (ZYLOPRIM) 100 MG tablet TAKE 1 TABLET BY MOUTH DAILY 04/26/19  Yes Peggyann Shoals C, DO  amLODipine (NORVASC) 10 MG tablet TAKE 1 TABLET(10 MG) BY MOUTH DAILY 07/09/21  Yes [name], MD  aspirin 81 MG tablet Take 1 tablet (81 mg total) by mouth daily. 08/18/14  Yes Jamal Collin, MD  buPROPion (WELLBUTRIN XL) 150 MG 24 hr tablet TAKE 1 TABLET(150 MG) BY MOUTH DAILY 07/06/21  Yes Fayette Pho, MD  calamine lotion Apply 1 application topically as needed for itching. 06/24/21  Yes [name], MD  esomeprazole (NEXIUM) 40 MG capsule TAKE ONE CAPSULE BY MOUTH TWICE DAILY 05/10/21  Yes Maxwell,  Allee, MD  fluticasone (FLONASE) 50 MCG/ACT nasal spray SHAKE LIQUID AND USE 1 SPRAY IN EACH NOSTRIL DAILY 06/26/21  Yes Maxwell, Allee, MD  gabapentin (NEURONTIN) 600 MG tablet Take 1.5 tablets (900 mg total) by mouth daily as needed. 02/24/21  Yes Peggyann Shoals C, DO  ibuprofen (ADVIL) 600 MG tablet Take 1 tablet (600 mg total) by mouth every 8 (eight) hours as needed. 01/16/20  Yes Peggyann Shoals C, DO  levocetirizine (XYZAL) 5 MG tablet TAKE 1 TABLET(5 MG) BY MOUTH EVERY EVENING 09/07/20  Yes Peggyann Shoals C, DO  lisinopril (ZESTRIL) 20 MG tablet Take 1 tablet (20 mg total) by mouth at bedtime. 07/28/21  Yes Lilland, Alana, DO  Multiple Vitamin (MULTI-VITAMIN DAILY PO) Take by mouth.   Yes [provider]  simvastatin (ZOCOR) 40 MG tablet Take one daily. 07/12/21   Yes Maxwell, Allee, MD  triamcinolone (KENALOG) 0.025 % ointment Apply 1 application topically 2 (two) times daily. 06/24/21  Yes [name], MD  venlafaxine (EFFEXOR) 75 MG tablet Take 3 tablets (225mg ) by mouth daily. 02/19/21  Yes 04/21/21, DO    Allergies: No Known Allergies  Review of Systems: Review of Systems  Constitutional:  Negative for chills and fever.  HENT:  Negative for hearing loss.   Respiratory:  Negative for shortness of breath.   Cardiovascular:  Negative for chest pain.  Gastrointestinal:  Negative for abdominal pain, constipation, diarrhea, nausea and vomiting.  Genitourinary:  Negative for dysuria.  Musculoskeletal:  Negative for myalgias.  Skin:  Negative for rash.  Neurological:  Negative for dizziness.  Psychiatric/Behavioral:  Negative for depression.    Physical Exam BP (!) 160/90   Pulse 85   Temp 98.7 F (37.1 C)   Ht 5\' 7"  (1.702 m)   Wt 263 lb (119.3 kg)   SpO2 98%   BMI 41.19 kg/m  CONSTITUTIONAL: No acute distress, in good spirits. HEENT:  Normocephalic, atraumatic, extraocular motion intact. NECK:  trachea is midline, no jugular venous distention. RESPIRATORY:  Lungs are clear, and breath sounds are equal bilaterally. Normal respiratory effort without pathologic use of accessory muscles. CARDIOVASCULAR: Heart is regular without murmurs, gallops, or rubs. GI: The abdomen is soft, obese, non-distended, non-tender to palpation.  Midline incision is well healed.  He has diastasis recti and within, in the mid upper abdomen, a ventral hernia with fat.  No significant pain with palpation, the area is soft without evidence of strangulation, but it is incarcerated.  MUSCULOSKELETAL:  Normal gait, no peripheral edema. NEUROLOGIC:  Motor and sensation is grossly normal.  Cranial nerves are grossly intact. PSYCH:  Alert and oriented to person, place and time. Affect is normal.  Labs/Imaging: CT abdomen/pelvis 08/04/21: IMPRESSION: 1. Small  midline upper abdominal ventral hernia containing fat and located at roughly the T12 vertebral body level. Opening in the abdominal wall musculature measures approximately 11 mm in height. 2. Small right inguinal hernia containing fat. 3. Hepatic steatosis.n/pelvis 08/04/21:   Assessment and Plan: This is a 50 y.o. male with a ventral hernia and diastasis recti.  --Discussed with the patient the findings on his CT scan.  I have personally viewed the images.  He does have diastasis recti and within that space, a ventral hernia, incisional in nature given his prior exlap.  Discussed with him that based on the findings, I think we can proceed with a robotic assisted ventral hernia repair.  Reviewed with him the surgery at length, that we would plicate the rectus muscles as  well and place mesh underneath to reinforce the repair.  Reviewed the risks of bleeding, infection, injury to surrounding structures, possible overnight hospital stay, post-op activity restrictions, use of an abdominal binder, and he's willing to proceed. --Will need medical clearance.  He's on ASA 81 mg and would need to stop 5 days prior to surgery. --Will tentatively schedule him for surgery on 08/24/21.  Last dose of Aspirin on 08/18/21.  This would be pending medical clearance.  Patient understands this and he's in agreement.  All questions answered.  I spent 40 minutes dedicated to the care of this patient on the date of this encounter to include pre-visit review of records, face-to-face time with the patient discussing diagnosis and management, and any post-visit coordination of care.   Howie Ill, MD [facility]

## 2021-08-17 ENCOUNTER — Encounter: Payer: Self-pay | Admitting: Student

## 2021-08-17 ENCOUNTER — Telehealth: Payer: Self-pay | Admitting: Surgery

## 2021-08-17 NOTE — Telephone Encounter (Signed)
Patient has been advised of Pre-Admission date/time, COVID Testing date and Surgery date.  Surgery Date: 08/24/21 Preadmission Testing Date: 08/20/21 in person arrive 9:40 am.  Patient made aware where to go.   Covid Testing Date: 08/20/21 arrive @ 9:40 am - patient advised to go to [facility] ([address] [facility])   Patient has been made aware to call [phone], between 1-3:00pm the day before surgery, to find out what time to arrive for surgery.

## 2021-08-18 NOTE — Telephone Encounter (Signed)
Attempted calling patient for medical clearance of surgery. Will call again tomorrow.

## 2021-08-19 ENCOUNTER — Telehealth: Payer: Self-pay | Admitting: Student

## 2021-08-19 NOTE — Telephone Encounter (Signed)
  Patient Name: Kyle Kennedy Date of Birth: 08-06-1971 Date of Visit: @TODAY @ PCP: , MD  Chief Complaint: preoperative evaluation Surgeon:  Dr. [name] Surgery: Ventral hernia repair  Date of Procedure: 08/24/21  Subjective: Kyle Kennedy is a pleasant 50 y.o. with medical history significant for OSA, HLD, HTN, depression, GERD, TBI. I am speaking to patient on the phone for medical clearance for ventral hernia repair.  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    Prior surgeries: EGD, left fourth and fifth hammer toe correction, splenectomy, knee surgery, inguinal hernia repair   Difficulty with surgical procedures in the past: No  History of difficulty with anesthesia: No  History of venous thromboembolic disease: No  History of easy bleeding with procedures: No Family history of venous thromboembolic disease: No Family history of bleeding diathesis: No   ROS:  Denies chest pain, dyspnea on exertion, chronic cough, history of venous thromboembolism, family history of venous thromboembolism, personal or family history of easy bleeding or bruising, or personal history or family history of difficulty with anesthesia.  I have reviewed the patient's  medical, surgical, family, and social history as appropriate.   Patient is low risk for a low risk surgery with 05/05/2013 Perioperative Risk score of 0.1.  The patient has been medically optimized for this procedure. In addition,  I make the following recommendations for further evaluation and medication adjustments prior to their planned procedure: Hold Lisinopril, Ibuprofen before surgery  Can continue Amlodipine, Nexium, venlafaxine, bupropion, allopurinol, statin, flonase Low risk--does not need to restart ASA after surgery  Chales Abrahams, MD  [facility] Medicine Teaching Service

## 2021-08-20 ENCOUNTER — Other Ambulatory Visit: Payer: Self-pay

## 2021-08-20 ENCOUNTER — Other Ambulatory Visit
Admission: RE | Admit: 2021-08-20 | Discharge: 2021-08-20 | Disposition: A | Discharge status: Home / Self Care | Payer: Medicare Other | Source: Ambulatory Visit | Attending: Surgery | Admitting: Surgery

## 2021-08-20 ENCOUNTER — Encounter
Admission: RE | Admit: 2021-08-20 | Discharge: 2021-08-20 | Disposition: A | Discharge status: Home / Self Care | Payer: Medicare Other | Source: Ambulatory Visit | Attending: Surgery | Admitting: Surgery

## 2021-08-20 ENCOUNTER — Other Ambulatory Visit: Payer: Medicare Other

## 2021-08-20 VITALS — BP 142/84 | HR 73 | Temp 98.0°F | Resp 18 | Ht 67.0 in | Wt 261.7 lb

## 2021-08-20 DIAGNOSIS — Z20822 Contact with and (suspected) exposure to covid-19: Secondary | ICD-10-CM

## 2021-08-20 DIAGNOSIS — I10 Essential (primary) hypertension: Secondary | ICD-10-CM | POA: Diagnosis not present

## 2021-08-20 DIAGNOSIS — Z01818 Encounter for other preprocedural examination: Secondary | ICD-10-CM | POA: Diagnosis not present

## 2021-08-20 DIAGNOSIS — Z01812 Encounter for preprocedural laboratory examination: Secondary | ICD-10-CM

## 2021-08-20 HISTORY — DX: Sleep apnea, unspecified: G47.30

## 2021-08-20 LAB — CBC
HCT: 41.7 % (ref 39.0–52.0)
Hemoglobin: 14.4 g/dL (ref 13.0–17.0)
MCH: 29.7 pg (ref 26.0–34.0)
MCHC: 34.5 g/dL (ref 30.0–36.0)
MCV: 86 fL (ref 80.0–100.0)
Platelets: 234 10*3/uL (ref 150–400)
RBC: 4.85 MIL/uL (ref 4.22–5.81)
RDW: 13.2 % (ref 11.5–15.5)
WBC: 5.9 10*3/uL (ref 4.0–10.5)
nRBC: 0 % (ref 0.0–0.2)

## 2021-08-20 LAB — SARS CORONAVIRUS 2 (TAT 6-24 HRS): SARS Coronavirus 2: NEGATIVE

## 2021-08-20 NOTE — Patient Instructions (Addendum)
Your procedure is scheduled on: Tuesday August 24, 2021. Report to Day Surgery inside [facility] 2nd floor. To find out your arrival time please call [phone] between 1PM - 3PM on Monday August 23, 2021.  Remember: Instructions that are not followed completely may result in serious medical risk,  up to and including death, or upon the discretion of your surgeon and anesthesiologist your  surgery may need to be rescheduled.     _X__ 1. Do not eat food after midnight the night before your procedure.                 No chewing gum or hard candies. You may drink clear liquids up to 2 hours                 before you are scheduled to arrive for your surgery- DO not drink clear                 liquids within 2 hours of the start of your surgery.                 Clear Liquids include:  water, apple juice without pulp, clear Gatorade, G2 or                  Gatorade Zero (avoid Red/Purple/Blue), Black Coffee or Tea (Do not add                 anything to coffee or tea).  __X__2.  On the morning of surgery brush your teeth with toothpaste and water, you                may rinse your mouth with mouthwash if you wish.  Do not swallow any toothpaste or mouthwash.     _X__ 3.  No Alcohol for 24 hours before or after surgery.   _X__ 4.  Do Not Smoke or use e-cigarettes For 24 Hours Prior to Your Surgery.                 Do not use any chewable tobacco products for at least 6 hours prior to                 Surgery.  _X__  5.  Do not use any recreational drugs (marijuana, cocaine, heroin, ecstasy, MDMA or other)                For at least one week prior to your surgery.  Combination of these drugs with anesthesia                May have life threatening results.  __X__6.  Notify your doctor if there is any change in your medical condition      (cold, fever, infections).     Do not wear jewelry, make-up, hairpins, clips or nail polish. Do not wear lotions, powders,  or perfumes. You may wear deodorant. Do not shave 48 hours prior to surgery. Men may shave face and neck. Do not bring valuables to the hospital.    [facility] is not responsible for any belongings or valuables.  Contacts, dentures or bridgework may not be worn into surgery. Leave your suitcase in the car. After surgery it may be brought to your room. For patients admitted to the hospital, discharge time is determined by your treatment team.   Patients discharged the day of surgery will not be allowed to drive home.   Make arrangements for someone to be with  you for the first 24 hours of your Same Day Discharge.   __X__ Take these medicines the morning of surgery with A SIP OF WATER:    1. amLODipine (NORVASC) 10 MG   2. buPROPion (WELLBUTRIN XL) 150 MG  3. esomeprazole (NEXIUM) 40 MG  4. gabapentin (NEURONTIN) 600 MG   5. venlafaxine (EFFEXOR) 75 MG Take 3 tablets (225mg )  6. simvastatin (ZOCOR) 40 MG  ____ Fleet Enema (as directed)   __X__ Use CHG Soap (or wipes) as directed  ____ Use Benzoyl Peroxide Gel as instructed  ____ Use inhalers on the day of surgery  ____ Stop metformin 2 days prior to surgery    ____ Take 1/2 of usual insulin dose the night before surgery. No insulin the morning          of surgery.   ____ Call your PCP, cardiologist, or Pulmonologist if taking Coumadin/Plavix/aspirin and ask when to stop before your surgery.   __X__ One Week prior to surgery- Stop Anti-inflammatories such as Ibuprofen, Aleve, Advil, Motrin, meloxicam (MOBIC), diclofenac, etodolac, ketorolac, Toradol, Daypro, piroxicam, Goody's or BC powders. OK TO USE TYLENOL IF NEEDED   __X__ Stop supplements or vitamins until after surgery. Omega-3 300 MG    ____ Bring C-Pap to the hospital.    If you have any questions regarding your pre-procedure instructions,  Please call Pre-admit Testing at [phone].

## 2021-08-24 ENCOUNTER — Other Ambulatory Visit: Payer: Self-pay

## 2021-08-24 ENCOUNTER — Encounter: Payer: Self-pay | Admitting: Surgery

## 2021-08-24 ENCOUNTER — Observation Stay
Admission: RE | Admit: 2021-08-24 | Discharge: 2021-08-24 | Disposition: A | Discharge status: Home / Self Care | Payer: Medicare Other | Attending: Surgery | Admitting: Surgery

## 2021-08-24 ENCOUNTER — Ambulatory Visit: Payer: Medicare Other | Admitting: Anesthesiology

## 2021-08-24 ENCOUNTER — Telehealth: Payer: Self-pay

## 2021-08-24 ENCOUNTER — Encounter
Admission: RE | Disposition: A | Discharge status: Home / Self Care | Payer: Self-pay | Source: Home / Self Care | Attending: Surgery

## 2021-08-24 DIAGNOSIS — M6208 Separation of muscle (nontraumatic), other site: Secondary | ICD-10-CM | POA: Diagnosis not present

## 2021-08-24 DIAGNOSIS — Z791 Long term (current) use of non-steroidal anti-inflammatories (NSAID): Secondary | ICD-10-CM | POA: Diagnosis not present

## 2021-08-24 DIAGNOSIS — Z79899 Other long term (current) drug therapy: Secondary | ICD-10-CM | POA: Diagnosis not present

## 2021-08-24 DIAGNOSIS — K430 Incisional hernia with obstruction, without gangrene: Secondary | ICD-10-CM | POA: Diagnosis not present

## 2021-08-24 DIAGNOSIS — Q7959 Other congenital malformations of abdominal wall: Secondary | ICD-10-CM | POA: Diagnosis not present

## 2021-08-24 DIAGNOSIS — F32A Depression, unspecified: Secondary | ICD-10-CM | POA: Diagnosis not present

## 2021-08-24 DIAGNOSIS — K432 Incisional hernia without obstruction or gangrene: Principal | ICD-10-CM

## 2021-08-24 DIAGNOSIS — K439 Ventral hernia without obstruction or gangrene: Secondary | ICD-10-CM | POA: Diagnosis not present

## 2021-08-24 DIAGNOSIS — Z7982 Long term (current) use of aspirin: Secondary | ICD-10-CM | POA: Insufficient documentation

## 2021-08-24 DIAGNOSIS — Z96651 Presence of right artificial knee joint: Secondary | ICD-10-CM | POA: Diagnosis not present

## 2021-08-24 DIAGNOSIS — I10 Essential (primary) hypertension: Secondary | ICD-10-CM | POA: Diagnosis not present

## 2021-08-24 HISTORY — PX: XI ROBOTIC ASSISTED VENTRAL HERNIA: SHX6789

## 2021-08-24 HISTORY — PX: INSERTION OF MESH: SHX5868

## 2021-08-24 SURGERY — REPAIR, HERNIA, VENTRAL, ROBOT-ASSISTED
Anesthesia: General

## 2021-08-24 MED ORDER — BUPIVACAINE-EPINEPHRINE 0.25% -1:200000 IJ SOLN
INTRAMUSCULAR | Status: DC | PRN
  Administered 2021-08-24: 3 mL

## 2021-08-24 MED ORDER — EPHEDRINE SULFATE 50 MG/ML IJ SOLN
INTRAMUSCULAR | Status: DC | PRN
  Administered 2021-08-24 (×2): 10 mg via INTRAVENOUS

## 2021-08-24 MED ORDER — OXYCODONE HCL 5 MG PO TABS: 5.0000 mg | ORAL_TABLET | ORAL | 0 refills | Status: DC | PRN

## 2021-08-24 MED ORDER — SODIUM CHLORIDE 0.9 % IV SOLN
INTRAVENOUS | Status: DC | PRN
  Administered 2021-08-24: 70 mL

## 2021-08-24 MED ORDER — OXYCODONE HCL 5 MG PO TABS
5.0000 mg | ORAL_TABLET | ORAL | Status: DC | PRN
  Administered 2021-08-24: 5 mg via ORAL

## 2021-08-24 MED ORDER — ONDANSETRON HCL 4 MG/2ML IJ SOLN
INTRAMUSCULAR | Status: AC
  Filled 2021-08-24: qty 2

## 2021-08-24 MED ORDER — KETOROLAC TROMETHAMINE 30 MG/ML IJ SOLN
INTRAMUSCULAR | Status: DC | PRN
  Administered 2021-08-24: 30 mg via INTRAVENOUS

## 2021-08-24 MED ORDER — DEXAMETHASONE SODIUM PHOSPHATE 10 MG/ML IJ SOLN
INTRAMUSCULAR | Status: AC
  Filled 2021-08-24: qty 1

## 2021-08-24 MED ORDER — BUPIVACAINE LIPOSOME 1.3 % IJ SUSP: 20.0000 mL | Freq: Once | INTRAMUSCULAR | Status: DC

## 2021-08-24 MED ORDER — CHLORHEXIDINE GLUCONATE CLOTH 2 % EX PADS
6.0000 | MEDICATED_PAD | Freq: Once | CUTANEOUS | Status: AC
  Administered 2021-08-24: 6 via TOPICAL

## 2021-08-24 MED ORDER — MIDAZOLAM HCL 2 MG/2ML IJ SOLN
INTRAMUSCULAR | Status: DC | PRN
  Administered 2021-08-24: 2 mg via INTRAVENOUS

## 2021-08-24 MED ORDER — LACTATED RINGERS IV SOLN: INTRAVENOUS | Status: DC

## 2021-08-24 MED ORDER — MIDAZOLAM HCL 2 MG/2ML IJ SOLN
INTRAMUSCULAR | Status: AC
  Filled 2021-08-24: qty 2

## 2021-08-24 MED ORDER — DEXAMETHASONE SODIUM PHOSPHATE 10 MG/ML IJ SOLN
INTRAMUSCULAR | Status: DC | PRN
  Administered 2021-08-24: 10 mg via INTRAVENOUS

## 2021-08-24 MED ORDER — ROCURONIUM BROMIDE 10 MG/ML (PF) SYRINGE
PREFILLED_SYRINGE | INTRAVENOUS | Status: AC
  Filled 2021-08-24: qty 10

## 2021-08-24 MED ORDER — ACETAMINOPHEN 500 MG PO TABS
ORAL_TABLET | ORAL | Status: AC
  Administered 2021-08-24: 1000 mg via ORAL
  Filled 2021-08-24: qty 2

## 2021-08-24 MED ORDER — BUPIVACAINE LIPOSOME 1.3 % IJ SUSP
INTRAMUSCULAR | Status: AC
  Filled 2021-08-24: qty 20

## 2021-08-24 MED ORDER — ORAL CARE MOUTH RINSE: 15.0000 mL | Freq: Once | OROMUCOSAL | Status: AC

## 2021-08-24 MED ORDER — PROPOFOL 10 MG/ML IV BOLUS
INTRAVENOUS | Status: DC | PRN
  Administered 2021-08-24: 20 mg via INTRAVENOUS
  Administered 2021-08-24: 200 mg via INTRAVENOUS
  Administered 2021-08-24: 20 mg via INTRAVENOUS

## 2021-08-24 MED ORDER — SODIUM CHLORIDE FLUSH 0.9 % IV SOLN
INTRAVENOUS | Status: AC
  Filled 2021-08-24: qty 30

## 2021-08-24 MED ORDER — PHENYLEPHRINE HCL-NACL 20-0.9 MG/250ML-% IV SOLN
INTRAVENOUS | Status: DC | PRN
  Administered 2021-08-24: 30 ug/min via INTRAVENOUS

## 2021-08-24 MED ORDER — FENTANYL CITRATE (PF) 100 MCG/2ML IJ SOLN
INTRAMUSCULAR | Status: AC
  Filled 2021-08-24: qty 2

## 2021-08-24 MED ORDER — GABAPENTIN 300 MG PO CAPS
ORAL_CAPSULE | ORAL | Status: AC
  Filled 2021-08-24: qty 1

## 2021-08-24 MED ORDER — PHENYLEPHRINE HCL-NACL 20-0.9 MG/250ML-% IV SOLN
INTRAVENOUS | Status: AC
  Filled 2021-08-24: qty 250

## 2021-08-24 MED ORDER — LIDOCAINE HCL (CARDIAC) PF 100 MG/5ML IV SOSY
PREFILLED_SYRINGE | INTRAVENOUS | Status: DC | PRN
  Administered 2021-08-24: 50 mg via INTRAVENOUS

## 2021-08-24 MED ORDER — GLYCOPYRROLATE 0.2 MG/ML IJ SOLN
INTRAMUSCULAR | Status: DC | PRN
  Administered 2021-08-24: .2 mg via INTRAVENOUS

## 2021-08-24 MED ORDER — CHLORHEXIDINE GLUCONATE 0.12 % MT SOLN
OROMUCOSAL | Status: AC
  Administered 2021-08-24: 15 mL via OROMUCOSAL
  Filled 2021-08-24: qty 15

## 2021-08-24 MED ORDER — CHLORHEXIDINE GLUCONATE 0.12 % MT SOLN: 15.0000 mL | Freq: Once | OROMUCOSAL | Status: AC

## 2021-08-24 MED ORDER — OXYCODONE HCL 5 MG PO TABS
ORAL_TABLET | ORAL | Status: AC
  Filled 2021-08-24: qty 1

## 2021-08-24 MED ORDER — ONDANSETRON HCL 4 MG/2ML IJ SOLN
INTRAMUSCULAR | Status: DC | PRN
  Administered 2021-08-24: 4 mg via INTRAVENOUS

## 2021-08-24 MED ORDER — ROCURONIUM BROMIDE 100 MG/10ML IV SOLN
INTRAVENOUS | Status: DC | PRN
  Administered 2021-08-24: 70 mg via INTRAVENOUS
  Administered 2021-08-24: 20 mg via INTRAVENOUS
  Administered 2021-08-24 (×4): 15 mg via INTRAVENOUS
  Administered 2021-08-24: 25 mg via INTRAVENOUS

## 2021-08-24 MED ORDER — GABAPENTIN 300 MG PO CAPS: 300.0000 mg | ORAL_CAPSULE | ORAL | Status: DC

## 2021-08-24 MED ORDER — ACETAMINOPHEN 500 MG PO TABS: 1000.0000 mg | ORAL_TABLET | ORAL | Status: AC

## 2021-08-24 MED ORDER — CEFAZOLIN SODIUM-DEXTROSE 2-4 GM/100ML-% IV SOLN
2.0000 g | INTRAVENOUS | Status: AC
  Administered 2021-08-24: 2 g via INTRAVENOUS

## 2021-08-24 MED ORDER — PHENYLEPHRINE HCL (PRESSORS) 10 MG/ML IV SOLN
INTRAVENOUS | Status: DC | PRN
  Administered 2021-08-24: 50 ug via INTRAVENOUS
  Administered 2021-08-24: 100 ug via INTRAVENOUS
  Administered 2021-08-24: 50 ug via INTRAVENOUS

## 2021-08-24 MED ORDER — ACETAMINOPHEN 500 MG PO TABS: 1000.0000 mg | ORAL_TABLET | Freq: Four times a day (QID) | ORAL | Status: AC | PRN

## 2021-08-24 MED ORDER — CEFAZOLIN SODIUM-DEXTROSE 2-4 GM/100ML-% IV SOLN
INTRAVENOUS | Status: AC
  Filled 2021-08-24: qty 100

## 2021-08-24 MED ORDER — SUGAMMADEX SODIUM 200 MG/2ML IV SOLN
INTRAVENOUS | Status: DC | PRN
  Administered 2021-08-24 (×4): 50 mg via INTRAVENOUS

## 2021-08-24 MED ORDER — KETOROLAC TROMETHAMINE 30 MG/ML IJ SOLN
INTRAMUSCULAR | Status: AC
  Filled 2021-08-24: qty 1

## 2021-08-24 MED ORDER — PROPOFOL 10 MG/ML IV BOLUS
INTRAVENOUS | Status: AC
  Filled 2021-08-24: qty 20

## 2021-08-24 MED ORDER — FENTANYL CITRATE (PF) 100 MCG/2ML IJ SOLN: 25.0000 ug | INTRAMUSCULAR | Status: DC | PRN

## 2021-08-24 MED ORDER — FENTANYL CITRATE (PF) 100 MCG/2ML IJ SOLN
INTRAMUSCULAR | Status: DC | PRN
  Administered 2021-08-24 (×2): 50 ug via INTRAVENOUS

## 2021-08-24 MED ORDER — BUPIVACAINE-EPINEPHRINE (PF) 0.25% -1:200000 IJ SOLN
INTRAMUSCULAR | Status: AC
  Filled 2021-08-24: qty 30

## 2021-08-24 MED ORDER — ONDANSETRON HCL 4 MG/2ML IJ SOLN: 4.0000 mg | Freq: Once | INTRAMUSCULAR | Status: DC | PRN

## 2021-08-24 MED ORDER — IBUPROFEN 800 MG PO TABS: 800.0000 mg | ORAL_TABLET | Freq: Three times a day (TID) | ORAL | 1 refills | Status: DC | PRN

## 2021-08-24 SURGICAL SUPPLY — 65 items
ADH SKN CLS APL DERMABOND .7 (GAUZE/BANDAGES/DRESSINGS) ×2
APL PRP STRL LF DISP 70% ISPRP (MISCELLANEOUS)
BLADE SURG SZ11 CARB STEEL (BLADE) ×2 IMPLANT
CANNULA REDUC XI 12-8 STAPL (CANNULA) ×3
CANNULA REDUC XI 12-8MM STAPL (CANNULA) ×1
CANNULA REDUCER 12-8 DVNC XI (CANNULA) ×2 IMPLANT
CHLORAPREP W/TINT 26 (MISCELLANEOUS) ×2 IMPLANT
COVER TIP SHEARS 8 DVNC (MISCELLANEOUS) ×2 IMPLANT
COVER TIP SHEARS 8MM DA VINCI (MISCELLANEOUS) ×4
DEFOGGER SCOPE WARMER CLEARIFY (MISCELLANEOUS) ×4 IMPLANT
DERMABOND ADVANCED (GAUZE/BANDAGES/DRESSINGS) ×2
DERMABOND ADVANCED .7 DNX12 (GAUZE/BANDAGES/DRESSINGS) ×2 IMPLANT
DRAPE ARM DVNC X/XI (DISPOSABLE) ×6 IMPLANT
DRAPE COLUMN DVNC XI (DISPOSABLE) ×2 IMPLANT
DRAPE DA VINCI XI ARM (DISPOSABLE) ×12
DRAPE DA VINCI XI COLUMN (DISPOSABLE) ×4
ELECT CAUTERY BLADE TIP 2.5 (TIP) ×4
ELECT REM PT RETURN 9FT ADLT (ELECTROSURGICAL) ×4
ELECTRODE CAUTERY BLDE TIP 2.5 (TIP) ×2 IMPLANT
ELECTRODE REM PT RTRN 9FT ADLT (ELECTROSURGICAL) ×2 IMPLANT
GAUZE 4X4 16PLY ~~LOC~~+RFID DBL (SPONGE) ×4 IMPLANT
GLOVE SURG SYN 7.0 (GLOVE) ×24 IMPLANT
GLOVE SURG SYN 7.0 PF PI (GLOVE) ×4 IMPLANT
GLOVE SURG SYN 7.5  E (GLOVE) ×32
GLOVE SURG SYN 7.5 E (GLOVE) ×16 IMPLANT
GLOVE SURG SYN 7.5 PF PI (GLOVE) ×4 IMPLANT
GOWN STRL REUS W/ TWL LRG LVL3 (GOWN DISPOSABLE) ×6 IMPLANT
GOWN STRL REUS W/TWL LRG LVL3 (GOWN DISPOSABLE) ×32
GRASPER SUT TROCAR 14GX15 (MISCELLANEOUS) ×4 IMPLANT
IRRIGATION STRYKERFLOW (MISCELLANEOUS) IMPLANT
IRRIGATOR STRYKERFLOW (MISCELLANEOUS)
IV NS 1000ML (IV SOLUTION)
IV NS 1000ML BAXH (IV SOLUTION) IMPLANT
KIT PINK PAD W/HEAD ARE REST (MISCELLANEOUS) ×4
KIT PINK PAD W/HEAD ARM REST (MISCELLANEOUS) ×2 IMPLANT
LABEL OR SOLS (LABEL) ×4 IMPLANT
MANIFOLD NEPTUNE II (INSTRUMENTS) ×4 IMPLANT
MESH VENT LT ST 17.8X22.9CM EL (Mesh General) ×2 IMPLANT
NDL INSUFFLATION 14GA 120MM (NEEDLE) ×2 IMPLANT
NEEDLE HYPO 22GX1.5 SAFETY (NEEDLE) ×4 IMPLANT
NEEDLE INSUFFLATION 14GA 120MM (NEEDLE) ×4 IMPLANT
OBTURATOR OPTICAL STANDARD 8MM (TROCAR) ×4
OBTURATOR OPTICAL STND 8 DVNC (TROCAR) ×2
OBTURATOR OPTICALSTD 8 DVNC (TROCAR) ×2 IMPLANT
PACK LAP CHOLECYSTECTOMY (MISCELLANEOUS) ×4 IMPLANT
PENCIL ELECTRO HAND CTR (MISCELLANEOUS) ×4 IMPLANT
SEAL CANN UNIV 5-8 DVNC XI (MISCELLANEOUS) ×4 IMPLANT
SEAL XI 5MM-8MM UNIVERSAL (MISCELLANEOUS) ×12
SET TUBE SMOKE EVAC HIGH FLOW (TUBING) ×4 IMPLANT
SOLUTION ELECTROLUBE (MISCELLANEOUS) ×4 IMPLANT
SPONGE T-LAP 18X18 ~~LOC~~+RFID (SPONGE) ×4 IMPLANT
STAPLER CANNULA SEAL DVNC XI (STAPLE) ×2 IMPLANT
STAPLER CANNULA SEAL XI (STAPLE) ×4
SUT MNCRL 4-0 (SUTURE) ×4
SUT MNCRL 4-0 27XMFL (SUTURE) ×2
SUT STRATAFIX PDS 30 CT-1 (SUTURE) ×10 IMPLANT
SUT VIC AB 0 CT1 36 (SUTURE) ×2 IMPLANT
SUT VIC AB 3-0 SH 27 (SUTURE) ×4
SUT VIC AB 3-0 SH 27X BRD (SUTURE) IMPLANT
SUT VICRYL 0 AB UR-6 (SUTURE) ×4 IMPLANT
SUT VLOC 90 2/L VL 12 GS22 (SUTURE) ×10 IMPLANT
SUTURE MNCRL 4-0 27XMF (SUTURE) ×2 IMPLANT
TRAY FOLEY SLVR 16FR LF STAT (SET/KITS/TRAYS/PACK) ×4 IMPLANT
WAND RF SURG SPNG DETECT SYS (INSTRUMENTS) ×4 IMPLANT
WATER STERILE IRR 500ML POUR (IV SOLUTION) ×2 IMPLANT

## 2021-08-24 NOTE — Anesthesia Postprocedure Evaluation (Signed)
Anesthesia Post Note  Patient: Kyle Kennedy  Procedure(s) Performed: XI ROBOTIC ASSISTED VENTRAL HERNIA INSERTION OF MESH  Patient location during evaluation: PACU Anesthesia Type: General Level of consciousness: awake and alert and oriented Pain management: pain level controlled Vital Signs Assessment: post-procedure vital signs reviewed and stable Respiratory status: spontaneous breathing, nonlabored ventilation and respiratory function stable Cardiovascular status: blood pressure returned to baseline and stable Postop Assessment: no signs of nausea or vomiting Anesthetic complications: no   No notable events documented.   Last Vitals:  Vitals:   08/24/21 1410 08/24/21 1415  BP:    Pulse:  80  Resp:  18  Temp:    SpO2: 98% 94%    Last Pain:  Vitals:   08/24/21 1400  TempSrc:   PainSc: 0-No pain                 Wilberto Console

## 2021-08-24 NOTE — Interval H&P Note (Signed)
History and Physical Interval Note:  08/24/2021 9:19 AM  [name]  has presented today for surgery, with the diagnosis of ventral hernia, diastasis recti.  The various methods of treatment have been discussed with the patient and family. After consideration of risks, benefits and other options for treatment, the patient has consented to  Procedure(s) with comments: XI ROBOTIC ASSISTED VENTRAL HERNIA (N/A) - Provider requesting 4 hours / 240 minutes for procedure. as a surgical intervention.  The patient's history has been reviewed, patient examined, no change in status, stable for surgery.  I have reviewed the patient's chart and labs.  Questions were answered to the patient's satisfaction.     Kyle Kennedy
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
## 2021-08-24 NOTE — Transfer of Care (Signed)
Immediate Anesthesia Transfer of Care Note  Patient: Kyle Kennedy  Procedure(s) Performed: XI ROBOTIC ASSISTED VENTRAL HERNIA INSERTION OF MESH  Patient Location: PACU  Anesthesia Type:General  Level of Consciousness: awake  Airway & Oxygen Therapy: Patient Spontanous Breathing and Patient connected to face mask oxygen  Post-op Assessment: Report given to RN and Post -op Vital signs reviewed and stable  Post vital signs: Reviewed and stable  Last Vitals:  Vitals Value Taken Time  BP 120/66 08/24/21 1400  Temp    Pulse 80 08/24/21 1408  Resp 19 08/24/21 1408  SpO2 99 % 08/24/21 1408  Vitals shown include unvalidated device data.  Last Pain:  Vitals:   08/24/21 0813  TempSrc: Temporal  PainSc: 0-No pain         Complications: No notable events documented.

## 2021-08-24 NOTE — Telephone Encounter (Signed)
Please let patient know the below- left message to return call to office-   Medical Clearance received from Dr.Maxwell-hold aspirin 5 days prior to surgery-take Statin day of surgery hold blood pressure medications day of surgery.

## 2021-08-24 NOTE — Anesthesia Preprocedure Evaluation (Signed)
Anesthesia Evaluation  Patient identified by MRN, date of birth, ID band Patient awake    Reviewed: Allergy & Precautions, NPO status , Patient's Chart, lab work & pertinent test results  History of Anesthesia Complications Negative for: history of anesthetic complications  Airway Mallampati: II  TM Distance: >3 FB Neck ROM: Full    Dental  (+) Upper Dentures, Lower Dentures   Pulmonary asthma , sleep apnea and Continuous Positive Airway Pressure Ventilation ,    breath sounds clear to auscultation- rhonchi (-) wheezing      Cardiovascular hypertension, Pt. 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 No date: Anxiety No date: Asthma No date: Blood transfusion No date: Cough No date: 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     Comment:  right No date: Depression No date: Double vision No date: Fatigue No date: GERD (gastroesophageal reflux disease) No date: Gout No date: Hiatal hernia No date: Hyperlipidemia No date: Hypertension No date: Knee pain     Comment:  right No date: Loss of appetite No date: Night sweats No date: Osteoarthritis No date: Peptic stricture of esophagus No date: Shortness of breath     Comment:  with exertion No date: Sleep apnea 1982: Traumatic brain injury     Comment:  optic nerve damage No date: Wears dentures   Reproductive/Obstetrics                              Anesthesia Physical Anesthesia Plan  ASA: 3  Anesthesia Plan: General   Post-op Pain Management:    Induction: Intravenous  PONV Risk Score and Plan: 1 and Ondansetron, Dexamethasone and Midazolam  Airway Management Planned: Oral ETT  Additional Equipment:   Intra-op Plan:   Post-operative Plan: Extubation in OR  Informed Consent: I have reviewed the patients History and Physical, chart, labs and discussed the procedure including the risks, benefits and alternatives for the proposed anesthesia with the patient or authorized representative who has indicated his/her understanding and acceptance.     Dental advisory given  Plan Discussed with: CRNA and Anesthesiologist  Anesthesia Plan Comments:         Anesthesia Quick Evaluation

## 2021-08-24 NOTE — Discharge Instructions (Addendum)
AMBULATORY SURGERY  DISCHARGE INSTRUCTIONS   The drugs that you were given will stay in your system until tomorrow so for the next 24 hours you should not:  Drive an automobile Make any legal decisions Drink any alcoholic beverage   You may resume regular meals tomorrow.  Today it is better to start with liquids and gradually work up to solid foods.  You may eat anything you prefer, but it is better to start with liquids, then soup and crackers, and gradually work up to solid foods.   Please notify your doctor immediately if you have any unusual bleeding, trouble breathing, redness and pain at the surgery site, drainage, fever, or pain not relieved by medication.    Your post-operative visit with Dr.                                       is: Date:                        Time:    Please call to schedule your post-operative visit.  Additional Instructions: Do not remove teal arm band for 4 days

## 2021-08-24 NOTE — Anesthesia Procedure Notes (Signed)
Procedure Name: Intubation Date/Time: 08/24/2021 9:58 AM Performed by: Renee Ramus, CRNA Pre-anesthesia Checklist: Patient identified, Emergency Drugs available, Suction available and Patient being monitored Patient Re-evaluated:Patient Re-evaluated prior to induction Oxygen Delivery Method: Circle system utilized Preoxygenation: Pre-oxygenation with 100% oxygen Induction Type: IV induction Ventilation: Mask ventilation without difficulty Laryngoscope Size: McGraph and 4 Grade View: Grade I Tube type: Oral Tube size: 7.5 mm Number of attempts: 1 Airway Equipment and Method: Stylet and Oral airway Placement Confirmation: ETT inserted through vocal cords under direct vision, positive ETCO2 and breath sounds checked- equal and bilateral Secured at: 21 cm Tube secured with: Tape Dental Injury: Teeth and Oropharynx as per pre-operative assessment

## 2021-08-25 ENCOUNTER — Encounter: Payer: Self-pay | Admitting: Surgery

## 2021-08-27 ENCOUNTER — Telehealth: Payer: Self-pay | Admitting: *Deleted

## 2021-08-27 ENCOUNTER — Other Ambulatory Visit: Payer: Self-pay | Admitting: Surgery

## 2021-08-27 MED ORDER — OXYCODONE HCL 5 MG PO TABS: 5.0000 mg | ORAL_TABLET | ORAL | 0 refills | Status: DC | PRN

## 2021-08-27 NOTE — Telephone Encounter (Signed)
Spoke with Dr Aleen Campi and let him know about the patient. He will send in a refill of Oxycodone to the patient's pharmacy due to the extensive nature of the repair that he did. The prescription cannot be refilled by the patient before Sunday. I spoke with the patient and he is appreciative of this and will try to take less of the Oxycodone after this weekend. He is aware to call with any further questions.

## 2021-08-27 NOTE — Telephone Encounter (Signed)
Patient had surgery on 08/24/21 ventral hernia repair by Dr.Piscoya and wants to know if he can get another refill on oxycodone.

## 2021-08-27 NOTE — Telephone Encounter (Signed)
Spoke with the patient about his request for a refill of his Oxycodone. He is worried that he will run out this weekend. He has been taking as prescribed 1 tablet every 4 hours. He has also been taking the 800mg  Ibuprofen and the 1000mg  Tylenol as prescribed and has been rotating theses every 4 hours. I let him know that  Dr may not refill his Oxycodone since it is so soon, but that I would ask him about this. I reviewed pain control with the patient and suggested that he use ice to the area 15-20 minutes at a time several times a day being sure he has something between him and the ice pack. He is amendable to this and will try to take a little less of the Oxycodone.

## 2021-08-30 ENCOUNTER — Other Ambulatory Visit: Payer: Self-pay

## 2021-08-30 ENCOUNTER — Ambulatory Visit (INDEPENDENT_AMBULATORY_CARE_PROVIDER_SITE_OTHER): Payer: Medicare Other | Admitting: Student

## 2021-08-30 ENCOUNTER — Encounter: Payer: Self-pay | Admitting: Student

## 2021-08-30 DIAGNOSIS — I10 Essential (primary) hypertension: Secondary | ICD-10-CM

## 2021-08-30 DIAGNOSIS — K432 Incisional hernia without obstruction or gangrene: Secondary | ICD-10-CM

## 2021-08-30 NOTE — Progress Notes (Signed)
SUBJECTIVE:   CHIEF COMPLAINT / HPI:   HTN: Currently the patient is on Amlodipine 10 mg and Lisinopril 20 mg. He is well-controlled today at 128/80. He brought in pressures from home.  These pressures are slightly more elevated with systolics between the 130s and 150s and diastolics ranging in the 80s to 90s.  He uses a cuff on the arm and uses a large cuff that he feels is appropriate for the size of his arm.  Currently he denies any headaches or change in vision.  Ventral Hernia repair performed on 08/24/21: He has a followed up with Dr. Aleen Campi on 09/08/21. Pain control regimen includes: Ibuprofen 800 mg, Tylenol 1000mg  in rotation, and oxycodone as prescribed by Dr. .  He has not had any complications with his abdominal binder and pain is well controlled with his current regimen.  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  Constitutional:      Appearance: Normal appearance.     Comments: Abdominal binder in place  HENT:     Mouth/Throat:     Mouth: Mucous membranes are moist.  Cardiovascular:     Rate and Rhythm: Normal rate and  regular rhythm.  Pulmonary:     Effort: Pulmonary effort is normal.     Breath sounds: Normal breath sounds.  Abdominal:     General: There is no distension.     Comments: No obvious TTP, left abdominal binder in place.   Skin:    General: Skin is warm and dry.  Neurological:     Mental Status: He is alert.  Psychiatric:        Mood and Affect: Mood normal.        Behavior: Behavior normal.     ASSESSMENT/PLAN:   Hypertension We will continue with his amlodipine 10 mg and lisinopril 20 mg at this time.  I have asked the patient to return in 3 days for a nursing visit with his cuff and machine.  We will compare the findings from our cuff and his cuff given the stark difference in his blood pressure when comparing the visit today to his home ranges.  After this, we can evaluate for further change in blood pressure management.  Ventral incisional hernia Patient had repair performed on 08/24/2021.  He denies any complications with this and has been wearing his abdominal binder and taking his pain control regimen as instructed.  Vital signs are stable.  He will see me again on 11/17 and will see his surgeon on  11/23.     [name], MD [facility]

## 2021-08-30 NOTE — Assessment & Plan Note (Signed)
We will continue with his amlodipine 10 mg and lisinopril 20 mg at this time.  I have asked the patient to return in 3 days for a nursing visit with his cuff and machine.  We will compare the findings from our cuff and his cuff given the stark difference in his blood pressure when comparing the visit today to his home ranges.  After this, we can evaluate for further change in blood pressure management.

## 2021-08-30 NOTE — Assessment & Plan Note (Signed)
Patient had repair performed on 08/24/2021.  He denies any complications with this and has been wearing his abdominal binder and taking his pain control regimen as instructed.  Vital signs are stable.  He will see me again on 11/17 and will see his surgeon on 11/23.

## 2021-08-30 NOTE — Patient Instructions (Signed)
It was a pleasure to see you today! Thank you for choosing [facility] for your primary care. Kyle Kennedy was seen for high blood pressure follow up.   Our plans for today were: Continue with current blood pressure medications Your blood pressure looks good today!  Because you have higher home readings, we will have you come in and compare cuffs  Bring your cuff in for a nurses visit and we will check your blood pressure with your home cuff and the cuff we use here.  Continue to follow up with your surgeon for post op care of your hernia repair   You should return to our clinic to see [place] in a few days for a nursing visit.   Best,  Dr. Jena Gauss

## 2021-08-31 ENCOUNTER — Other Ambulatory Visit: Payer: Self-pay | Admitting: Student

## 2021-09-02 ENCOUNTER — Ambulatory Visit: Payer: Medicare Other

## 2021-09-02 ENCOUNTER — Other Ambulatory Visit: Payer: Self-pay | Admitting: Family Medicine

## 2021-09-02 ENCOUNTER — Other Ambulatory Visit: Payer: Self-pay

## 2021-09-02 DIAGNOSIS — I10 Essential (primary) hypertension: Secondary | ICD-10-CM

## 2021-09-02 NOTE — Progress Notes (Signed)
Patients in clinic with his wife.  Patient would like his BP checked manually along with his personal home cuff.  Last BP in office on 11/14-128/80.  Today's BP manually 138/80 checked in left arm with large cuff.  Today's BP with patients home cuff 148/79 checked in right arm.   Patient denies any symptoms at this time.  PCP has been updated.

## 2021-09-08 ENCOUNTER — Other Ambulatory Visit: Payer: Self-pay

## 2021-09-08 ENCOUNTER — Ambulatory Visit (INDEPENDENT_AMBULATORY_CARE_PROVIDER_SITE_OTHER): Payer: Medicare Other | Admitting: Surgery

## 2021-09-08 ENCOUNTER — Encounter: Payer: Self-pay | Admitting: Surgery

## 2021-09-08 VITALS — BP 135/87 | HR 69 | Temp 98.1°F | Ht 67.0 in | Wt 258.6 lb

## 2021-09-08 DIAGNOSIS — Z09 Encounter for follow-up examination after completed treatment for conditions other than malignant neoplasm: Secondary | ICD-10-CM

## 2021-09-08 DIAGNOSIS — K432 Incisional hernia without obstruction or gangrene: Secondary | ICD-10-CM

## 2021-09-08 DIAGNOSIS — M6208 Separation of muscle (nontraumatic), other site: Secondary | ICD-10-CM

## 2021-09-08 NOTE — Patient Instructions (Signed)

## 2021-09-08 NOTE — Progress Notes (Signed)
09/08/2021  HPI: Kyle Kennedy is a 50 y.o. male s/p robotic assisted incisional ventral hernia repair with plication of diastasis recti on 08/24/21.  Patient presents for follow up.  He reports he's doing well, with still some residual soreness.  He takes Oxycodone at night so he can sleep better as he rolls a lot in bed, and otherwise takes tylenol or ibuprofen during the day as needed.  Denies any worsening pain or new bulging areas.  Continues to wear the abdominal binder.  Vital signs: BP 135/87   Pulse 69   Temp 98.1 F (36.7 C) (Oral)   Ht 5\' 7"  (1.702 m)   Wt 258 lb 9.6 oz (117.3 kg)   SpO2 95%   BMI 40.50 kg/m    Physical Exam: Constitutional:  No acute distress Abdomen:  soft, non-distended, non-tender to palpation.  No evidence of hernia recurrence, and rectus muscles are more approximated.  Left sided incisions healing well.  Assessment/Plan: This is a 50 y.o. male s/p robotic assisted incisional hernia repair with plication of diastasis recti.  --Patient is recovering well so far and discussed with him that residual soreness at this point in time is normal as he's only two weeks from surgery.  This should continue to improve. --Discussed activity restrictions again with him and he has 4 weeks of no heavy lifting/pushing, and 6 weeks before doing any true straining exercise such as sit ups, push ups.  He asked specifically about riding lawnmower and leaf raking machine, which does not need heavy lifting and that's ok to do. --Follow up as needed.   44, MD [facility]

## 2021-09-10 DIAGNOSIS — G4733 Obstructive sleep apnea (adult) (pediatric): Secondary | ICD-10-CM | POA: Diagnosis not present

## 2021-09-16 NOTE — Discharge Summary (Signed)
Patient ID: SHAMAL STRACENER MRN: [id number] DOB/AGE: 05/29/1971 50 y.o.  Admit date: 08/24/2021 Discharge date: 08/24/21   Discharge Diagnoses:  Active Problems:   Ventral incisional hernia   Procedures:  Robotic assisted incisional hernia repair with mesh  Hospital Course: Patient underwent the above procedure on 08/24/21.  In PACU, the patient was feeling well and wished to be discharged instead of staying overnight.  His exam was reassuring and his vital signs were stable and normal.  He was discharged from PACU.  Consults: None  Disposition: Discharge disposition: 01-Home or Self Care       Discharge Instructions     Call MD for:  difficulty breathing, headache or visual disturbances   Complete by: As directed    Call MD for:  persistant nausea and vomiting   Complete by: As directed    Call MD for:  redness, tenderness, or signs of infection (pain, swelling, redness, odor or green/yellow discharge around incision site)   Complete by: As directed    Call MD for:  severe uncontrolled pain   Complete by: As directed    Call MD for:  temperature >100.4   Complete by: As directed    Diet - low sodium heart healthy   Complete by: As directed    Discharge instructions   Complete by: As directed    1.  Patient may shower, but do not scrub wounds heavily and dab dry only. 2.  Do not submerge wounds in pool/tub until fully healed. 3.  Do not apply ointments or hydrogen peroxide to the wounds. 4.  May apply ice packs to the wounds for comfort. 5.  Please wear abdominal binder at all times for the next 4 weeks.  May remove binder to shower. 6.  May resume your Aspirin on 08/26/21.   Driving Restrictions   Complete by: As directed    Do not drive while taking narcotics for pain control.  Prior to driving, make sure you are able to rotate right and left to look at blindspots without significant pain or discomfort.   Increase activity slowly   Complete by: As directed     Lifting restrictions   Complete by: As directed    No heavy lifting or pushing of more than 10-15 lbs for 4 weeks.   No dressing needed   Complete by: As directed       Allergies as of 08/24/2021   No Known Allergies      Medication List     TAKE these medications    acetaminophen 500 MG tablet Commonly known as: TYLENOL Take 2 tablets (1,000 mg total) by mouth every 6 (six) hours as needed for mild pain.   allopurinol 100 MG tablet Commonly known as: ZYLOPRIM TAKE 1 TABLET BY MOUTH DAILY   amLODipine 10 MG tablet Commonly known as: NORVASC TAKE 1 TABLET(10 MG) BY MOUTH DAILY   aspirin 81 MG tablet Take 1 tablet (81 mg total) by mouth daily.   buPROPion 150 MG 24 hr tablet Commonly known as: WELLBUTRIN XL TAKE 1 TABLET(150 MG) BY MOUTH DAILY   esomeprazole 40 MG capsule Commonly known as: NEXIUM TAKE ONE CAPSULE BY MOUTH TWICE DAILY   fluticasone 50 MCG/ACT nasal spray Commonly known as: FLONASE SHAKE LIQUID AND USE 1 SPRAY IN EACH NOSTRIL DAILY What changed: See the new instructions.   gabapentin 600 MG tablet Commonly known as: NEURONTIN Take 1.5 tablets (900 mg total) by mouth daily as needed. What changed:  how much to  take when to take this   ibuprofen 600 MG tablet Commonly known as: ADVIL Take 1 tablet (600 mg total) by mouth every 8 (eight) hours as needed. What changed: Another medication with the same name was added. Make sure you understand how and when to take each.   ibuprofen 800 MG tablet Commonly known as: ADVIL Take 1 tablet (800 mg total) by mouth every 8 (eight) hours as needed for moderate pain. What changed: You were already taking a medication with the same name, and this prescription was added. Make sure you understand how and when to take each.   levocetirizine 5 MG tablet Commonly known as: XYZAL TAKE 1 TABLET(5 MG) BY MOUTH EVERY EVENING   MULTI-VITAMIN DAILY PO Take by mouth.   Omega-3 300 MG Caps Take 300 mg by mouth  daily.   OVER THE COUNTER MEDICATION Take 1 tablet by mouth daily. Visiclear   simvastatin 40 MG tablet Commonly known as: ZOCOR Take one daily.   triamcinolone 0.025 % ointment Commonly known as: KENALOG Apply 1 application topically 2 (two) times daily.   venlafaxine 75 MG tablet Commonly known as: EFFEXOR Take 3 tablets (225mg ) by mouth daily.               Discharge Care Instructions  (From admission, onward)           Start     Ordered   08/24/21 0000  No dressing needed        08/24/21 1540            Follow-up Information     13/08/22, MD Follow up on 09/08/2021.   Specialty: General Surgery Why: Post op appointment is at 11:30 AM Contact information: [address] [place] [phone]

## 2021-09-24 ENCOUNTER — Encounter: Payer: Self-pay | Admitting: Student

## 2021-10-09 ENCOUNTER — Other Ambulatory Visit: Payer: Self-pay | Admitting: Student

## 2021-11-05 ENCOUNTER — Other Ambulatory Visit: Payer: Self-pay | Admitting: Surgery

## 2021-12-09 DIAGNOSIS — G4733 Obstructive sleep apnea (adult) (pediatric): Secondary | ICD-10-CM | POA: Diagnosis not present

## 2021-12-13 ENCOUNTER — Encounter: Payer: Self-pay | Admitting: Student

## 2021-12-21 ENCOUNTER — Ambulatory Visit (INDEPENDENT_AMBULATORY_CARE_PROVIDER_SITE_OTHER): Payer: Medicare Other

## 2021-12-21 ENCOUNTER — Other Ambulatory Visit: Payer: Self-pay

## 2021-12-21 VITALS — BP 146/82 | HR 75 | Ht 67.0 in | Wt 262.0 lb

## 2021-12-21 DIAGNOSIS — Z0000 Encounter for general adult medical examination without abnormal findings: Secondary | ICD-10-CM

## 2021-12-21 NOTE — Patient Instructions (Addendum)
You spoke to Kyle Kennedy, [place] for your annual wellness visit. ? ?We discussed goals: ? ? Goals   ? ?  Weight (lb) < 200 lb (90.7 kg)   ? ?  ? ?We also discussed recommended health maintenance. As discussed, you are due for: ?Health Maintenance  ?Topic Date Due  ? Zoster Vaccines- Shingrix (2 of 2) 10/09/2020  ? TETANUS/TDAP  08/08/2023  ? COLONOSCOPY (Pts 45-34yrs Insurance coverage will need to be confirmed)  12/22/2029  ? INFLUENZA VACCINE  Completed  ? COVID-19 Vaccine  Completed  ? Hepatitis C Screening  Completed  ? HIV Screening  Completed  ? HPV VACCINES  Aged Out  ? ?PCP apt scheduled for April 26th. ?#2 shingles vaccine due- you can get this at your local pharmacy.  ?Start cutting back on [facility] as discussed!  ?This will help jump start your weight loss.  ? ?Preventive Care 54-56 Years Old, Male ?Preventive care refers to lifestyle choices and visits with your health care provider that can promote health and wellness. Preventive care visits are also called wellness exams. ?What can I expect for my preventive care visit? ?Counseling ?During your preventive care visit, your health care provider may ask about your: ?Medical history, including: ?Past medical problems. ?Family medical history. ?Current health, including: ?Emotional well-being. ?Home life and relationship well-being. ?Sexual activity. ?Lifestyle, including: ?Alcohol, nicotine or tobacco, and drug use. ?Access to firearms. ?Diet, exercise, and sleep habits. ?Safety issues such as seatbelt and bike helmet use. ?Sunscreen use. ?Work and work Statistician. ?Physical exam ?Your health care provider will check your: ?Height and weight. These may be used to calculate your BMI (body mass index). BMI is a measurement that tells if you are at a healthy weight. ?Waist circumference. This measures the distance around your waistline. This measurement also tells if you are at a healthy weight and may help predict your risk of certain diseases, such as  type 2 diabetes and high blood pressure. ?Heart rate and blood pressure. ?Body temperature. ?Skin for abnormal spots. ?What immunizations do I need? ?Vaccines are usually given at various ages, according to a schedule. Your health care provider will recommend vaccines for you based on your age, medical history, and lifestyle or other factors, such as travel or where you work. ?What tests do I need? ?Screening ?Your health care provider may recommend screening tests for certain conditions. This may include: ?Lipid and cholesterol levels. ?Diabetes screening. This is done by checking your blood sugar (glucose) after you have not eaten for a while (fasting). ?Hepatitis B test. ?Hepatitis C test. ?HIV (human immunodeficiency virus) test. ?STI (sexually transmitted infection) testing, if you are at risk. ?Lung cancer screening. ?Prostate cancer screening. ?Colorectal cancer screening. ?Talk with your health care provider about your test results, treatment options, and if necessary, the need for more tests. ?Follow these instructions at home: ?Eating and drinking ? ?Eat a diet that includes fresh fruits and vegetables, whole grains, lean protein, and low-fat dairy products. ?Take vitamin and mineral supplements as recommended by your health care provider. ?Do not drink alcohol if your health care provider tells you not to drink. ?If you drink alcohol: ?Limit how much you have to 0-2 drinks a day. ?Know how much alcohol is in your drink. In the [place] one drink equals one 12 oz bottle of beer (355 mL), one 5 oz glass of wine (148 mL), or one 1? oz glass of hard liquor (44 mL). ?Lifestyle ?Brush your teeth every morning and  night with fluoride toothpaste. Floss one time each day. ?Exercise for at least 30 minutes 5 or more days each week. ?Do not use any products that contain nicotine or tobacco. These products include cigarettes, chewing tobacco, and vaping devices, such as e-cigarettes. If you need help quitting, ask your  health care provider. ?Do not use drugs. ?If you are sexually active, practice safe sex. Use a condom or other form of protection to prevent STIs. ?Take aspirin only as told by your health care provider. Make sure that you understand how much to take and what form to take. Work with your health care provider to find out whether it is safe and beneficial for you to take aspirin daily. ?Find healthy ways to manage stress, such as: ?Meditation, yoga, or listening to music. ?Journaling. ?Talking to a trusted person. ?Spending time with friends and family. ?Minimize exposure to UV radiation to reduce your risk of skin cancer. ?Safety ?Always wear your seat belt while driving or riding in a vehicle. ?Do not drive: ?If you have been drinking alcohol. Do not ride with someone who has been drinking. ?When you are tired or distracted. ?While texting. ?If you have been using any mind-altering substances or drugs. ?Wear a helmet and other protective equipment during sports activities. ?If you have firearms in your house, make sure you follow all gun safety procedures. ?What's next? ?Go to your health care provider once a year for an annual wellness visit. ?Ask your health care provider how often you should have your eyes and teeth checked. ?Stay up to date on all vaccines. ?This information is not intended to replace advice given to you by your health care provider. Make sure you discuss any questions you have with your health care provider. ?Document Revised: 03/31/2021 Document Reviewed: 03/31/2021 ?Elsevier Patient Education ? 2022 [place]. ? ? ?Our clinic's number is [phone]. Please call with questions or concerns about what we discussed today.  ?
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
## 2021-12-22 NOTE — Progress Notes (Signed)
I have reviewed this visit and agree with the documentation.   

## 2021-12-28 ENCOUNTER — Other Ambulatory Visit: Payer: Self-pay

## 2021-12-28 MED ORDER — GABAPENTIN 600 MG PO TABS
900.0000 mg | ORAL_TABLET | Freq: Every day | ORAL | 3 refills | Status: DC | PRN
Start: 2021-12-28 — End: 2022-08-26

## 2021-12-30 ENCOUNTER — Other Ambulatory Visit: Payer: Self-pay | Admitting: Family Medicine

## 2022-01-18 ENCOUNTER — Other Ambulatory Visit: Payer: Self-pay | Admitting: Student

## 2022-01-18 DIAGNOSIS — I10 Essential (primary) hypertension: Secondary | ICD-10-CM

## 2022-02-05 ENCOUNTER — Other Ambulatory Visit: Payer: Self-pay | Admitting: Student

## 2022-02-06 ENCOUNTER — Other Ambulatory Visit: Payer: Self-pay | Admitting: Student

## 2022-02-06 DIAGNOSIS — I10 Essential (primary) hypertension: Secondary | ICD-10-CM

## 2022-02-08 NOTE — Assessment & Plan Note (Addendum)
Continue with Amlodipine 10 and Lisinopril 20 mg. BP today at goal.  ?
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
## 2022-02-08 NOTE — Assessment & Plan Note (Addendum)
Will ensure patient uses CPAP at home on next visit.  ?

## 2022-02-08 NOTE — Assessment & Plan Note (Signed)
Nexium 40 mg BID continued, stable  ?

## 2022-02-09 ENCOUNTER — Ambulatory Visit (INDEPENDENT_AMBULATORY_CARE_PROVIDER_SITE_OTHER): Payer: Medicare Other | Admitting: Student

## 2022-02-09 ENCOUNTER — Other Ambulatory Visit: Payer: Self-pay

## 2022-02-09 ENCOUNTER — Encounter: Payer: Self-pay | Admitting: Student

## 2022-02-09 VITALS — BP 139/88 | HR 72 | Wt 260.6 lb

## 2022-02-09 DIAGNOSIS — S4991XA Unspecified injury of right shoulder and upper arm, initial encounter: Secondary | ICD-10-CM | POA: Diagnosis not present

## 2022-02-09 DIAGNOSIS — G4733 Obstructive sleep apnea (adult) (pediatric): Secondary | ICD-10-CM

## 2022-02-09 DIAGNOSIS — J3089 Other allergic rhinitis: Secondary | ICD-10-CM

## 2022-02-09 DIAGNOSIS — I10 Essential (primary) hypertension: Secondary | ICD-10-CM | POA: Diagnosis not present

## 2022-02-09 DIAGNOSIS — K219 Gastro-esophageal reflux disease without esophagitis: Secondary | ICD-10-CM

## 2022-02-09 DIAGNOSIS — S4991XD Unspecified injury of right shoulder and upper arm, subsequent encounter: Secondary | ICD-10-CM | POA: Insufficient documentation

## 2022-02-09 MED ORDER — NAPROXEN 500 MG PO TABS: 500.0000 mg | ORAL_TABLET | Freq: Two times a day (BID) | ORAL | 0 refills | Status: AC | PRN

## 2022-02-09 MED ORDER — LORATADINE 10 MG PO TBDP: 10.0000 mg | ORAL_TABLET | Freq: Every day | ORAL | 0 refills | Status: AC

## 2022-02-09 NOTE — Assessment & Plan Note (Signed)
Concern for rotator cuff injury, likely strain, less likely tear. No evidence of fracture or dislocation. No evidence of adhesive capsulitis with FROM. Naprosyn ordered for pain control and shoulder exercises provided and exhibited to patient in clinic. If pain persists over the next month, he should contact me for PT versus imaging if there is high suspicion for tear at that time.  ?

## 2022-02-09 NOTE — Patient Instructions (Signed)
It was great to see you today! Thank you for choosing [facility] for your primary care. Kyle Kennedy was seen for right shoulder pain.  ? ?Today we addressed: ?Continue with Naprosyn and Tylenol alternating  ? ? ?No orders of the defined types were placed in this encounter. ? ?No orders of the defined types were placed in this encounter. ? ? ?We are checking some labs today. If they are abnormal, I will call you. If they are normal, I will send you a MyChart message (if it is active) or a letter in the mail. If you do not hear about your labs in the next 2 weeks, please call the office. ? ?You should return to our clinic Return in about 1 month (around 03/11/2022).. ? ?I recommend that you always bring your medications to each appointment as this makes it easy to ensure you are on the correct medications and helps [place] not miss refills when you need them. ? ?Please arrive 15 minutes before your appointment to ensure smooth check in process.  We appreciate your efforts in making this happen. ? ?Take care and seek immediate care sooner if you develop any concerns.  ? ?Thank you for allowing me to participate in your care, ?Arlenne Kimbley  ? ? ?

## 2022-02-10 ENCOUNTER — Other Ambulatory Visit: Payer: Self-pay

## 2022-02-10 DIAGNOSIS — J3089 Other allergic rhinitis: Secondary | ICD-10-CM

## 2022-02-21 ENCOUNTER — Other Ambulatory Visit: Payer: Self-pay | Admitting: Student

## 2022-02-21 DIAGNOSIS — S4991XA Unspecified injury of right shoulder and upper arm, initial encounter: Secondary | ICD-10-CM

## 2022-03-04 ENCOUNTER — Encounter: Payer: Self-pay | Admitting: Student

## 2022-03-18 ENCOUNTER — Encounter: Payer: Self-pay | Admitting: Student

## 2022-03-18 DIAGNOSIS — M25511 Pain in right shoulder: Secondary | ICD-10-CM

## 2022-03-22 ENCOUNTER — Encounter: Payer: Self-pay | Admitting: *Deleted

## 2022-03-24 ENCOUNTER — Ambulatory Visit (HOSPITAL_COMMUNITY)
Admission: RE | Admit: 2022-03-24 | Discharge: 2022-03-24 | Disposition: A | Discharge status: Home / Self Care | Payer: Medicare Other | Source: Ambulatory Visit | Attending: Family Medicine | Admitting: Family Medicine

## 2022-03-24 DIAGNOSIS — M25511 Pain in right shoulder: Secondary | ICD-10-CM | POA: Diagnosis not present

## 2022-03-24 DIAGNOSIS — M25711 Osteophyte, right shoulder: Secondary | ICD-10-CM | POA: Diagnosis not present

## 2022-03-31 ENCOUNTER — Other Ambulatory Visit: Payer: Self-pay | Admitting: Student

## 2022-03-31 DIAGNOSIS — F3341 Major depressive disorder, recurrent, in partial remission: Secondary | ICD-10-CM

## 2022-04-01 ENCOUNTER — Ambulatory Visit: Payer: Medicare Other | Admitting: Student

## 2022-04-04 ENCOUNTER — Ambulatory Visit (INDEPENDENT_AMBULATORY_CARE_PROVIDER_SITE_OTHER): Payer: Medicare Other | Admitting: Student

## 2022-04-04 ENCOUNTER — Encounter: Payer: Self-pay | Admitting: Student

## 2022-04-04 VITALS — BP 134/90 | HR 65 | Ht 67.0 in | Wt 256.2 lb

## 2022-04-04 DIAGNOSIS — R7303 Prediabetes: Secondary | ICD-10-CM | POA: Diagnosis not present

## 2022-04-04 DIAGNOSIS — S4991XD Unspecified injury of right shoulder and upper arm, subsequent encounter: Secondary | ICD-10-CM

## 2022-04-04 DIAGNOSIS — F3341 Major depressive disorder, recurrent, in partial remission: Secondary | ICD-10-CM

## 2022-04-04 DIAGNOSIS — G4733 Obstructive sleep apnea (adult) (pediatric): Secondary | ICD-10-CM

## 2022-04-04 DIAGNOSIS — E785 Hyperlipidemia, unspecified: Secondary | ICD-10-CM

## 2022-04-04 DIAGNOSIS — I10 Essential (primary) hypertension: Secondary | ICD-10-CM

## 2022-04-04 LAB — POCT GLYCOSYLATED HEMOGLOBIN (HGB A1C): Hemoglobin A1C: 5.8 % — AB (ref 4.0–5.6)

## 2022-04-04 NOTE — Assessment & Plan Note (Signed)
Continue using CPAP

## 2022-04-04 NOTE — Assessment & Plan Note (Signed)
Stable on Wellbutrin and Effexor.

## 2022-04-04 NOTE — Assessment & Plan Note (Signed)
As indicated by A1c 5.8.  Continue with nutritionist as patient is interested in weight loss.  Patient would be a good candidate for [facility] but this is not covered by insurance for weight loss.  Patient would not be a good candidate for Topamax as it can cause some memory difficulties.  He is also not a good candidate for a stimulant medication.  We will continue with the nutritionist for now and reassess, discussed to continue with a food diary and to eat fruits for bedtime snack for now in addition to drinking water.

## 2022-04-04 NOTE — Assessment & Plan Note (Signed)
Patient with right shoulder injury concerning for rotator cuff strain versus tear.  Patient likely has a component of adhesive capsulitis as well.  X-rays unremarkable.  Continue with sports medicine referral for evaluation via ultrasound and/or steroid injection.
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
## 2022-04-04 NOTE — Assessment & Plan Note (Signed)
Lipid panel updated, last lipid panel 06/2021.  Stable, continue current medication regimen.

## 2022-04-04 NOTE — Patient Instructions (Addendum)
It was great to see you today! Thank you for choosing [facility] for your primary care. Kyle Kennedy was seen for follow up.  Today we addressed: Continuing with a referral to sports medicine to possibly get an ultrasound or steroid injection  We will check an A1C today as well to evaluate for diabetes    Orders Placed This Encounter  Procedures   Ambulatory referral to Sports Medicine    Referral Priority:   Routine    Referral Type:   Consultation    Number of Visits Requested:   1   No orders of the defined types were placed in this encounter.   If you haven't already, sign up for My Chart to have easy access to your labs results, and communication with your primary care physician.  We are checking some labs today. If they are abnormal, I will call you. If they are normal, I will send you a MyChart message (if it is active) or a letter in the mail. If you do not hear about your labs in the next 2 weeks, please call the office.   You should return to our clinic Return in about 1 month (around 05/04/2022) for shoulder and nutrition f/u .  I recommend that you always bring your medications to each appointment as this makes it easy to ensure you are on the correct medications and helps [place] not miss refills when you need them.  Please arrive 15 minutes before your appointment to ensure smooth check in process.  We appreciate your efforts in making this happen.  Please call the clinic at [phone] if your symptoms worsen or you have any concerns.  Thank you for allowing me to participate in your care, [name] [employer]

## 2022-04-04 NOTE — Assessment & Plan Note (Signed)
Stable on current regimen as above.

## 2022-04-05 ENCOUNTER — Encounter: Payer: Self-pay | Admitting: Student

## 2022-04-06 ENCOUNTER — Ambulatory Visit: Payer: Self-pay

## 2022-04-06 ENCOUNTER — Encounter: Payer: Self-pay | Admitting: Family Medicine

## 2022-04-06 ENCOUNTER — Ambulatory Visit (INDEPENDENT_AMBULATORY_CARE_PROVIDER_SITE_OTHER): Payer: Medicare Other | Admitting: Family Medicine

## 2022-04-06 ENCOUNTER — Other Ambulatory Visit: Payer: Self-pay | Admitting: Student

## 2022-04-06 VITALS — BP 144/84 | Ht 69.0 in | Wt 255.0 lb

## 2022-04-06 DIAGNOSIS — M25511 Pain in right shoulder: Secondary | ICD-10-CM | POA: Diagnosis not present

## 2022-04-06 DIAGNOSIS — E8881 Metabolic syndrome: Secondary | ICD-10-CM

## 2022-04-06 MED ORDER — WEGOVY 0.25 MG/0.5ML ~~LOC~~ SOAJ: 0.2500 mg | SUBCUTANEOUS | 0 refills | Status: DC

## 2022-04-06 NOTE — Progress Notes (Signed)
PCP and referral requested by: Kyle Martinez, MD  Subjective:   HPI: Patient is a 51 y.o. male here for right shoulder pain.  Patient reports about 1 month ago (4/26) while getting the mail for his brother he was coming up the driveway when he fell and landed directly on his right shoulder. No swelling/bruising. Since then has had difficulty lifting arm above shoulder level, reaching. No prior injuries to this shoulder. He is right handed.  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     allopurinol (ZYLOPRIM) 100 MG tablet TAKE 1 TABLET BY MOUTH DAILY 90 tablet 3   amLODipine (NORVASC) 10 MG tablet TAKE 1 TABLET(10 MG) BY MOUTH DAILY 90 tablet 1   buPROPion (WELLBUTRIN XL) 150 MG 24 hr tablet TAKE 1 TABLET(150 MG) BY MOUTH DAILY 90 tablet 1   esomeprazole (NEXIUM) 40 MG capsule TAKE 1 CAPSULE BY MOUTH TWICE DAILY. 180 capsule 3   fluticasone (FLONASE) 50 MCG/ACT nasal spray SHAKE LIQUID AND USE 1 SPRAY IN EACH NOSTRIL DAILY (Patient taking differently: Place 2 sprays into both nostrils daily as needed for allergies.) 9.9 g 2   gabapentin (NEURONTIN) 600 MG tablet Take 1.5 tablets (900 mg total) by mouth daily as needed. 90 tablet 3   levocetirizine  (XYZAL) 5 MG tablet TAKE 1 TABLET(5 MG) BY MOUTH EVERY EVENING 90 tablet 3   lisinopril (ZESTRIL) 20 MG tablet TAKE 1 TABLET(20 MG) BY MOUTH AT BEDTIME 90 tablet 1   loratadine (CLARITIN REDITABS) 10 MG dissolvable tablet Take 1 tablet (10 mg total) by mouth daily. As needed for allergy symptoms 120 tablet 0   Multiple Vitamin (MULTI-VITAMIN DAILY PO) Take by mouth.     naproxen (NAPROSYN) 500 MG tablet Take 1 tablet (500 mg total) by mouth 2 (two) times daily as needed. 30 tablet 0   Omega-3 300 MG CAPS Take 300 mg by mouth daily.     OVER THE COUNTER MEDICATION Take 1 tablet by mouth daily. Visiclear (Patient not taking: Reported on 12/21/2021)     simvastatin (ZOCOR) 40 MG tablet TAKE 1 TABLET BY MOUTH DAILY 90 tablet 2   triamcinolone (KENALOG) 0.025 % ointment Apply 1 application topically 2 (two) times daily. 30 g 0   venlafaxine (EFFEXOR) 75 MG tablet Take 3 tablets (225mg ) by mouth daily. 180 tablet 6   No current facility-administered medications on file prior to visit.    Past Surgical History:  Procedure Laterality Date  APPENDECTOMY     CHOLECYSTECTOMY     ESOPHAGEAL DILATION     ESOPHAGOGASTRODUODENOSCOPY (EGD) WITH PROPOFOL N/A 09/02/2018   Procedure: ESOPHAGOGASTRODUODENOSCOPY (EGD) WITH PROPOFOL;  Surgeon: Lynann Bologna, MD;  Location: [facility];  Service: Endoscopy;  Laterality: N/A;   EYE SURGERY Left    FOREIGN BODY REMOVAL  09/02/2018   Procedure: FOREIGN BODY REMOVAL;  Surgeon: Lynann Bologna, MD;  Location: [facility];  Service: Endoscopy;;   HAMMER TOE SURGERY Left 12/24/2015   Procedure: LEFT FOURTH AND FIFTH HAMMER TOE CORRECTION AND FLEXOR TENDON RELEASES ;  Surgeon: Toni Arthurs, MD;  Location: [facility];  Service: Orthopedics;  Laterality: Left;   HERNIA REPAIR     inguinal   INSERTION OF MESH  08/24/2021   Procedure: INSERTION OF MESH;  Surgeon: Henrene Dodge, MD;  Location: [facility];  Service: General;;   JOINT REPLACEMENT     REPLACEMENT  TOTAL KNEE  10/2013   right knee   SPLENECTOMY, TOTAL  1982   TOE SURGERY     left   UPPER GASTROINTESTINAL ENDOSCOPY     XI ROBOTIC ASSISTED VENTRAL HERNIA N/A 08/24/2021   Procedure: XI ROBOTIC ASSISTED VENTRAL HERNIA;  Surgeon: Henrene Dodge, MD;  Location: [facility];  Service: General;  Laterality: N/A;  Provider requesting 4 hours / 240 minutes for procedure.    No Known Allergies  BP (!) 144/84   Ht 5\' 9"  (1.753 m)   Wt 255 lb (115.7 kg)   BMI 37.66 kg/m       No data to display              No data to display              Objective:  Physical Exam:  Gen: NAD, comfortable in exam room  Right shoulder: No swelling, ecchymoses.  No gross deformity. No TTP AC joint, biceps tendon Full passive ROM.  Active ROM limited to 90 degrees flexion and abduction.  IR 90, ER 70, equal to left shoulder. Negative Hawkins, Neers. Negative Yergasons. Strength 3/5 with empty can, 5-/5 resisted external rotation.  5/5 internal rotation.  Negative apprehension. NV intact distally.  Complete MSK u/s right shoulder: Biceps tendon: intact with mod tenosynovitis Pec major tendon: intact Subscapularis: intact AC joint: mild arthropathy without effusion Infraspinatus: visible tendon intact but more hypoechoic than expected consistent with strain. Supraspinatus: full thickness tear with retraction anterior supraspinatus.  Posterior portion of tendon appears intact. Posterior glenohumeral joint:  visible posterior labrum appears normal.  No paralabral cyst, notable effusion.  Impression: Full thickness anterior supraspinatus tear with retraction.  Infraspinatus strain.   Assessment & Plan:  1. Right shoulder injury - full thickness tear of supraspinatus with retraction.  About 6-7 weeks out.  Will go ahead with MRI to assess health of tendon, level of retraction to see if this is amenable to repair.  Radiographs negative for fracture.  Icing, aleve, tylenol in meantime.

## 2022-04-06 NOTE — Progress Notes (Signed)
IBBCWU ordered, await insurance auth.

## 2022-04-06 NOTE — Patient Instructions (Signed)
We will go ahead with an MRI of your shoulder to see how far back the muscle has retracted and how reparable this is. Limit reaching activities in the meantime. Icing 15 minutes at a time 3-4 times a day. Aleve, tylenol if needed for pain. I will call you with the results and next steps.

## 2022-04-11 ENCOUNTER — Other Ambulatory Visit: Payer: Self-pay

## 2022-04-11 ENCOUNTER — Ambulatory Visit
Admission: RE | Admit: 2022-04-11 | Discharge: 2022-04-11 | Disposition: A | Discharge status: Home / Self Care | Payer: Medicare Other | Source: Ambulatory Visit | Attending: Family Medicine | Admitting: Family Medicine

## 2022-04-11 DIAGNOSIS — M25511 Pain in right shoulder: Secondary | ICD-10-CM | POA: Diagnosis not present

## 2022-04-11 DIAGNOSIS — I10 Essential (primary) hypertension: Secondary | ICD-10-CM

## 2022-04-11 MED ORDER — LISINOPRIL 20 MG PO TABS: ORAL_TABLET | ORAL | 1 refills | Status: DC

## 2022-04-14 ENCOUNTER — Other Ambulatory Visit: Payer: Self-pay | Admitting: Student

## 2022-04-14 ENCOUNTER — Encounter: Payer: Self-pay | Admitting: Family Medicine

## 2022-04-14 DIAGNOSIS — J302 Other seasonal allergic rhinitis: Secondary | ICD-10-CM

## 2022-04-18 ENCOUNTER — Other Ambulatory Visit: Payer: Self-pay

## 2022-04-18 DIAGNOSIS — F32A Depression, unspecified: Secondary | ICD-10-CM

## 2022-04-18 MED ORDER — VENLAFAXINE HCL 75 MG PO TABS: ORAL_TABLET | ORAL | 3 refills | Status: DC

## 2022-04-29 ENCOUNTER — Other Ambulatory Visit (HOSPITAL_COMMUNITY): Payer: Self-pay

## 2022-04-29 ENCOUNTER — Other Ambulatory Visit: Payer: Self-pay | Admitting: Student

## 2022-04-29 DIAGNOSIS — E8881 Metabolic syndrome: Secondary | ICD-10-CM

## 2022-04-29 MED ORDER — WEGOVY 0.25 MG/0.5ML ~~LOC~~ SOAJ
0.2500 mg | SUBCUTANEOUS | 0 refills | Status: DC
  Filled 2022-04-29 – 2022-05-02 (×2): qty 2, 28d supply, fill #0

## 2022-05-02 ENCOUNTER — Other Ambulatory Visit (HOSPITAL_COMMUNITY): Payer: Self-pay

## 2022-05-03 ENCOUNTER — Encounter: Payer: Self-pay | Admitting: Student

## 2022-05-03 ENCOUNTER — Telehealth: Payer: Self-pay

## 2022-05-03 NOTE — Telephone Encounter (Signed)
Prior Auth for patients medication WEGOVY denied by [employer] via CoverMyMeds.   Reason: Drugs, when used for anorexia, weight loss or weight gain, are excluded from coverage under Medicare rules  CoverMyMeds Key: [id number]  Pt informed via MyChart message.

## 2022-05-03 NOTE — Telephone Encounter (Signed)
A Prior Authorization was initiated for this patients WEGOVY through CoverMyMeds.   Key: [id number]

## 2022-05-05 ENCOUNTER — Encounter: Payer: Self-pay | Admitting: Student

## 2022-05-05 ENCOUNTER — Ambulatory Visit (INDEPENDENT_AMBULATORY_CARE_PROVIDER_SITE_OTHER): Payer: Medicare Other | Admitting: Student

## 2022-05-05 VITALS — BP 130/89 | HR 71 | Ht 69.0 in | Wt 253.5 lb

## 2022-05-05 DIAGNOSIS — I10 Essential (primary) hypertension: Secondary | ICD-10-CM

## 2022-05-05 DIAGNOSIS — M75101 Unspecified rotator cuff tear or rupture of right shoulder, not specified as traumatic: Secondary | ICD-10-CM | POA: Insufficient documentation

## 2022-05-05 NOTE — Patient Instructions (Addendum)
It was great to see you today! Thank you for choosing [facility] for your primary care. Kyle Kennedy was seen for follow up.  Today we addressed: Continuing with orthopedic appt to discuss surgery options for shoulder   Eat at least 3 REAL meals and 1-2 snacks per day.  Eat breakfast within one hour of getting up.  Aim for no more than 5 hours between eating.   A REAL meal includes at least some protein, some starch, and vegetables and/or fruit.   (OR: Would you serve this to a guest in your home, and call it a meal?)  The Division of Responsibility for Eating is to foster EATING COMPETENCE in children: - The parent is responsible for the what, where, and when of feeding.  - The child is responsible for how much and whether or not of eating.    - For information on child nutrition, see StrictlyIdeas.no.  TASTE PREFERENCES ARE LEARNED.  This means it will get easier to choose foods you know are good for you if you are exposed to them enough.    Go for a walk daily   Make three lists of vegetables:  Those you like and eat now Vegetables you won't even consider Vegetables you might consider trying if they are prepared a certain way  Continue to eat vegetables you currently eat, but from list #3, choose a vegetable to try at least 3 times a week.  Use small amounts of this vegetable, cut small, combined with foods or seasonings you like.    If you haven't already, sign up for My Chart to have easy access to your labs results, and communication with your primary care physician.  We are checking some labs today. If they are abnormal, I will call you. If they are normal, I will send you a MyChart message (if it is active) or a letter in the mail. If you do not hear about your labs in the next 2 weeks, please call the office.   You should return to our clinic Return in about 2 months (around 07/06/2022).  I recommend that you always bring your medications to each  appointment as this makes it easy to ensure you are on the correct medications and helps [place] not miss refills when you need them.  Please arrive 15 minutes before your appointment to ensure smooth check in process.  We appreciate your efforts in making this happen.  Please call the clinic at [phone] if your symptoms worsen or you have any concerns.  Thank you for allowing me to participate in your care, [name], MD PGY-2 Family Medicine

## 2022-05-05 NOTE — Assessment & Plan Note (Signed)
Per MRI, right supraspinatus full thickness tear Continue with conservative treatment until seen Ortho in 1 week to discuss surgical options If patient would like to have steroid injection, could see sports medicine in the interim.  However, unsure given prediabetes status.

## 2022-05-05 NOTE — Assessment & Plan Note (Signed)
Unable to have coverage with insurance for [facility].  Work on walking weekly and incorporating 3 veggies a week

## 2022-05-05 NOTE — Assessment & Plan Note (Addendum)
BP: 130/89 today. Well controlled. Goal met. Continue to work on healthy dietary habits and exercise.  Medication regimen: Amlodipine and Lisinopril.

## 2022-05-05 NOTE — Progress Notes (Signed)
  SUBJECTIVE:   CHIEF COMPLAINT / HPI:   Right Supraspinatus Tear:  -Has appt with surgeon on 27th of this month; Dr. Stann Mainland  -Pain: Comes and goes, can be very painful or feel as if it pops out of place   Concerned about lack of Wegovy coverage.   Hypertension: BP: 130/89 today. Home medications include: Lisinopril and Amlodipine. He endorses taking these medications as prescribed. Does check blood pressure at home. Denies any SOB, CP, vision changes, LE edema, medication SEs, or symptoms of hypotension. Diet same as previously, sedentary lifestyle. Exercise: sedentary. Most recent creatinine trend:  Lab Results  Component Value Date   CREATININE 1.03 07/09/2021   CREATININE 1.06 12/29/2017   CREATININE 0.99 02/08/2017   Patient has had a BMP in the past 1 year.  PERTINENT  PMH / PSH:   GERD, depression, obesity, prediabetes, HTN   OBJECTIVE:  BP 130/89   Pulse 71   Ht $R'5\' 9"'Xt$  (1.753 m)   Wt 253 lb 8 oz (115 kg)   SpO2 97%   BMI 37.44 kg/m   General: NAD, pleasant, able to participate in exam; pleasant caucasian male  Cardiac: RRR, no murmurs auscultated Respiratory: CTAB, normal WOB Abdomen: soft, non-tender, non-distended, normoactive bowel sounds Extremities: warm and well perfused, no edema or cyanosis Skin: warm and dry, no rashes noted Psych: Normal affect and mood  ASSESSMENT/PLAN:  Hypertension BP: 130/89 today. Well controlled. Goal met. Continue to work on healthy dietary habits and exercise.  Medication regimen: Amlodipine and Lisinopril.    Severe obesity (BMI >= 40) (HCC) Unable to have coverage with insurance for [facility].  Work on walking weekly and incorporating 3 veggies a week    Tear of right supraspinatus tendon Per MRI, right supraspinatus full thickness tear Continue with conservative treatment until seen Ortho in 1 week to discuss surgical options If patient would like to have steroid injection, could see sports medicine in the interim.   However, unsure given prediabetes status.  BMP ordered to assess electrolytes as he previously had slight elevation in Na and Ca 10 months ago.    Orders Placed This Encounter  Procedures   Basic Metabolic Panel   No orders of the defined types were placed in this encounter.  Return in about 2 months (around 07/06/2022).  Erskine Emery, MD PGY-2 Family Medicine

## 2022-05-06 LAB — BASIC METABOLIC PANEL
BUN/Creatinine Ratio: 11 (ref 9–20)
BUN: 11 mg/dL (ref 6–24)
CO2: 23 mmol/L (ref 20–29)
Calcium: 10.3 mg/dL — ABNORMAL HIGH (ref 8.7–10.2)
Chloride: 102 mmol/L (ref 96–106)
Creatinine, Ser: 0.98 mg/dL (ref 0.76–1.27)
Glucose: 96 mg/dL (ref 70–99)
Potassium: 4.5 mmol/L (ref 3.5–5.2)
Sodium: 141 mmol/L (ref 134–144)
eGFR: 92 mL/min/{1.73_m2} (ref >59)

## 2022-05-07 ENCOUNTER — Encounter: Payer: Self-pay | Admitting: Student

## 2022-05-10 ENCOUNTER — Encounter: Payer: Self-pay | Admitting: Student

## 2022-05-11 ENCOUNTER — Encounter: Payer: Self-pay | Admitting: Student

## 2022-05-11 ENCOUNTER — Other Ambulatory Visit: Payer: Self-pay | Admitting: Student

## 2022-05-11 DIAGNOSIS — R7303 Prediabetes: Secondary | ICD-10-CM

## 2022-05-11 DIAGNOSIS — I10 Essential (primary) hypertension: Secondary | ICD-10-CM

## 2022-05-11 DIAGNOSIS — E8881 Metabolic syndrome: Secondary | ICD-10-CM

## 2022-05-11 DIAGNOSIS — E785 Hyperlipidemia, unspecified: Secondary | ICD-10-CM

## 2022-05-11 MED ORDER — SEMAGLUTIDE(0.25 OR 0.5MG/DOS) 2 MG/1.5ML ~~LOC~~ SOPN: 0.2500 mg | PEN_INJECTOR | SUBCUTANEOUS | 0 refills | Status: DC

## 2022-05-11 NOTE — Progress Notes (Signed)
Sent in Ozempic per patient request due to insurance

## 2022-05-12 DIAGNOSIS — M75121 Complete rotator cuff tear or rupture of right shoulder, not specified as traumatic: Secondary | ICD-10-CM | POA: Diagnosis not present

## 2022-05-13 ENCOUNTER — Encounter: Payer: Self-pay | Admitting: Student

## 2022-05-17 MED ORDER — ESOMEPRAZOLE MAGNESIUM 40 MG PO CPDR
40.0000 mg | DELAYED_RELEASE_CAPSULE | Freq: Two times a day (BID) | ORAL | 3 refills | Status: DC
Start: 2022-05-17 — End: 2023-06-15

## 2022-05-19 ENCOUNTER — Encounter: Payer: Self-pay | Admitting: Student

## 2022-06-01 NOTE — Progress Notes (Signed)
Sent message, via epic in basket, requesting orders in epic from surgeon.  

## 2022-06-02 ENCOUNTER — Encounter: Payer: Self-pay | Admitting: Primary Care

## 2022-06-02 ENCOUNTER — Ambulatory Visit (INDEPENDENT_AMBULATORY_CARE_PROVIDER_SITE_OTHER): Payer: Medicare Other | Admitting: Primary Care

## 2022-06-02 DIAGNOSIS — G4733 Obstructive sleep apnea (adult) (pediatric): Secondary | ICD-10-CM | POA: Diagnosis not present

## 2022-06-02 NOTE — Assessment & Plan Note (Signed)
-   Patient is 100% compliant with CPAP over the last 30 days and reports benefit from use . Current pressure setting 5 - 15 cm H2O; residual AHI 0.2/HR. No issues with mask or pressure settings.  Advised patient continue to wear CPAP every night for minimum 4 to 6 hours and avoid driving experiencing excessive daytime sleepiness or fatigue.  We will renew CPAP supplies with [employer].  Follow-up in 1 year or sooner if needed.

## 2022-06-02 NOTE — Progress Notes (Signed)
@Patient  ID: , male    DOB: 1971-10-05, 51 y.o.   MRN: [id number]  Chief Complaint  Patient presents with   Consult    Has CPAP-    Referring provider: [id number], MD  HPI: 51 year old male, never smoked.  Past medical history significant for OSA.  Patient of Dr. 40, last seen in office 12/11/2018.  06/02/2022 Patient presents today to reestablish care for OSA. Patient is sleeping well at night.Compliant with CPAP use. Current pressure settings 5 to 15 cm H2O without residual apneas. No issues with mask fit or pressure setting. He uses full face mask. He needs supplies renewed through [employer].   Airview download 05/02/2022 - 05/31/2022 Usage days 30/30 days (100%); 30 days (100%) greater than 4 hours Average usage 9 hours 4 minutes Pressure 5-15 (11.6cm h20-95%) Air leaks 23.1L/min AHI 0.2   Sleep questionnaire Symptoms-  Hx sleep apnea Prior sleep study- June Bedtime- 9pm-11pm Time to fall asleep- varies  Nocturnal awakenings- 1-3 times Out of bed/start of day- 5:50am Weight changes- NA Do you operate heavy machinery- No Do you currently wear CPAP- Yes Do you current wear oxygen- No Epworth- 4    No Known Allergies  Immunization History  Administered Date(s) Administered   Influenza, Quadrivalent, Recombinant, Inj, Pf 07/09/2019   Influenza,inj,Quad PF,6+ Mos 08/07/2013, 08/04/2017, 07/17/2018, 07/09/2021   Influenza-Unspecified 07/18/2015   MODERNA COVID-19 SARS-COV-2 PEDS BIVALENT BOOSTER 6Y-11Y 07/30/2021   Moderna Sars-Covid-2 Vaccination 12/19/2019, 01/16/2020, 07/11/2020, 01/25/2021   Tdap 08/07/2013   Zoster Recombinat (Shingrix) 08/14/2020    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    right   Depression    Double vision    Fatigue    GERD (gastroesophageal reflux disease)    Gout    Hiatal  hernia    Hyperlipidemia    Hypertension    Knee pain    right   Loss of appetite    Night sweats    Osteoarthritis    Peptic stricture of esophagus    Shortness of breath    with exertion   Sleep apnea    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     allopurinol (ZYLOPRIM) 100 MG tablet TAKE 1 TABLET BY MOUTH DAILY 90 tablet 3   amLODipine (NORVASC) 10 MG tablet TAKE 1 TABLET(10 MG) BY MOUTH DAILY 90 tablet 1   buPROPion (WELLBUTRIN XL) 150 MG 24 hr tablet TAKE 1 TABLET(150 MG) BY MOUTH DAILY 90 tablet 1   esomeprazole (NEXIUM) 40 MG capsule Take 1 capsule (40 mg total) by mouth 2 (two) times daily. 180 capsule 3   fluticasone (FLONASE) 50 MCG/ACT nasal spray Place 2 sprays into both nostrils daily as needed for allergies or rhinitis. 9.9 mL 2   gabapentin (NEURONTIN) 600 MG tablet Take 1.5 tablets (900 mg total) by mouth daily as needed. 90 tablet  3   levocetirizine (XYZAL) 5 MG tablet TAKE 1 TABLET(5 MG) BY MOUTH EVERY EVENING 90 tablet 3   lisinopril (ZESTRIL) 20 MG tablet TAKE 1 TABLET(20 MG) BY MOUTH AT BEDTIME 90 tablet 1   loratadine (CLARITIN REDITABS) 10 MG dissolvable tablet Take 1 tablet (10 mg total) by mouth daily. As needed for allergy symptoms 120 tablet 0   Multiple Vitamin (MULTI-VITAMIN DAILY PO) Take by mouth.     naproxen (NAPROSYN) 500 MG tablet Take 1 tablet (500 mg total) by mouth 2 (two) times daily as needed. 30 tablet 0   Omega-3 300 MG CAPS Take 300 mg by mouth daily.     OVER THE COUNTER MEDICATION Take 1 tablet by mouth daily. Visiclear     Semaglutide,0.25 or  0.5MG /DOS, 2 MG/1.5ML SOPN Inject 0.25 mg into the skin once a week. 0.25 mg once weekly for 4 weeks then increase to 0.5 mg weekly for at least 4 weeks,max 1 mg 0.75 mL 0   simvastatin (ZOCOR) 40 MG tablet TAKE 1 TABLET BY MOUTH DAILY 90 tablet 2   venlafaxine (EFFEXOR) 75 MG tablet Take 3 tablets (225mg ) by mouth daily. 180 tablet 3   triamcinolone (KENALOG) 0.025 % ointment Apply 1 application topically 2 (two) times daily. (Patient not taking: Reported on 06/02/2022) 30 g 0   No facility-administered medications prior to visit.   Review of Systems  Review of Systems  Constitutional: Negative.   HENT: Negative.    Respiratory: Negative.    Cardiovascular: Negative.      Physical Exam  BP 130/70 (BP Location: Left Arm, Cuff Size: Large)   Pulse 72   Temp 98.1 F (36.7 C) (Temporal)   Ht 5\' 7"  (1.702 m)   Wt 249 lb 9.6 oz (113.2 kg)   SpO2 98%   BMI 39.09 kg/m  Physical Exam Constitutional:      Appearance: Normal appearance.  HENT:     Head: Normocephalic and atraumatic.     Mouth/Throat:     Mouth: Mucous membranes are moist.     Pharynx: Oropharynx is clear.  Cardiovascular:     Rate and Rhythm: Normal rate and regular rhythm.  Pulmonary:     Effort: Pulmonary effort is normal.     Breath sounds: Normal breath sounds.  Musculoskeletal:        General: Normal range of motion.  Skin:    General: Skin is warm and dry.  Neurological:     General: No focal deficit present.     Mental Status: He is alert and oriented to person, place, and time. Mental status is at baseline.  Psychiatric:        Mood and Affect: Mood normal.        Behavior: Behavior normal.        Thought Content: Thought content normal.        Judgment: Judgment normal.      Lab Results:  CBC    Component Value Date/Time   WBC 5.9 08/20/2021 1104   RBC 4.85 08/20/2021 1104   HGB 14.4 08/20/2021 1104   HGB 11.1 (L) 10/30/2013 0636   HCT 41.7 08/20/2021 1104   HCT 40.6 10/16/2013 1553    PLT 234 08/20/2021 1104   PLT 201 10/30/2013 0636   MCV 86.0 08/20/2021 1104   MCV 84 10/16/2013 1553   MCH 29.7 08/20/2021 1104   MCHC 34.5 08/20/2021 1104   RDW 13.2 08/20/2021 1104   RDW 13.1 10/16/2013 1553   LYMPHSABS 2.6  11/11/2010 1146   MONOABS 0.5 11/11/2010 1146   EOSABS 0.0 11/11/2010 1146   BASOSABS 0.1 11/11/2010 1146    BMET    Component Value Date/Time   NA 141 05/05/2022 1227   NA 140 10/30/2013 0636   K 4.5 05/05/2022 1227   K 3.9 10/30/2013 0636   CL 102 05/05/2022 1227   CL 107 10/30/2013 0636   CO2 23 05/05/2022 1227   CO2 29 10/30/2013 0636   GLUCOSE 96 05/05/2022 1227   GLUCOSE 131 (H) 12/29/2015 1510   GLUCOSE 126 (H) 10/30/2013 0636   BUN 11 05/05/2022 1227   BUN 9 10/30/2013 0636   CREATININE 0.98 05/05/2022 1227   CREATININE 0.89 12/29/2015 1510   CALCIUM 10.3 (H) 05/05/2022 1227   CALCIUM 8.7 10/30/2013 0636   GFRNONAA 83 12/29/2017 0851   GFRNONAA >89 12/29/2015 1510   GFRAA 95 12/29/2017 0851   GFRAA >89 12/29/2015 1510    BNP No results found for: "BNP"  ProBNP No results found for: "PROBNP"  Imaging: No results found.   Assessment & Plan:   OSA (obstructive sleep apnea) - Patient is 100% compliant with CPAP over the last 30 days and reports benefit from use . Current pressure setting 5 - 15 cm H2O; residual AHI 0.2/HR. No issues with mask or pressure settings.  Advised patient continue to wear CPAP every night for minimum 4 to 6 hours and avoid driving experiencing excessive daytime sleepiness or fatigue.  We will renew CPAP supplies with [employer].  Follow-up in 1 year or sooner if needed.   Glenford Bayley, NP 06/02/2022

## 2022-06-02 NOTE — Patient Instructions (Signed)
CPAP download showed excellent compliance, no pressure changes needed today Continue to wear CPAP every night for minimum of 4 to 6 hours Do not drive if experiencing excessive daytime sleepiness or fatigue  Orders Renew CPAP supplies with [employer]  Follow-up 1 year with Dr. Vassie Loll

## 2022-06-06 NOTE — Progress Notes (Signed)
COVID Vaccine received:  []  No [x]  Yes Date of any COVID positive Test in last 90 days:  PCP - , MD    sees Resident at [facility] Cardiologist -  Pulmonolgy- Terisa Starr, NP  Chest x-ray -  EKG -  11-09-21  Epic Stress Test -  ECHO -  Cardiac Cath -   Pacemaker/ICD device     []  N/A Spinal Cord Stimulator:[]  No []  Yes   Other Implants:   History of Sleep Apnea? []  No [x]  Yes   Sleep Study Date:  03-2019 CPAP used?- []  No [x]  Yes  (Instruct to bring their mask & Tubing)  Does the patient monitor blood sugar? []  No []  Yes  []  N/A Does patient have a 11-11-21 or Dexacom? []  No []  Yes   Fasting Blood Sugar Ranges-  Checks Blood Sugar _____ times a day  Blood Thinner Instructions: Aspirin Instructions: Last Dose:  ERAS Protocol Ordered: []  No  [x]  Yes PRE-SURGERY []  ENSURE  [x]  G2   Comments:   Activity level: Patient can / can not climb a flight of stairs without difficulty;  []  No CP  []  No SOB,  but would have ______   Anesthesia review: OSA (CPAP), hx TBI w/ Optic nerve damage, HTN, Pre-DM  Patient denies shortness of breath, fever, cough and chest pain at PAT appointment.  Patient verbalized understanding and agreement to the Pre-Surgical Instructions that were given to them at this PAT appointment. Patient was also educated of the need to review these PAT instructions again prior to his/her surgery.I reviewed the appropriate phone numbers to call if they have any and questions or concerns.

## 2022-06-06 NOTE — Patient Instructions (Signed)
DUE TO SPACE LIMITATIONS, ONLY TWO VISITORS  (aged 53 and older) ARE ALLOWED TO COME WITH YOU AND STAY IN THE WAITING ROOM DURING YOUR PRE OP AND PROCEDURE.   **NO VISITORS ARE ALLOWED IN THE SHORT STAY AREA OR RECOVERY ROOM!!**  You are not required to quarantine at this time prior to your surgery. However, you must do this: Hand Hygiene often Do NOT share personal items Notify your provider if you are in close contact with someone who has COVID or you develop fever 100.4 or greater, new onset of sneezing, cough, sore throat, shortness of breath or body aches.       Your procedure is scheduled on: Friday  June 17, 2022   Report to [facility] Main Entrance.  Report to admitting at: 05:15 am  +++++Call this number if you have any questions or problems the morning of surgery [phone]  Do not eat food :After Midnight the night prior to your surgery/procedure.  After Midnight you may have the following liquids until  04:15 AM DAY OF SURGERY  Clear Liquid Diet Water Black Coffee (sugar ok, NO MILK/CREAM OR CREAMERS)  Tea (sugar ok, NO MILK/CREAM OR CREAMERS) regular and decaf                             Plain Jell-O (NO RED)                                           Fruit ices (not with fruit pulp, NO RED)                                     Popsicles (NO RED)                                                                  Juice: apple, WHITE grape, WHITE cranberry Sports drinks like Gatorade (NO RED)                    The day of surgery:  Drink ONE (1) Pre-Surgery  G2 at  04:15 AM the morning of surgery. Drink in one sitting. Do not sip.  This drink was given to you during your hospital pre-op appointment visit. Nothing else to drink after completing the Pre-Surgery G2.    FOLLOW  ADDITIONAL PRE OP INSTRUCTIONS YOU RECEIVED FROM YOUR SURGEON'S OFFICE!!!   Oral Hygiene is also important to reduce your risk of infection.        Remember - BRUSH YOUR TEETH THE  MORNING OF SURGERY WITH YOUR REGULAR TOOTHPASTE   Take ONLY these medicines the morning of surgery with A SIP OF WATER: Bupropion (Wellbutrin), venlafaxine (Effexor), Amlodipine, Allopurinol, esomeprazole (Nexium). You may useTylenol, gabapentin and Flonase spray if needed.    Bring CPAP mask and tubing day of surgery.                   You may not have any metal on your body including  jewelry, and body piercing  Do not wear  lotions, powders, cologne, or deodorant   Men may shave face and neck.  DO NOT BRING YOUR HOME MEDICATIONS TO THE HOSPITAL. PHARMACY WILL DISPENSE MEDICATIONS LISTED ON YOUR MEDICATION LIST TO YOU DURING YOUR ADMISSION IN THE HOSPITAL!   Patients discharged on the day of surgery will not be allowed to drive home.  Someone NEEDS to stay with you for the first 24 hours after anesthesia.  Please read over the following fact sheets you were given: IF YOU HAVE QUESTIONS ABOUT YOUR PRE-OP INSTRUCTIONS, PLEASE CALL [phone]  (KAY)   [facility] - Preparing for Surgery Before surgery, you can play an important role.  Because skin is not sterile, your skin needs to be as free of germs as possible.  You can reduce the number of germs on your skin by washing with CHG (chlorahexidine gluconate) soap before surgery.  CHG is an antiseptic cleaner which kills germs and bonds with the skin to continue killing germs even after washing. Please DO NOT use if you have an allergy to CHG or antibacterial soaps.  If your skin becomes reddened/irritated stop using the CHG and inform your nurse when you arrive at Short Stay. Do not shave (including legs and underarms) for at least 48 hours prior to the first CHG shower.  You may shave your face/neck.  Please follow these instructions carefully:  1.  Shower with CHG Soap the night before surgery and the  morning of surgery.  2.  If you choose to wash your hair, wash your hair first as usual with your normal  shampoo.  3.  After you  shampoo, rinse your hair and body thoroughly to remove the shampoo.                             4.  Use CHG as you would any other liquid soap.  You can apply chg directly to the skin and wash.  Gently with a scrungie or clean washcloth.  5.  Apply the CHG Soap to your body ONLY FROM THE NECK DOWN.   Do not use on face/ open                           Wound or open sores. Avoid contact with eyes, ears mouth and genitals (private parts).                       Wash face,  Genitals (private parts) with your normal soap.             6.  Wash thoroughly, paying special attention to the area where your  surgery  will be performed.  7.  Thoroughly rinse your body with warm water from the neck down.  8.  DO NOT shower/wash with your normal soap after using and rinsing off the CHG Soap.            9.  Pat yourself dry with a clean towel.            10.  Wear clean pajamas.            11.  Place clean sheets on your bed the night of your first shower and do not  sleep with pets.  ON THE DAY OF SURGERY : Do not apply any lotions/deodorants the morning of surgery.  Please wear clean clothes to the hospital/surgery center.    FAILURE TO  FOLLOW THESE INSTRUCTIONS MAY RESULT IN THE CANCELLATION OF YOUR SURGERY  PATIENT SIGNATURE_________________________________  NURSE SIGNATURE__________________________________  ________________________________________________________________________   Rogelia Mire    An incentive spirometer is a tool that can help keep your lungs clear and active. This tool measures how well you are filling your lungs with each breath. Taking long deep breaths may help reverse or decrease the chance of developing breathing (pulmonary) problems (especially infection) following: A long period of time when you are unable to move or be active. BEFORE THE PROCEDURE  If the spirometer includes an indicator to show your best effort, your nurse or respiratory therapist will set it to  a desired goal. If possible, sit up straight or lean slightly forward. Try not to slouch. Hold the incentive spirometer in an upright position. INSTRUCTIONS FOR USE  Sit on the edge of your bed if possible, or sit up as far as you can in bed or on a chair. Hold the incentive spirometer in an upright position. Breathe out normally. Place the mouthpiece in your mouth and seal your lips tightly around it. Breathe in slowly and as deeply as possible, raising the piston or the ball toward the top of the column. Hold your breath for 3-5 seconds or for as long as possible. Allow the piston or ball to fall to the bottom of the column. Remove the mouthpiece from your mouth and breathe out normally. Rest for a few seconds and repeat Steps 1 through 7 at least 10 times every 1-2 hours when you are awake. Take your time and take a few normal breaths between deep breaths. The spirometer may include an indicator to show your best effort. Use the indicator as a goal to work toward during each repetition. After each set of 10 deep breaths, practice coughing to be sure your lungs are clear. If you have an incision (the cut made at the time of surgery), support your incision when coughing by placing a pillow or rolled up towels firmly against it. Once you are able to get out of bed, walk around indoors and cough well. You may stop using the incentive spirometer when instructed by your caregiver.  RISKS AND COMPLICATIONS Take your time so you do not get dizzy or light-headed. If you are in pain, you may need to take or ask for pain medication before doing incentive spirometry. It is harder to take a deep breath if you are having pain. AFTER USE Rest and breathe slowly and easily. It can be helpful to keep track of a log of your progress. Your caregiver can provide you with a simple table to help with this. If you are using the spirometer at home, follow these instructions: SEEK MEDICAL CARE IF:  You are having  difficultly using the spirometer. You have trouble using the spirometer as often as instructed. Your pain medication is not giving enough relief while using the spirometer. You develop fever of 100.5 F (38.1 C) or higher.  SEEK IMMEDIATE MEDICAL CARE IF:  You cough up bloody sputum that had not been present before. You develop fever of 102 F (38.9 C) or greater. You develop worsening pain at or near the incision site. MAKE SURE YOU:  Understand these instructions. Will watch your condition. Will get help right away if you are not doing well or get worse. Document Released: 02/13/2007 Document Revised: 12/26/2011 Document Reviewed: 04/16/2007 [facility] Patient Information 2014 [place], [place].

## 2022-06-08 ENCOUNTER — Other Ambulatory Visit: Payer: Self-pay

## 2022-06-08 ENCOUNTER — Encounter (HOSPITAL_COMMUNITY)
Admission: RE | Admit: 2022-06-08 | Discharge: 2022-06-08 | Disposition: A | Discharge status: Home / Self Care | Payer: Medicare Other | Source: Ambulatory Visit | Attending: Transplant Surgery | Admitting: Transplant Surgery

## 2022-06-08 ENCOUNTER — Encounter (HOSPITAL_COMMUNITY): Payer: Self-pay

## 2022-06-08 VITALS — BP 129/93 | HR 66 | Temp 98.4°F | Resp 22 | Ht 67.0 in | Wt 251.0 lb

## 2022-06-08 DIAGNOSIS — R7303 Prediabetes: Secondary | ICD-10-CM | POA: Diagnosis not present

## 2022-06-08 DIAGNOSIS — Z01812 Encounter for preprocedural laboratory examination: Secondary | ICD-10-CM | POA: Diagnosis not present

## 2022-06-08 DIAGNOSIS — I10 Essential (primary) hypertension: Secondary | ICD-10-CM | POA: Insufficient documentation

## 2022-06-08 HISTORY — DX: Prediabetes: R73.03

## 2022-06-08 LAB — GLUCOSE, CAPILLARY: Glucose-Capillary: 134 mg/dL — ABNORMAL HIGH (ref 70–99)

## 2022-06-08 LAB — CBC
HCT: 42.9 % (ref 39.0–52.0)
Hemoglobin: 14.8 g/dL (ref 13.0–17.0)
MCH: 30 pg (ref 26.0–34.0)
MCHC: 34.5 g/dL (ref 30.0–36.0)
MCV: 87 fL (ref 80.0–100.0)
Platelets: 250 10*3/uL (ref 150–400)
RBC: 4.93 MIL/uL (ref 4.22–5.81)
RDW: 12.8 % (ref 11.5–15.5)
WBC: 6 10*3/uL (ref 4.0–10.5)
nRBC: 0 % (ref 0.0–0.2)

## 2022-06-08 LAB — HEMOGLOBIN A1C
Hgb A1c MFr Bld: 5.4 % (ref 4.8–5.6)
Mean Plasma Glucose: 108.28 mg/dL

## 2022-06-08 LAB — BASIC METABOLIC PANEL
Anion gap: 9 (ref 5–15)
BUN: 10 mg/dL (ref 6–20)
CO2: 25 mmol/L (ref 22–32)
Calcium: 9.9 mg/dL (ref 8.9–10.3)
Chloride: 107 mmol/L (ref 98–111)
Creatinine, Ser: 0.85 mg/dL (ref 0.61–1.24)
GFR, Estimated: >60 mL/min (ref >60)
Glucose, Bld: 107 mg/dL — ABNORMAL HIGH (ref 70–99)
Potassium: 4 mmol/L (ref 3.5–5.1)
Sodium: 141 mmol/L (ref 135–145)

## 2022-06-09 ENCOUNTER — Other Ambulatory Visit: Payer: Self-pay | Admitting: Student

## 2022-06-09 DIAGNOSIS — R7303 Prediabetes: Secondary | ICD-10-CM

## 2022-06-09 DIAGNOSIS — E8881 Metabolic syndrome: Secondary | ICD-10-CM

## 2022-06-09 DIAGNOSIS — I10 Essential (primary) hypertension: Secondary | ICD-10-CM

## 2022-06-09 DIAGNOSIS — E785 Hyperlipidemia, unspecified: Secondary | ICD-10-CM

## 2022-06-14 DIAGNOSIS — G4733 Obstructive sleep apnea (adult) (pediatric): Secondary | ICD-10-CM | POA: Diagnosis not present

## 2022-06-17 ENCOUNTER — Other Ambulatory Visit: Payer: Self-pay

## 2022-06-17 ENCOUNTER — Ambulatory Visit (HOSPITAL_COMMUNITY)
Admission: RE | Admit: 2022-06-17 | Discharge: 2022-06-17 | Disposition: A | Discharge status: Home / Self Care | Payer: Medicare Other | Attending: Orthopedic Surgery | Admitting: Orthopedic Surgery

## 2022-06-17 ENCOUNTER — Encounter (HOSPITAL_COMMUNITY)
Admission: RE | Disposition: A | Discharge status: Home / Self Care | Payer: Self-pay | Source: Home / Self Care | Attending: Orthopedic Surgery

## 2022-06-17 ENCOUNTER — Ambulatory Visit (HOSPITAL_COMMUNITY): Payer: Medicare Other | Admitting: Physician Assistant

## 2022-06-17 ENCOUNTER — Encounter (HOSPITAL_COMMUNITY): Payer: Self-pay | Admitting: Orthopedic Surgery

## 2022-06-17 ENCOUNTER — Ambulatory Visit (HOSPITAL_BASED_OUTPATIENT_CLINIC_OR_DEPARTMENT_OTHER): Payer: Medicare Other | Admitting: Certified Registered Nurse Anesthetist

## 2022-06-17 DIAGNOSIS — R7303 Prediabetes: Secondary | ICD-10-CM

## 2022-06-17 DIAGNOSIS — S43491A Other sprain of right shoulder joint, initial encounter: Secondary | ICD-10-CM | POA: Diagnosis not present

## 2022-06-17 DIAGNOSIS — M7551 Bursitis of right shoulder: Secondary | ICD-10-CM | POA: Diagnosis not present

## 2022-06-17 DIAGNOSIS — S43431A Superior glenoid labrum lesion of right shoulder, initial encounter: Secondary | ICD-10-CM | POA: Diagnosis not present

## 2022-06-17 DIAGNOSIS — X58XXXA Exposure to other specified factors, initial encounter: Secondary | ICD-10-CM | POA: Insufficient documentation

## 2022-06-17 DIAGNOSIS — M7541 Impingement syndrome of right shoulder: Secondary | ICD-10-CM

## 2022-06-17 DIAGNOSIS — G8918 Other acute postprocedural pain: Secondary | ICD-10-CM | POA: Diagnosis not present

## 2022-06-17 DIAGNOSIS — M94211 Chondromalacia, right shoulder: Secondary | ICD-10-CM | POA: Insufficient documentation

## 2022-06-17 DIAGNOSIS — S46011A Strain of muscle(s) and tendon(s) of the rotator cuff of right shoulder, initial encounter: Secondary | ICD-10-CM | POA: Insufficient documentation

## 2022-06-17 DIAGNOSIS — G4733 Obstructive sleep apnea (adult) (pediatric): Secondary | ICD-10-CM | POA: Diagnosis not present

## 2022-06-17 DIAGNOSIS — I10 Essential (primary) hypertension: Secondary | ICD-10-CM

## 2022-06-17 DIAGNOSIS — Z87820 Personal history of traumatic brain injury: Secondary | ICD-10-CM | POA: Diagnosis not present

## 2022-06-17 DIAGNOSIS — M25811 Other specified joint disorders, right shoulder: Secondary | ICD-10-CM | POA: Diagnosis not present

## 2022-06-17 DIAGNOSIS — K219 Gastro-esophageal reflux disease without esophagitis: Secondary | ICD-10-CM | POA: Diagnosis not present

## 2022-06-17 DIAGNOSIS — K449 Diaphragmatic hernia without obstruction or gangrene: Secondary | ICD-10-CM | POA: Insufficient documentation

## 2022-06-17 DIAGNOSIS — Z9989 Dependence on other enabling machines and devices: Secondary | ICD-10-CM | POA: Diagnosis not present

## 2022-06-17 DIAGNOSIS — G4730 Sleep apnea, unspecified: Secondary | ICD-10-CM | POA: Diagnosis not present

## 2022-06-17 DIAGNOSIS — F32A Depression, unspecified: Secondary | ICD-10-CM | POA: Insufficient documentation

## 2022-06-17 DIAGNOSIS — M7521 Bicipital tendinitis, right shoulder: Secondary | ICD-10-CM | POA: Diagnosis not present

## 2022-06-17 DIAGNOSIS — Z79899 Other long term (current) drug therapy: Secondary | ICD-10-CM | POA: Diagnosis not present

## 2022-06-17 DIAGNOSIS — F419 Anxiety disorder, unspecified: Secondary | ICD-10-CM | POA: Diagnosis not present

## 2022-06-17 DIAGNOSIS — M1990 Unspecified osteoarthritis, unspecified site: Secondary | ICD-10-CM | POA: Insufficient documentation

## 2022-06-17 DIAGNOSIS — M75101 Unspecified rotator cuff tear or rupture of right shoulder, not specified as traumatic: Secondary | ICD-10-CM | POA: Diagnosis not present

## 2022-06-17 DIAGNOSIS — S4381XA Sprain of other specified parts of right shoulder girdle, initial encounter: Secondary | ICD-10-CM | POA: Diagnosis not present

## 2022-06-17 HISTORY — PX: SHOULDER ARTHROSCOPY WITH ROTATOR CUFF REPAIR: SHX5685

## 2022-06-17 HISTORY — PX: SHOULDER ARTHROSCOPY WITH SUBACROMIAL DECOMPRESSION: SHX5684

## 2022-06-17 HISTORY — PX: BICEPT TENODESIS: SHX5116

## 2022-06-17 LAB — GLUCOSE, CAPILLARY: Glucose-Capillary: 87 mg/dL (ref 70–99)

## 2022-06-17 SURGERY — ARTHROSCOPY, SHOULDER, WITH ROTATOR CUFF REPAIR
Anesthesia: Regional | Laterality: Right

## 2022-06-17 MED ORDER — CHLORHEXIDINE GLUCONATE 0.12 % MT SOLN
15.0000 mL | Freq: Once | OROMUCOSAL | Status: AC
  Administered 2022-06-17: 15 mL via OROMUCOSAL

## 2022-06-17 MED ORDER — MIDAZOLAM HCL 2 MG/2ML IJ SOLN
INTRAMUSCULAR | Status: AC
  Filled 2022-06-17: qty 2

## 2022-06-17 MED ORDER — PHENYLEPHRINE HCL-NACL 20-0.9 MG/250ML-% IV SOLN
INTRAVENOUS | Status: AC
  Filled 2022-06-17: qty 250

## 2022-06-17 MED ORDER — OXYCODONE-ACETAMINOPHEN 7.5-325 MG PO TABS: 1.0000 | ORAL_TABLET | Freq: Four times a day (QID) | ORAL | 0 refills | Status: AC | PRN

## 2022-06-17 MED ORDER — EPINEPHRINE PF 1 MG/ML IJ SOLN
INTRAMUSCULAR | Status: AC
Start: 2022-06-17 — End: ?
  Filled 2022-06-17: qty 2

## 2022-06-17 MED ORDER — OXYCODONE HCL 5 MG/5ML PO SOLN: 5.0000 mg | Freq: Once | ORAL | Status: DC | PRN

## 2022-06-17 MED ORDER — FENTANYL CITRATE (PF) 100 MCG/2ML IJ SOLN
INTRAMUSCULAR | Status: DC | PRN
Start: 2022-06-17 — End: 2022-06-17
  Administered 2022-06-17: 100 ug via INTRAVENOUS

## 2022-06-17 MED ORDER — SUGAMMADEX SODIUM 500 MG/5ML IV SOLN
INTRAVENOUS | Status: AC
  Filled 2022-06-17: qty 5

## 2022-06-17 MED ORDER — ACETAMINOPHEN 10 MG/ML IV SOLN: 1000.0000 mg | Freq: Once | INTRAVENOUS | Status: DC | PRN

## 2022-06-17 MED ORDER — OXYCODONE HCL 5 MG PO TABS: 5.0000 mg | ORAL_TABLET | Freq: Once | ORAL | Status: DC | PRN

## 2022-06-17 MED ORDER — LACTATED RINGERS IV SOLN: INTRAVENOUS | Status: DC

## 2022-06-17 MED ORDER — BUPIVACAINE HCL (PF) 0.25 % IJ SOLN
INTRAMUSCULAR | Status: AC
  Filled 2022-06-17: qty 30

## 2022-06-17 MED ORDER — PROMETHAZINE HCL 25 MG/ML IJ SOLN: 6.2500 mg | INTRAMUSCULAR | Status: DC | PRN

## 2022-06-17 MED ORDER — LIDOCAINE 2% (20 MG/ML) 5 ML SYRINGE
INTRAMUSCULAR | Status: DC | PRN
  Administered 2022-06-17: 40 mg via INTRAVENOUS

## 2022-06-17 MED ORDER — BUPIVACAINE LIPOSOME 1.3 % IJ SUSP
INTRAMUSCULAR | Status: DC | PRN
  Administered 2022-06-17: 10 mL via PERINEURAL

## 2022-06-17 MED ORDER — ROCURONIUM BROMIDE 10 MG/ML (PF) SYRINGE
PREFILLED_SYRINGE | INTRAVENOUS | Status: DC | PRN
  Administered 2022-06-17: 40 mg via INTRAVENOUS
  Administered 2022-06-17: 20 mg via INTRAVENOUS

## 2022-06-17 MED ORDER — PROPOFOL 10 MG/ML IV BOLUS
INTRAVENOUS | Status: AC
  Filled 2022-06-17: qty 20

## 2022-06-17 MED ORDER — CEFAZOLIN SODIUM-DEXTROSE 2-4 GM/100ML-% IV SOLN
2.0000 g | INTRAVENOUS | Status: AC
  Administered 2022-06-17: 2 g via INTRAVENOUS
  Filled 2022-06-17: qty 100

## 2022-06-17 MED ORDER — SUGAMMADEX SODIUM 500 MG/5ML IV SOLN
INTRAVENOUS | Status: DC | PRN
  Administered 2022-06-17: 250 mg via INTRAVENOUS

## 2022-06-17 MED ORDER — ROCURONIUM BROMIDE 10 MG/ML (PF) SYRINGE
PREFILLED_SYRINGE | INTRAVENOUS | Status: AC
  Filled 2022-06-17: qty 10

## 2022-06-17 MED ORDER — DEXAMETHASONE SODIUM PHOSPHATE 10 MG/ML IJ SOLN
INTRAMUSCULAR | Status: AC
  Filled 2022-06-17: qty 1

## 2022-06-17 MED ORDER — ONDANSETRON HCL 4 MG PO TABS: 4.0000 mg | ORAL_TABLET | Freq: Three times a day (TID) | ORAL | 1 refills | Status: AC | PRN

## 2022-06-17 MED ORDER — DEXAMETHASONE SODIUM PHOSPHATE 10 MG/ML IJ SOLN
INTRAMUSCULAR | Status: DC | PRN
  Administered 2022-06-17: 5 mg via INTRAVENOUS

## 2022-06-17 MED ORDER — ONDANSETRON HCL 4 MG/2ML IJ SOLN
INTRAMUSCULAR | Status: DC | PRN
  Administered 2022-06-17: 4 mg via INTRAVENOUS

## 2022-06-17 MED ORDER — KETOROLAC TROMETHAMINE 30 MG/ML IJ SOLN: 30.0000 mg | Freq: Once | INTRAMUSCULAR | Status: DC

## 2022-06-17 MED ORDER — EPINEPHRINE 1 MG/ML IJ SOLN
INTRAMUSCULAR | Status: DC | PRN
  Administered 2022-06-17: 2 mg

## 2022-06-17 MED ORDER — MIDAZOLAM HCL 5 MG/5ML IJ SOLN
INTRAMUSCULAR | Status: DC | PRN
  Administered 2022-06-17: 2 mg via INTRAVENOUS

## 2022-06-17 MED ORDER — ORAL CARE MOUTH RINSE: 15.0000 mL | Freq: Once | OROMUCOSAL | Status: AC

## 2022-06-17 MED ORDER — PROPOFOL 10 MG/ML IV BOLUS
INTRAVENOUS | Status: DC | PRN
  Administered 2022-06-17: 150 mg via INTRAVENOUS
  Administered 2022-06-17: 50 mg via INTRAVENOUS

## 2022-06-17 MED ORDER — BUPIVACAINE HCL (PF) 0.5 % IJ SOLN
INTRAMUSCULAR | Status: DC | PRN
  Administered 2022-06-17: 15 mL via PERINEURAL

## 2022-06-17 MED ORDER — ONDANSETRON HCL 4 MG/2ML IJ SOLN
INTRAMUSCULAR | Status: AC
  Filled 2022-06-17: qty 2

## 2022-06-17 MED ORDER — FENTANYL CITRATE (PF) 100 MCG/2ML IJ SOLN
INTRAMUSCULAR | Status: AC
  Filled 2022-06-17: qty 2

## 2022-06-17 MED ORDER — SODIUM CHLORIDE 0.9 % IR SOLN
Status: DC | PRN
  Administered 2022-06-17 (×4): 3000 mL

## 2022-06-17 MED ORDER — LIDOCAINE HCL (PF) 2 % IJ SOLN
INTRAMUSCULAR | Status: AC
  Filled 2022-06-17: qty 5

## 2022-06-17 MED ORDER — FENTANYL CITRATE PF 50 MCG/ML IJ SOSY: 25.0000 ug | PREFILLED_SYRINGE | INTRAMUSCULAR | Status: DC | PRN

## 2022-06-17 MED ORDER — AMISULPRIDE (ANTIEMETIC) 5 MG/2ML IV SOLN
10.0000 mg | Freq: Once | INTRAVENOUS | Status: DC | PRN
Start: 2022-06-17 — End: 2022-06-17

## 2022-06-17 SURGICAL SUPPLY — 75 items
ANCH SUT 17.9 PEEK SWLK (Orthopedic Implant) ×1 IMPLANT
ANCH SUT 2 SWLK 19.1 CLS EYLT (Anchor) ×2 IMPLANT
ANCH SUT 2.6 FBRSTK 1.7 (Anchor) ×1 IMPLANT
ANCH SUT 2.6 FBRTK 1.7 KNTLS (Anchor) ×1 IMPLANT
ANCHOR SUT FBRTK 2.6X1.7X2 (Anchor) IMPLANT
ANCHOR SWIVELOCK BIO 4.75X19.1 (Anchor) IMPLANT
BAG COUNTER SPONGE SURGICOUNT (BAG) ×2 IMPLANT
BAG SPNG CNTER NS LX DISP (BAG) ×1
BURR OVAL 8 FLU 4.0X13 (MISCELLANEOUS) ×2 IMPLANT
CANNULA 5.75X7 CRYSTAL CLEAR (CANNULA) IMPLANT
CANNULA 5.75X71 LONG (CANNULA) IMPLANT
CANNULA PASSPORT BUTTON 8X4 (CANNULA) IMPLANT
CANNULA TWIST IN 8.25X7CM (CANNULA) IMPLANT
CUTTER BONE 4.0MM X 13CM (MISCELLANEOUS) IMPLANT
DISSECTOR  3.8MM X 13CM (MISCELLANEOUS)
DISSECTOR 3.8MM X 13CM (MISCELLANEOUS) IMPLANT
DRAPE ORTHO SPLIT 77X108 STRL (DRAPES) ×2
DRAPE SHEET LG 3/4 BI-LAMINATE (DRAPES) ×2 IMPLANT
DRAPE STERI 35X30 U-POUCH (DRAPES) ×2 IMPLANT
DRAPE SURG 17X23 STRL (DRAPES) ×2 IMPLANT
DRAPE SURG ORHT 6 SPLT 77X108 (DRAPES) ×4 IMPLANT
DRAPE U-SHAPE 47X51 STRL (DRAPES) ×2 IMPLANT
DURAPREP 26ML APPLICATOR (WOUND CARE) ×2 IMPLANT
ELECT REM PT RETURN 15FT ADLT (MISCELLANEOUS) IMPLANT
FIBERSTICK 2 (SUTURE) IMPLANT
GAUZE PAD ABD 8X10 STRL (GAUZE/BANDAGES/DRESSINGS) ×6 IMPLANT
GAUZE SPONGE 4X4 12PLY STRL (GAUZE/BANDAGES/DRESSINGS) ×2 IMPLANT
GLOVE BIO SURGEON STRL SZ7.5 (GLOVE) ×4 IMPLANT
GLOVE BIOGEL PI IND STRL 8 (GLOVE) ×4 IMPLANT
GLOVE BIOGEL PI INDICATOR 8 (GLOVE) ×2
GOWN STRL REUS W/ TWL XL LVL3 (GOWN DISPOSABLE) ×4 IMPLANT
GOWN STRL REUS W/TWL XL LVL3 (GOWN DISPOSABLE) ×2
IMPL FIBERTAK KNTLS 2.6 (Anchor) IMPLANT
IMPLANT FIBERTAK KNTLS 2.6 (Anchor) ×1 IMPLANT
IV NS IRRIG 3000ML ARTHROMATIC (IV SOLUTION) ×4 IMPLANT
KIT ANCHOR FBRTK 2.6 STR (KITS) IMPLANT
KIT BASIN OR (CUSTOM PROCEDURE TRAY) ×2 IMPLANT
KIT TURNOVER KIT A (KITS) IMPLANT
MANIFOLD NEPTUNE II (INSTRUMENTS) ×2 IMPLANT
NDL 1/2 CIR CATGUT .05X1.09 (NEEDLE) IMPLANT
NDL SAFETY ECLIP 18X1.5 (MISCELLANEOUS) IMPLANT
NDL SCORPION MULTI FIRE (NEEDLE) IMPLANT
NEEDLE 1/2 CIR CATGUT .05X1.09 (NEEDLE) IMPLANT
NEEDLE SCORPION MULTI FIRE (NEEDLE) ×1 IMPLANT
PACK ARTHROSCOPY WL (CUSTOM PROCEDURE TRAY) ×2 IMPLANT
PASSER CERCLAGE STRT LRG DISP (ORTHOPEDIC DISPOSABLE SUPPLIES) IMPLANT
PASSER CERCLAGE STRT MED DISP (ORTHOPEDIC DISPOSABLE SUPPLIES) IMPLANT
PASSER CERCLAGE STRT TROC DISP (ORTHOPEDIC DISPOSABLE SUPPLIES) IMPLANT
PASSER SUT SWIFTSTITCH HIP CRT (INSTRUMENTS) IMPLANT
PEEK SWIVELOCK SHOU 3.9 (Orthopedic Implant) IMPLANT
PROBE APOLLO 90XL (SURGICAL WAND) ×2 IMPLANT
PROBE BIPOLAR ATHRO 135MM 90D (MISCELLANEOUS) IMPLANT
SLEEVE ARM SUSPENSION SYSTEM (MISCELLANEOUS) ×2 IMPLANT
SLING S3 LATERAL DISP (MISCELLANEOUS) ×2 IMPLANT
SLING ULTRA II AB L (ORTHOPEDIC SUPPLIES) IMPLANT
SLING ULTRA II L (ORTHOPEDIC SUPPLIES) IMPLANT
SPONGE T-LAP 4X18 ~~LOC~~+RFID (SPONGE) IMPLANT
SUT 2 FIBERLOOP 20 STRT BLUE (SUTURE)
SUT FIBERWIRE #2 38 T-5 BLUE (SUTURE)
SUT MNCRL AB 3-0 PS2 27 (SUTURE) ×2 IMPLANT
SUT PDS AB 0 CT1 36 (SUTURE) IMPLANT
SUT TIGER TAPE 7 IN WHITE (SUTURE) IMPLANT
SUT VIC AB 0 CT1 36 (SUTURE) IMPLANT
SUT VIC AB 2-0 CT1 27 (SUTURE)
SUT VIC AB 2-0 CT1 TAPERPNT 27 (SUTURE) IMPLANT
SUTURE 2 FIBERLOOP 20 STRT BLU (SUTURE) IMPLANT
SUTURE FIBERWR #2 38 T-5 BLUE (SUTURE) IMPLANT
SUTURE TAPE TIGERLINK 1.3MM BL (SUTURE) IMPLANT
SUTURETAPE TIGERLINK 1.3MM BL (SUTURE) ×2
SYR 27GX1/2 1ML LL SAFETY (SYRINGE) IMPLANT
TAPE CLOTH SURG 6X10 WHT LF (GAUZE/BANDAGES/DRESSINGS) ×2 IMPLANT
TOWEL OR 17X26 10 PK STRL BLUE (TOWEL DISPOSABLE) ×2 IMPLANT
TUBING ARTHROSCOPY IRRIG 16FT (MISCELLANEOUS) ×4 IMPLANT
TUBING CONNECTING 10 (TUBING) ×4 IMPLANT
WATER STERILE IRR 500ML POUR (IV SOLUTION) ×2 IMPLANT

## 2022-06-17 NOTE — Brief Op Note (Signed)
06/17/2022  9:16 AM  PATIENT:  Kyle Kennedy  51 y.o. male  PRE-OPERATIVE DIAGNOSIS:  Right shoulder rotator cuff tear  POST-OPERATIVE DIAGNOSIS:  Right shoulder rotator cuff tear  PROCEDURE:  Procedure(s) with comments: SHOULDER ARTHROSCOPY WITH ROTATOR CUFF REPAIR (Right) - 120 ARTHROSCOPIC BICEPS TENODESIS (Right) SHOULDER ARTHROSCOPY WITH SUBACROMIAL DECOMPRESSION (Right)  SURGEON:  Surgeon(s) and Role:    * Yolonda Kida, MD - Primary   ASSISTANTS: Dion Saucier, PA-C   ANESTHESIA:   regional and general  EBL:  5 cc  BLOOD ADMINISTERED:none  DRAINS: none   LOCAL MEDICATIONS USED:  NONE  SPECIMEN:  No Specimen  DISPOSITION OF SPECIMEN:  N/A  COUNTS:  YES  TOURNIQUET:  * No tourniquets in log *  DICTATION: .Note written in EPIC  PLAN OF CARE: Discharge to home after PACU  PATIENT DISPOSITION:  PACU - hemodynamically stable.   Delay start of Pharmacological VTE agent (>24hrs) due to surgical blood loss or risk of bleeding: not applicable

## 2022-06-17 NOTE — Anesthesia Procedure Notes (Signed)
Procedure Name: Intubation Date/Time: 06/17/2022 7:31 AM  Performed by: West Pugh, CRNAPre-anesthesia Checklist: Patient identified, Emergency Drugs available, Suction available, Patient being monitored and Timeout performed Patient Re-evaluated:Patient Re-evaluated prior to induction Oxygen Delivery Method: Circle system utilized Preoxygenation: Pre-oxygenation with 100% oxygen Induction Type: IV induction Ventilation: Mask ventilation without difficulty and Oral airway inserted - appropriate to patient size Laryngoscope Size: Mac and 4 Grade View: Grade I Tube type: Oral Tube size: 7.5 mm Number of attempts: 1 Airway Equipment and Method: Stylet Placement Confirmation: ETT inserted through vocal cords under direct vision, positive ETCO2, CO2 detector and breath sounds checked- equal and bilateral Secured at: 22 cm Tube secured with: Tape Dental Injury: Teeth and Oropharynx as per pre-operative assessment

## 2022-06-17 NOTE — Anesthesia Postprocedure Evaluation (Signed)
Anesthesia Post Note  Patient: Kyle Kennedy  Procedure(s) Performed: SHOULDER ARTHROSCOPY WITH ROTATOR CUFF REPAIR (Right) ARTHROSCOPIC BICEPS TENODESIS (Right) SHOULDER ARTHROSCOPY WITH SUBACROMIAL DECOMPRESSION (Right)     Patient location during evaluation: PACU Anesthesia Type: Regional and General Level of consciousness: awake Pain management: pain level controlled Vital Signs Assessment: post-procedure vital signs reviewed and stable Respiratory status: spontaneous breathing, nonlabored ventilation, respiratory function stable and patient connected to nasal cannula oxygen Cardiovascular status: blood pressure returned to baseline and stable Postop Assessment: no apparent nausea or vomiting Anesthetic complications: no   No notable events documented.  Last Vitals:  Vitals:   06/17/22 1015 06/17/22 1030  BP: (!) 122/93 130/87  Pulse: 83 77  Resp: 10 (!) 22  Temp: (!) 36.2 C   SpO2: 92% 93%    Last Pain:  Vitals:   06/17/22 1030  TempSrc:   PainSc: 0-No pain                 Danis Pembleton P Shauniece Kwan

## 2022-06-17 NOTE — Op Note (Signed)
Date of Surgery: 06/17/2022  INDICATIONS: Kyle Kennedy is a 51 y.o.-year-old male with a right shoulder pain and dysfunction related to a somewhat acute full-thickness rotator cuff tear.  Here today for arthroscopic surgery;  The patient did consent to the procedure after discussion of the risks and benefits.  DIAGNOSES: Right shoulder, acute traumatic rotator cuff tear, biceps tendinitis, subacromial impingement, and degenerative labral tearing .  POST-OPERATIVE DIAGNOSIS: same  PROCEDURE: Arthroscopic extensive debridement - 29823 Subdeltoid Bursa, Supraspinatus Tendon, Anterior Labrum, Superior Labrum, Posterior Labrum, and rotator interval Arthroscopic subacromial decompression - 16109 Arthroscopic rotator cuff repair - 60454 Arthroscopic biceps tenodesis - 09811   OPERATIVE FINDING: Exam under anesthesia: Normal Articular space: Grade II chondromalacia humeral head, degenerative labral tearing anterior, superior and posterior Chondral surfaces:  Grade 1 and 2 changes humerus and glenoid Biceps:  Flattened and synovitic Subscapularis: Intact  Supraspinatus: Complete tear  Infraspinatus: Complete tear    SURGEON: Maryan Rued, M.D.  ASSIST: Dion Saucier, PA-C.  Assistant attestation:  PA Mcclung was present for the entire procedure and was critical for all portions.  ANESTHESIA:  general, interscalene  IV FLUIDS AND URINE: See anesthesia.  ESTIMATED BLOOD LOSS: 5 mL.  IMPLANTS: 3.9 mm Arthrex swivel lock anchor x1 \\2 .6 knotless fiber tack anchor x2 for the medial row Arthrex 4.75 mm swivel lock anchor with knotless mechanism x2 for lateral row  DRAINS: None  COMPLICATIONS: None.  DESCRIPTION OF PROCEDURE: The patient was brought to the operating room and placed supine on the operating table.  The patient had been signed prior to the procedure and this was documented. The patient had the anesthesia placed by the anesthesiologist.  A time-out was performed to confirm  that this was the correct patient, site, side and location. The patient did receive antibiotics prior to the incision and was re-dosed during the procedure as needed at indicated intervals.  A tourniquet was not placed.  The patient had the operative extremity prepped and draped in the standard surgical fashion.      After obtaining informed consent the patient was brought to the operating table and underwent satisfactory anesthesia. An exam under anesthesia revealed full range of motion. He was placed in the left lateral decubitus position with an axillary roll and all bony prominences properly padded. A standard surgical timeout was performed. He was placed in 10 pounds of gentle in-line suspension.  Standard posterior and anterior superior portals were established. A diagnostic evaluation of the glenohumeral joint was performed.  There was degenerative tearing of the anterior, superior, posterior labrum.  There was inflammation and synovitis of the proximal biceps as well as significant inflammation and synovitis in the rotator interval.  There was a large tear that was obvious and apparent of the supraspinatus and anterior portion of the infraspinatus.  Teres minor was intact as well as the subscapularis tendon.  No loose bodies.  At this juncture we moved to perform arthroscopic extensive debridement.  Utilizing utilizing the motorized shaver from the mid glenoid portal and viewing from the posterior portal we debrided the anterior labrum, superior labrum, posterior labrum of all unstable tissue.  We then performed a wide debridement of the rotator interval.  We also debrided the Sub deltoid and subacromial bursa 1 in the subacromial space.  We next performed our arthroscopic biceps tenodesis.  We utilized the loop and tack method with a 3.9 mm Arthrex swivel lock anchor.  While working from the mid glenoid portal we passed a luggage tag suture around  the tendon and then pierced the tendon with a  freehand to create a locking mechanism.  We then released the tendon from the superior glenoid.  We then loaded this free tail into the 3.9 mm ankle and placed an awl punch hole just above the subscapularis tendon.  We then tenodesed the tendon at that location.  The arthroscope was inserted in the subacromial space and an additional lateral portal was established. An acromioplasty performed nicely decompressing the subacromial space with a motorized burr. Bursitis in the subacromial space was removed as well as releases were performed on the bursal surface of the rotator cuff. T  Following the subacromial decompression we then performed our rotator cuff repair.  This was a U-shaped tear that measured 2.5 cm from medial to lateral and 2.0 cm from anterior to posterior but did under score the infraspinatus a bit.  The entire supraspinatus was involved in just the anterior portion of the infraspinatus.  Intra-articular releases had previously been performed with the radiofrequency wand and the probe.  With the subacromial space we performed releases utilizing the radiofrequency wand.  The tendon had good excursion.  At this juncture we placed 2 Medial Row anchors at the articular margin.  We then biologically prepared the greater tuberosity with a bur and motorized shaver.  We then passed with fiber link all of the sutures of each medial anchor at the myotendinous junction.  The knotless mechanism was then utilized with each respective medial row anchor to perform a double pulley to reduce the medial row to the articular margin and to close the split that was associated with his U-shaped tear at the apex.  We did use a grasper to reduce the tendon while bringing the medial row down such that it was in an anatomic position.  We were happy with this and then placed our lateral row anchors with 2 fiber tapes in each anchor.  This created nice compression of our repair the had previously been reduced.  All  instrument counts were correct x2.  No noted complications.  He was awakened from general anesthetic in stable condition as well.  The arthroscope was then removed and portals closed with 4-0 Monocryl in standard fashion followed by a sterile occlusive dressing Polar Care ice sleeve and a slingshot sling. The patient was sent to recovery in stable condition and tolerated the procedure well  POSTOPERATIVE PLAN:  Kyle Kennedy will be nonweightbearing to the right upper extremity for approximately 6 weeks.  He will be in his sling with abduction pillow until he starts therapy in 2 weeks.  He will discharge home today from PACU.  We will see him back in the office in 2 weeks for wound check.

## 2022-06-17 NOTE — Anesthesia Procedure Notes (Signed)
Anesthesia Regional Block: Interscalene brachial plexus block   Pre-Anesthetic Checklist: , timeout performed,  Correct Patient, Correct Site, Correct Laterality,  Correct Procedure, Correct Position, site marked,  Risks and benefits discussed,  Surgical consent,  Pre-op evaluation,  At surgeon's request and post-op pain management  Laterality: Right  Prep: chloraprep       Needles:  Injection technique: Single-shot  Needle Type: Echogenic Stimulator Needle     Needle Length: 9cm  Needle Gauge: 21     Additional Needles:   Procedures:,,,, ultrasound used (permanent image in chart),,    Narrative:  Start time: 06/17/2022 7:00 AM End time: 06/17/2022 7:10 AM Injection made incrementally with aspirations every 5 mL.  Performed by: Personally  Anesthesiologist: Leonides Grills, MD  Additional Notes: Functioning IV was confirmed and monitors were applied.  A timeout was performed. Sterile prep, hand hygiene and sterile gloves were used. A 32mm 21ga Arrow echogenic stimulator needle was used. Negative aspiration and negative test dose prior to incremental administration of local anesthetic. The patient tolerated the procedure well.  Ultrasound guidance: relevent anatomy identified, needle position confirmed, local anesthetic spread visualized around nerve(s), vascular puncture avoided.  Image printed for medical record.

## 2022-06-17 NOTE — Transfer of Care (Signed)
Immediate Anesthesia Transfer of Care Note  Patient: ZYMIRE TURNBO  Procedure(s) Performed: SHOULDER ARTHROSCOPY WITH ROTATOR CUFF REPAIR (Right) ARTHROSCOPIC BICEPS TENODESIS (Right) SHOULDER ARTHROSCOPY WITH SUBACROMIAL DECOMPRESSION (Right)  Patient Location: PACU  Anesthesia Type:GA combined with regional for post-op pain  Level of Consciousness: awake and patient cooperative  Airway & Oxygen Therapy: Patient Spontanous Breathing and Patient connected to face mask oxygen  Post-op Assessment: Report given to RN and Post -op Vital signs reviewed and stable  Post vital signs: Reviewed and stable  Last Vitals:  Vitals Value Taken Time  BP 124/81 06/17/22 0922  Temp    Pulse 76 06/17/22 0930  Resp 9 06/17/22 0930  SpO2 92 % 06/17/22 0930  Vitals shown include unvalidated device data.  Last Pain:  Vitals:   06/17/22 0634  TempSrc: Oral  PainSc: 4       Patients Stated Pain Goal: 3 (06/17/22 5015)  Complications: No notable events documented.
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
## 2022-06-17 NOTE — Anesthesia Preprocedure Evaluation (Addendum)
Anesthesia Evaluation  Patient identified by MRN, date of birth, ID band Patient awake    Reviewed: Allergy & Precautions, NPO status , Patient's Chart, lab work & pertinent test results  Airway Mallampati: II  TM Distance: >3 FB Neck ROM: Full    Dental  (+) Upper Dentures, Lower Dentures   Pulmonary asthma , sleep apnea and Continuous Positive Airway Pressure Ventilation ,    Pulmonary exam normal        Cardiovascular hypertension, Pt. on medications Normal cardiovascular exam     Neuro/Psych PSYCHIATRIC DISORDERS Anxiety Depression Traumatic brain injury  Neuromuscular disease    GI/Hepatic Neg liver ROS, hiatal hernia, GERD  Medicated and Controlled,  Endo/Other  negative endocrine ROS  Renal/GU negative Renal ROS     Musculoskeletal  (+) Arthritis ,   Abdominal (+) + obese,   Peds  Hematology negative hematology ROS (+)   Anesthesia Other Findings Right shoulder rotator cuff tear  Reproductive/Obstetrics                            Anesthesia Physical Anesthesia Plan  ASA: 3  Anesthesia Plan: Regional and General   Post-op Pain Management:    Induction: Intravenous  PONV Risk Score and Plan: 2 and Ondansetron, Dexamethasone, Midazolam and Treatment may vary due to age or medical condition  Airway Management Planned: Oral ETT  Additional Equipment:   Intra-op Plan:   Post-operative Plan: Extubation in OR  Informed Consent: I have reviewed the patients History and Physical, chart, labs and discussed the procedure including the risks, benefits and alternatives for the proposed anesthesia with the patient or authorized representative who has indicated his/her understanding and acceptance.       Plan Discussed with: CRNA  Anesthesia Plan Comments:        Anesthesia Quick Evaluation

## 2022-06-17 NOTE — Discharge Instructions (Signed)
Orthopedic surgery discharge instructions:  -Maintain postoperative bandages for 3 days.  You may remove these bandages on post op day 3 and begin showering at that time.  Please do not submerge underwater.  -Maintain your arm in sling at all times.  You should only remove for showering and getting dressed.  No lifting with the operative arm.  -For mild to moderate pain use Tylenol and Advil in alternating fashion around-the-clock.  For breakthrough pain use oxycodone as necessary.  -Please apply ice to the right shoulder for 20-30 minutes out of each hour that you are awake.  Do this around-the-clock for the first 3 days from surgery.  -Follow-up in 2 weeks for routine postoperative check.  

## 2022-06-21 ENCOUNTER — Encounter (HOSPITAL_COMMUNITY): Payer: Self-pay | Admitting: Orthopedic Surgery

## 2022-07-01 DIAGNOSIS — M25511 Pain in right shoulder: Secondary | ICD-10-CM | POA: Diagnosis not present

## 2022-07-08 ENCOUNTER — Other Ambulatory Visit: Payer: Self-pay | Admitting: Student

## 2022-07-12 ENCOUNTER — Other Ambulatory Visit: Payer: Self-pay | Admitting: Student

## 2022-07-12 DIAGNOSIS — J302 Other seasonal allergic rhinitis: Secondary | ICD-10-CM

## 2022-07-13 DIAGNOSIS — M25511 Pain in right shoulder: Secondary | ICD-10-CM | POA: Diagnosis not present

## 2022-07-27 DIAGNOSIS — M25511 Pain in right shoulder: Secondary | ICD-10-CM | POA: Diagnosis not present

## 2022-08-02 DIAGNOSIS — M25511 Pain in right shoulder: Secondary | ICD-10-CM | POA: Diagnosis not present

## 2022-08-04 ENCOUNTER — Encounter: Payer: Self-pay | Admitting: Student

## 2022-08-05 ENCOUNTER — Other Ambulatory Visit: Payer: Self-pay | Admitting: Student

## 2022-08-05 DIAGNOSIS — M25511 Pain in right shoulder: Secondary | ICD-10-CM | POA: Diagnosis not present

## 2022-08-05 DIAGNOSIS — I10 Essential (primary) hypertension: Secondary | ICD-10-CM

## 2022-08-09 DIAGNOSIS — M25511 Pain in right shoulder: Secondary | ICD-10-CM | POA: Diagnosis not present

## 2022-08-12 DIAGNOSIS — M25511 Pain in right shoulder: Secondary | ICD-10-CM | POA: Diagnosis not present

## 2022-08-15 DIAGNOSIS — M25511 Pain in right shoulder: Secondary | ICD-10-CM | POA: Diagnosis not present

## 2022-08-19 DIAGNOSIS — M25511 Pain in right shoulder: Secondary | ICD-10-CM | POA: Diagnosis not present

## 2022-08-23 DIAGNOSIS — M25511 Pain in right shoulder: Secondary | ICD-10-CM | POA: Diagnosis not present

## 2022-08-26 ENCOUNTER — Other Ambulatory Visit: Payer: Self-pay | Admitting: Student

## 2022-08-26 DIAGNOSIS — M25511 Pain in right shoulder: Secondary | ICD-10-CM | POA: Diagnosis not present

## 2022-08-29 DIAGNOSIS — M25511 Pain in right shoulder: Secondary | ICD-10-CM | POA: Diagnosis not present

## 2022-09-01 DIAGNOSIS — M25511 Pain in right shoulder: Secondary | ICD-10-CM | POA: Diagnosis not present

## 2022-09-05 DIAGNOSIS — M25511 Pain in right shoulder: Secondary | ICD-10-CM | POA: Diagnosis not present

## 2022-09-12 DIAGNOSIS — G4733 Obstructive sleep apnea (adult) (pediatric): Secondary | ICD-10-CM | POA: Diagnosis not present

## 2022-09-12 DIAGNOSIS — M25511 Pain in right shoulder: Secondary | ICD-10-CM | POA: Diagnosis not present

## 2022-09-20 DIAGNOSIS — M25511 Pain in right shoulder: Secondary | ICD-10-CM | POA: Diagnosis not present

## 2022-09-27 DIAGNOSIS — Z96651 Presence of right artificial knee joint: Secondary | ICD-10-CM | POA: Diagnosis not present

## 2022-09-27 DIAGNOSIS — M25562 Pain in left knee: Secondary | ICD-10-CM | POA: Diagnosis not present

## 2022-09-28 DIAGNOSIS — M25511 Pain in right shoulder: Secondary | ICD-10-CM | POA: Diagnosis not present

## 2022-10-05 DIAGNOSIS — M25511 Pain in right shoulder: Secondary | ICD-10-CM | POA: Diagnosis not present

## 2022-10-07 ENCOUNTER — Other Ambulatory Visit: Payer: Self-pay | Admitting: Student

## 2022-10-07 DIAGNOSIS — I10 Essential (primary) hypertension: Secondary | ICD-10-CM

## 2022-10-12 DIAGNOSIS — M25511 Pain in right shoulder: Secondary | ICD-10-CM | POA: Diagnosis not present

## 2022-10-18 DIAGNOSIS — Z9889 Other specified postprocedural states: Secondary | ICD-10-CM | POA: Diagnosis not present

## 2022-10-29 ENCOUNTER — Other Ambulatory Visit: Payer: Self-pay | Admitting: Student

## 2022-10-29 DIAGNOSIS — E8881 Metabolic syndrome: Secondary | ICD-10-CM

## 2022-10-29 DIAGNOSIS — I10 Essential (primary) hypertension: Secondary | ICD-10-CM

## 2022-10-29 DIAGNOSIS — R7303 Prediabetes: Secondary | ICD-10-CM

## 2022-10-29 DIAGNOSIS — E785 Hyperlipidemia, unspecified: Secondary | ICD-10-CM

## 2022-10-31 NOTE — Telephone Encounter (Signed)
If tolerating, can increase dosage at next refill.

## 2022-11-10 ENCOUNTER — Encounter: Payer: Self-pay | Admitting: Student

## 2022-11-17 ENCOUNTER — Telehealth: Payer: Self-pay

## 2022-11-17 ENCOUNTER — Other Ambulatory Visit (HOSPITAL_COMMUNITY): Payer: Self-pay

## 2022-11-17 ENCOUNTER — Encounter: Payer: Self-pay | Admitting: Student

## 2022-11-17 NOTE — Telephone Encounter (Signed)
A Prior Authorization was initiated for this patients OZEMPIC through CoverMyMeds.   Key: [id number]

## 2022-11-17 NOTE — Telephone Encounter (Signed)
Prior Auth for patients medication OZEMPIC denied by [facility] via CoverMyMeds.   Reason: Drugs when used for anorexia, weight loss, or weight gain are excluded from coverage under Medicare rules.  CoverMyMeds Key: [id number]

## 2022-11-21 MED ORDER — BYDUREON 2 MG ~~LOC~~ PEN
2.0000 mg | PEN_INJECTOR | SUBCUTANEOUS | 3 refills | Status: DC
Start: 2022-11-21 — End: 2023-03-15

## 2022-12-01 ENCOUNTER — Other Ambulatory Visit (HOSPITAL_COMMUNITY): Payer: Self-pay

## 2022-12-01 ENCOUNTER — Telehealth: Payer: Self-pay

## 2022-12-01 NOTE — Telephone Encounter (Signed)
A Prior Authorization was initiated for this patients BYDUREON ER through CoverMyMeds.   Key: [id number]

## 2022-12-02 NOTE — Telephone Encounter (Signed)
Prior Auth for patients medication BYDUREON ER denied by [facility] via CoverMyMeds.   Reason: Bydureon Bc Inj 2/0.85ml is not FDA approved for your medical condition(s): Prediabetes. These condition(s) are not supported by one of the accepted references. Therefore, your drug is denied because it is not being used for a "medically accepted indication.  CoverMyMeds Key: [id number]

## 2022-12-05 ENCOUNTER — Encounter: Payer: Self-pay | Admitting: Student

## 2022-12-11 ENCOUNTER — Other Ambulatory Visit: Payer: Self-pay | Admitting: Student

## 2022-12-11 DIAGNOSIS — G4733 Obstructive sleep apnea (adult) (pediatric): Secondary | ICD-10-CM | POA: Diagnosis not present

## 2022-12-11 DIAGNOSIS — F32A Depression, unspecified: Secondary | ICD-10-CM

## 2022-12-29 ENCOUNTER — Other Ambulatory Visit: Payer: Self-pay | Admitting: Student

## 2022-12-29 DIAGNOSIS — F3341 Major depressive disorder, recurrent, in partial remission: Secondary | ICD-10-CM

## 2023-01-06 ENCOUNTER — Other Ambulatory Visit: Payer: Self-pay | Admitting: Student

## 2023-01-06 DIAGNOSIS — J302 Other seasonal allergic rhinitis: Secondary | ICD-10-CM

## 2023-01-16 DIAGNOSIS — G4733 Obstructive sleep apnea (adult) (pediatric): Secondary | ICD-10-CM | POA: Diagnosis not present

## 2023-01-18 ENCOUNTER — Telehealth: Payer: Self-pay

## 2023-01-18 NOTE — Telephone Encounter (Signed)
Kyle Goodell, NP with [facility] calls nurse line to report elevated PHQ-9. She states that patient had score of 6 at visit last week, with positive response on question 9.  States that patient did not report positive SI or HI on day of visit.   Called patient to check in. He reports that this is his baseline. No current thoughts of harm, SI or HI. States that "self termination is not an option."  Advised that we should schedule him an appointment for follow up.   Scheduled for this Friday morning with Dr. Zigmund Daniel.   Red flags discussed.   Talbot Grumbling, RN

## 2023-01-20 ENCOUNTER — Encounter: Payer: Self-pay | Admitting: Student

## 2023-01-20 ENCOUNTER — Ambulatory Visit (INDEPENDENT_AMBULATORY_CARE_PROVIDER_SITE_OTHER): Payer: 59 | Admitting: Student

## 2023-01-20 VITALS — BP 131/80 | HR 70 | Ht 67.0 in | Wt 258.6 lb

## 2023-01-20 DIAGNOSIS — F3341 Major depressive disorder, recurrent, in partial remission: Secondary | ICD-10-CM | POA: Diagnosis not present

## 2023-01-20 DIAGNOSIS — N528 Other male erectile dysfunction: Secondary | ICD-10-CM | POA: Diagnosis not present

## 2023-01-20 MED ORDER — SILDENAFIL CITRATE 100 MG PO TABS: 50.0000 mg | ORAL_TABLET | Freq: Every day | ORAL | 11 refills | Status: AC | PRN

## 2023-01-20 NOTE — Assessment & Plan Note (Signed)
Viagra 50-100 PRN, discussed with patient. STOP online medications. Return as needed

## 2023-01-20 NOTE — Patient Instructions (Addendum)
It was great to see you today! Thank you for choosing [facility] for your primary care. Kyle Kennedy was seen for follow up.  Today we addressed: Continue with your current medication regimen Blood pressure was slightly elevated Take viagra 50 mg then increase to 100 as needed   If you haven't already, sign up for My Chart to have easy access to your labs results, and communication with your primary care physician.  I recommend that you always bring your medications to each appointment as this makes it easy to ensure you are on the correct medications and helps [place] not miss refills when you need them. Call the clinic at [phone] if your symptoms worsen or you have any concerns.  You should return to our clinic Return if symptoms worsen or fail to improve. Please arrive 15 minutes before your appointment to ensure smooth check in process.  We appreciate your efforts in making this happen.  Thank you for allowing me to participate in your care, Kyle Martinez, MD 01/20/2023, 10:47 AM PGY-2, [facility]

## 2023-01-20 NOTE — Assessment & Plan Note (Signed)
Wellbutrin and Effexor, discussed counseling/marital counseling. Passively suicidal chronically but not actively suicidal now. Patient doing well on medications, discussed alternative safety measures, support system and counseling.

## 2023-01-20 NOTE — Progress Notes (Signed)
  SUBJECTIVE:   CHIEF COMPLAINT / HPI:      01/20/2023   10:31 AM 05/05/2022    8:55 AM 04/04/2022    9:45 AM  PHQ9 SCORE ONLY  PHQ-9 Total Score 5 3 3    Depression:  Patient presenting today because of concern for PHQ-9 that was obtained at an urgent care.  He reported a positive response to question 9 at that time.  Patient has chronic depression and is generally  passively suicidal.  The patient reports that "self termination is not an option."  He also notes that he is working on his hot tub to change it to salt water, is having difficulty in his marriage.  He reports that he and his wife do not see eye to eye when it comes to sexual intercourse.  This causes him to be somewhat frustrated.  The patient reports that he also has tried to make changes in the relationship to help improve the marriage.  However, none of these have really stuck.  He notes that he was promised many things before marriage that ultimately did not come true.  He has a longstanding history of depression and feels like it is well-controlled on his current medication regimen.  He does not feel suicidal or homicidal at this time.  Erectile dysfunction The patient is requesting Viagra for assistance with erections.  He reports that he takes the medication online that is $80 and is very expensive for him.  Patient reports that he does not have morning erections often. No nitrates, no smoking.   PERTINENT  PMH / PSH:   GERD, depression, obesity, prediabetes, HTN   Patient Care Team: [name], MD as PCP - General (Family Medicine) OBJECTIVE:  BP 131/80   Pulse 70   Ht 5\' 7"  (1.702 m)   Wt 258 lb 9.6 oz (117.3 kg)   SpO2 100%   BMI 40.50 kg/m  Physical Exam  General: Alert and oriented in no apparent distress Heart: Regular rate and rhythm with no murmurs appreciated Lungs: normal WOB Abdomen: no abdominal pain Skin: Warm and dry  ASSESSMENT/PLAN:  Other male erectile dysfunction Assessment &  Plan: Viagra 50-100 PRN, discussed with patient. STOP online medications. Return as needed  Orders: -     Sildenafil Citrate; Take 0.5-1 tablets (50-100 mg total) by mouth daily as needed for erectile dysfunction. Start with 50 mg and if you need more, can increase to 100  Dispense: 5 tablet; Refill: 11  Recurrent major depressive disorder, in partial remission Assessment & Plan: Wellbutrin and Effexor, discussed counseling/marital counseling. Passively suicidal chronically but not actively suicidal now. Patient doing well on medications, discussed alternative safety measures, support system and counseling.     Return if symptoms worsen or fail to improve. [name], MD 01/20/2023, 12:52 PM PGY-2, [facility]

## 2023-01-22 ENCOUNTER — Other Ambulatory Visit: Payer: Self-pay | Admitting: Student

## 2023-01-22 DIAGNOSIS — I10 Essential (primary) hypertension: Secondary | ICD-10-CM

## 2023-01-30 ENCOUNTER — Encounter: Payer: Self-pay | Admitting: Student

## 2023-02-01 ENCOUNTER — Encounter: Payer: Self-pay | Admitting: Student

## 2023-02-02 ENCOUNTER — Other Ambulatory Visit (HOSPITAL_COMMUNITY): Payer: Self-pay

## 2023-02-02 ENCOUNTER — Telehealth: Payer: Self-pay

## 2023-02-02 MED ORDER — RYBELSUS 3 MG PO TABS: 3.0000 mg | ORAL_TABLET | Freq: Every day | ORAL | 0 refills | Status: AC

## 2023-02-02 NOTE — Telephone Encounter (Signed)
A Prior Authorization was initiated for this patients RYBELSUS through CoverMyMeds.   Key: [id number]

## 2023-02-03 ENCOUNTER — Other Ambulatory Visit: Payer: Self-pay | Admitting: Student

## 2023-02-03 ENCOUNTER — Encounter: Payer: Self-pay | Admitting: Student

## 2023-02-03 DIAGNOSIS — E8881 Metabolic syndrome: Secondary | ICD-10-CM

## 2023-02-03 DIAGNOSIS — E88819 Insulin resistance, unspecified: Secondary | ICD-10-CM

## 2023-02-03 MED ORDER — METFORMIN HCL ER 500 MG PO TB24: 500.0000 mg | ORAL_TABLET | Freq: Every day | ORAL | 0 refills | Status: DC

## 2023-02-03 NOTE — Telephone Encounter (Signed)
Prior Auth for patients medication RYBELSUS denied by [facility] via CoverMyMeds.   Reason: Drugs when used for anorexia, weight loss, or weight gain are excluded from coverage under medicare rules.  CoverMyMeds Key: [id number]

## 2023-02-09 ENCOUNTER — Encounter: Payer: Self-pay | Admitting: Student

## 2023-02-13 ENCOUNTER — Encounter: Payer: Self-pay | Admitting: Student

## 2023-02-21 ENCOUNTER — Encounter: Payer: Self-pay | Admitting: Student

## 2023-03-02 ENCOUNTER — Telehealth: Payer: Self-pay | Admitting: Student

## 2023-03-02 NOTE — Telephone Encounter (Signed)
Contacted Kyle Kennedy to schedule their annual wellness visit. Appointment made for 03/06/2023.  Thank you,  [facility] Support [facility] Direct dial  [phone]

## 2023-03-06 ENCOUNTER — Ambulatory Visit (INDEPENDENT_AMBULATORY_CARE_PROVIDER_SITE_OTHER): Payer: 59

## 2023-03-06 VITALS — Ht 67.0 in | Wt 258.0 lb

## 2023-03-06 DIAGNOSIS — Z0000 Encounter for general adult medical examination without abnormal findings: Secondary | ICD-10-CM | POA: Diagnosis not present

## 2023-03-06 NOTE — Patient Instructions (Signed)
Kyle Kennedy , Thank you for taking time to come for your Medicare Wellness Visit. I appreciate your ongoing commitment to your health goals. Please review the following plan we discussed and let me know if I can assist you in the future.   These are the goals we discussed:  Goals      Weight (lb) < 200 lb (90.7 kg)        This is a list of the screening recommended for you and due dates:  Health Maintenance  Topic Date Due   COVID-19 Vaccine (6 - 2023-24 season) 06/17/2022   Medicare Annual Wellness Visit  12/22/2022   Flu Shot  05/18/2023   DTaP/Tdap/Td vaccine (2 - Td or Tdap) 08/08/2023   Colon Cancer Screening  12/22/2029   Hepatitis C Screening: USPSTF Recommendation to screen - Ages 18-79 yo.  Completed   HIV Screening  Completed   Zoster (Shingles) Vaccine  Completed   HPV Vaccine  Aged Out    Advanced directives: Information on Advanced Care Planning can be found at [facility] of [place] Advance Health Care Directives Advance Health Care Directives ([email or web address]    Conditions/risks identified: Aim for 30 minutes of exercise or brisk walking, 6-8 glasses of water, and 5 servings of fruits and vegetables each day.   Next appointment: Follow up in one year for your annual wellness visit   Preventive Care 40-64 Years, Male Preventive care refers to lifestyle choices and visits with your health care provider that can promote health and wellness. What does preventive care include? A yearly physical exam. This is also called an annual well check. Dental exams once or twice a year. Routine eye exams. Ask your health care provider how often you should have your eyes checked. Personal lifestyle choices, including: Daily care of your teeth and gums. Regular physical activity. Eating a healthy diet. Avoiding tobacco and drug use. Limiting alcohol use. Practicing safe sex. Taking low-dose aspirin every day starting at age 58. What happens during an annual well  check? The services and screenings done by your health care provider during your annual well check will depend on your age, overall health, lifestyle risk factors, and family history of disease. Counseling  Your health care provider may ask you questions about your: Alcohol use. Tobacco use. Drug use. Emotional well-being. Home and relationship well-being. Sexual activity. Eating habits. Work and work Astron[employer]. Screening  You may have the following tests or measurements: Height, weight, and BMI. Blood pressure. Lipid and cholesterol levels. These may be checked every 5 years, or more frequently if you are over 30 years old. Skin check. Lung cancer screening. You may have this screening every year starting at age 58 if you have a 30-pack-year history of smoking and currently smoke or have quit within the past 15 years. Fecal occult blood test (FOBT) of the stool. You may have this test every year starting at age 50. Flexible sigmoidoscopy or colonoscopy. You may have a sigmoidoscopy every 5 years or a colonoscopy every 10 years starting at age 22. Prostate cancer screening. Recommendations will vary depending on your family history and other risks. Hepatitis C blood test. Hepatitis B blood test. Sexually transmitted disease (STD) testing. Diabetes screening. This is done by checking your blood sugar (glucose) after you have not eaten for a while (fasting). You may have this done every 1-3 years. Discuss your test results, treatment options, and if necessary, the need for more tests with your health care provider. Vaccines  Your  health care provider may recommend certain vaccines, such as: Influenza vaccine. This is recommended every year. Tetanus, diphtheria, and acellular pertussis (Tdap, Td) vaccine. You may need a Td booster every 10 years. Zoster vaccine. You may need this after age 48. Pneumococcal 13-valent conjugate (PCV13) vaccine. You may need this if you have certain  conditions and have not been vaccinated. Pneumococcal polysaccharide (PPSV23) vaccine. You may need one or two doses if you smoke cigarettes or if you have certain conditions. Talk to your health care provider about which screenings and vaccines you need and how often you need them. This information is not intended to replace advice given to you by your health care provider. Make sure you discuss any questions you have with your health care provider. Document Released: 10/30/2015 Document Revised: 06/22/2016 Document Reviewed: 08/04/2015 Elsevier Interactive Patient Education  2017 [employer].  Fall Prevention in the Home Falls can cause injuries. They can happen to people of all ages. There are many things you can do to make your home safe and to help prevent falls. What can I do on the outside of my home? Regularly fix the edges of walkways and driveways and fix any cracks. Remove anything that might make you trip as you walk through a door, such as a raised step or threshold. Trim any bushes or trees on the path to your home. Use bright outdoor lighting. Clear any walking paths of anything that might make someone trip, such as rocks or tools. Regularly check to see if handrails are loose or broken. Make sure that both sides of any steps have handrails. Any raised decks and porches should have guardrails on the edges. Have any leaves, snow, or ice cleared regularly. Use sand or salt on walking paths during winter. Clean up any spills in your garage right away. This includes oil or grease spills. What can I do in the bathroom? Use night lights. Install grab bars by the toilet and in the tub and shower. Do not use towel bars as grab bars. Use non-skid mats or decals in the tub or shower. If you need to sit down in the shower, use a plastic, non-slip stool. Keep the floor dry. Clean up any water that spills on the floor as soon as it happens. Remove soap buildup in the tub or shower  regularly. Attach bath mats securely with double-sided non-slip rug tape. Do not have throw rugs and other things on the floor that can make you trip. What can I do in the bedroom? Use night lights. Make sure that you have a light by your bed that is easy to reach. Do not use any sheets or blankets that are too big for your bed. They should not hang down onto the floor. Have a firm chair that has side arms. You can use this for support while you get dressed. Do not have throw rugs and other things on the floor that can make you trip. What can I do in the kitchen? Clean up any spills right away. Avoid walking on wet floors. Keep items that you use a lot in easy-to-reach places. If you need to reach something above you, use a strong step stool that has a grab bar. Keep electrical cords out of the way. Do not use floor polish or wax that makes floors slippery. If you must use wax, use non-skid floor wax. Do not have throw rugs and other things on the floor that can make you trip. What can I do  with my stairs? Do not leave any items on the stairs. Make sure that there are handrails on both sides of the stairs and use them. Fix handrails that are broken or loose. Make sure that handrails are as long as the stairways. Check any carpeting to make sure that it is firmly attached to the stairs. Fix any carpet that is loose or worn. Avoid having throw rugs at the top or bottom of the stairs. If you do have throw rugs, attach them to the floor with carpet tape. Make sure that you have a light switch at the top of the stairs and the bottom of the stairs. If you do not have them, ask someone to add them for you. What else can I do to help prevent falls? Wear shoes that: Do not have high heels. Have rubber bottoms. Are comfortable and fit you well. Are closed at the toe. Do not wear sandals. If you use a stepladder: Make sure that it is fully opened. Do not climb a closed stepladder. Make sure that  both sides of the stepladder are locked into place. Ask someone to hold it for you, if possible. Clearly mark and make sure that you can see: Any grab bars or handrails. First and last steps. Where the edge of each step is. Use tools that help you move around (mobility aids) if they are needed. These include: Canes. Walkers. Scooters. Crutches. Turn on the lights when you go into a dark area. Replace any light bulbs as soon as they burn out. Set up your furniture so you have a clear path. Avoid moving your furniture around. If any of your floors are uneven, fix them. If there are any pets around you, be aware of where they are. Review your medicines with your doctor. Some medicines can make you feel dizzy. This can increase your chance of falling. Ask your doctor what other things that you can do to help prevent falls. This information is not intended to replace advice given to you by your health care provider. Make sure you discuss any questions you have with your health care provider. Document Released: 07/30/2009 Document Revised: 03/10/2016 Document Reviewed: 11/07/2014 Elsevier Interactive Patient Education  2017 [employer].
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
## 2023-03-11 DIAGNOSIS — G4733 Obstructive sleep apnea (adult) (pediatric): Secondary | ICD-10-CM | POA: Diagnosis not present

## 2023-03-12 ENCOUNTER — Encounter: Payer: Self-pay | Admitting: Student

## 2023-03-14 DIAGNOSIS — G4733 Obstructive sleep apnea (adult) (pediatric): Secondary | ICD-10-CM | POA: Diagnosis not present

## 2023-03-15 ENCOUNTER — Other Ambulatory Visit: Payer: Self-pay | Admitting: Student

## 2023-03-15 MED ORDER — RYBELSUS 7 MG PO TABS: 7.0000 mg | ORAL_TABLET | Freq: Every day | ORAL | 0 refills | Status: DC

## 2023-03-26 ENCOUNTER — Encounter: Payer: Self-pay | Admitting: Student

## 2023-04-03 ENCOUNTER — Other Ambulatory Visit: Payer: Self-pay | Admitting: Student

## 2023-04-03 DIAGNOSIS — I10 Essential (primary) hypertension: Secondary | ICD-10-CM

## 2023-04-10 ENCOUNTER — Encounter: Payer: Self-pay | Admitting: Student

## 2023-04-11 MED ORDER — RYBELSUS 7 MG PO TABS: 14.0000 mg | ORAL_TABLET | Freq: Every day | ORAL | 2 refills | Status: DC

## 2023-04-14 ENCOUNTER — Other Ambulatory Visit: Payer: Self-pay

## 2023-04-15 ENCOUNTER — Encounter: Payer: Self-pay | Admitting: Student

## 2023-04-17 ENCOUNTER — Other Ambulatory Visit: Payer: Self-pay | Admitting: Student

## 2023-04-17 ENCOUNTER — Encounter: Payer: Self-pay | Admitting: Student

## 2023-04-17 DIAGNOSIS — G4733 Obstructive sleep apnea (adult) (pediatric): Secondary | ICD-10-CM | POA: Diagnosis not present

## 2023-04-19 MED ORDER — RYBELSUS 14 MG PO TABS: 14.0000 mg | ORAL_TABLET | Freq: Every day | ORAL | 1 refills | Status: DC

## 2023-05-02 ENCOUNTER — Other Ambulatory Visit: Payer: Self-pay | Admitting: Student

## 2023-05-02 DIAGNOSIS — E88819 Insulin resistance, unspecified: Secondary | ICD-10-CM

## 2023-05-02 DIAGNOSIS — E8881 Metabolic syndrome: Secondary | ICD-10-CM

## 2023-05-17 ENCOUNTER — Encounter: Payer: Self-pay | Admitting: Student

## 2023-05-24 ENCOUNTER — Encounter: Payer: Self-pay | Admitting: Student

## 2023-05-24 ENCOUNTER — Other Ambulatory Visit: Payer: Self-pay | Admitting: Student

## 2023-05-24 MED ORDER — RYBELSUS 14 MG PO TABS: 14.0000 mg | ORAL_TABLET | Freq: Every day | ORAL | 1 refills | Status: DC

## 2023-05-25 ENCOUNTER — Other Ambulatory Visit: Payer: Self-pay | Admitting: Student

## 2023-06-14 DIAGNOSIS — G4733 Obstructive sleep apnea (adult) (pediatric): Secondary | ICD-10-CM | POA: Diagnosis not present

## 2023-06-15 ENCOUNTER — Other Ambulatory Visit: Payer: Self-pay | Admitting: Student

## 2023-06-22 ENCOUNTER — Other Ambulatory Visit: Payer: Self-pay | Admitting: Student

## 2023-06-22 DIAGNOSIS — F3341 Major depressive disorder, recurrent, in partial remission: Secondary | ICD-10-CM

## 2023-06-26 ENCOUNTER — Other Ambulatory Visit: Payer: Self-pay | Admitting: Student

## 2023-06-26 DIAGNOSIS — J302 Other seasonal allergic rhinitis: Secondary | ICD-10-CM

## 2023-07-25 ENCOUNTER — Other Ambulatory Visit: Payer: Self-pay | Admitting: Student

## 2023-07-25 DIAGNOSIS — E8881 Metabolic syndrome: Secondary | ICD-10-CM

## 2023-07-25 DIAGNOSIS — I10 Essential (primary) hypertension: Secondary | ICD-10-CM

## 2023-07-25 DIAGNOSIS — E88819 Insulin resistance, unspecified: Secondary | ICD-10-CM

## 2023-07-26 ENCOUNTER — Ambulatory Visit: Payer: 59 | Admitting: Primary Care

## 2023-07-26 ENCOUNTER — Encounter: Payer: Self-pay | Admitting: Primary Care

## 2023-07-26 ENCOUNTER — Ambulatory Visit: Payer: 59

## 2023-07-26 VITALS — BP 136/80 | HR 79 | Ht 69.0 in | Wt 259.2 lb

## 2023-07-26 DIAGNOSIS — R0600 Dyspnea, unspecified: Secondary | ICD-10-CM | POA: Diagnosis not present

## 2023-07-26 DIAGNOSIS — G4733 Obstructive sleep apnea (adult) (pediatric): Secondary | ICD-10-CM | POA: Diagnosis not present

## 2023-07-26 DIAGNOSIS — R0609 Other forms of dyspnea: Secondary | ICD-10-CM | POA: Diagnosis not present

## 2023-07-26 DIAGNOSIS — R0602 Shortness of breath: Secondary | ICD-10-CM

## 2023-07-26 MED ORDER — ALBUTEROL SULFATE HFA 108 (90 BASE) MCG/ACT IN AERS: 2.0000 | INHALATION_SPRAY | Freq: Four times a day (QID) | RESPIRATORY_TRACT | 0 refills | Status: DC | PRN

## 2023-07-26 NOTE — Patient Instructions (Addendum)
Sleep apnea is well-controlled on current pressure settings 5-15cm h20 You are having no significant residual apneas when wearing your CPAP No changes recommended today Will renew supplies with [facility]  Recommendations Continue to wear CPAP nightly 4 to 6 hours or longer Focus on side sleeping position Continue to work on weight loss efforts as able Avoid alcohol or sedatives prior to bedtime as these can worsen underlying obstructive sleep apnea Do not drive if tired  Orders: Renew CPAP supplies with [facility] for 1 year   Follow-up 1 year with [facility] NP/ virtual visit ok   CPAP and BIPAP Information CPAP and BIPAP are methods that use air pressure to keep your airways open and to help you breathe well. CPAP and BIPAP use different amounts of pressure. Your health care provider will tell you whether CPAP or BIPAP would be more helpful for you. CPAP stands for "continuous positive airway pressure." With CPAP, the amount of pressure stays the same while you breathe in (inhale) and out (exhale). BIPAP stands for "bi-level positive airway pressure." With BIPAP, the amount of pressure will be higher when you inhale and lower when you exhale. This allows you to take larger breaths. CPAP or BIPAP may be used in the hospital, or your health care provider may want you to use it at home. You may need to have a sleep study before your health care provider can order a machine for you to use at home. What are the advantages? CPAP or BIPAP can be helpful if you have: Sleep apnea. Chronic obstructive pulmonary disease (COPD). Heart failure. Medical conditions that cause muscle weakness, including muscular dystrophy or amyotrophic lateral sclerosis (ALS). Other problems that cause breathing to be shallow, weak, abnormal, or difficult. CPAP and BIPAP are most commonly used for obstructive sleep apnea (OSA) to keep the airways from collapsing when the muscles relax during sleep. What are the risks? Generally,  this is a safe treatment. However, problems may occur, including: Irritated skin or skin sores if the mask does not fit properly. Dry or stuffy nose or nosebleeds. Dry mouth. Feeling gassy or bloated. Sinus or lung infection if the equipment is not cleaned properly. When should CPAP or BIPAP be used? In most cases, the mask only needs to be worn during sleep. Generally, the mask needs to be worn throughout the night and during any daytime naps. People with certain medical conditions may also need to wear the mask at other times, such as when they are awake. Follow instructions from your health care provider about when to use the machine. What happens during CPAP or BIPAP?  Both CPAP and BIPAP are provided by a small machine with a flexible plastic tube that attaches to a plastic mask that you wear. Air is blown through the mask into your nose or mouth. The amount of pressure that is used to blow the air can be adjusted on the machine. Your health care provider will set the pressure setting and help you find the best mask for you. Tips for using the mask Because the mask needs to be snug, some people feel trapped or closed-in (claustrophobic) when first using the mask. If you feel this way, you may need to get used to the mask. One way to do this is to hold the mask loosely over your nose or mouth and then gradually apply the mask more snugly. You can also gradually increase the amount of time that you use the mask. Masks are available in various types and  sizes. If your mask does not fit well, talk with your health care provider about getting a different one. Some common types of masks include: Full face masks, which fit over the mouth and nose. Nasal masks, which fit over the nose. Nasal pillow or prong masks, which fit into the nostrils. If you are using a mask that fits over your nose and you tend to breathe through your mouth, a chin strap may be applied to help keep your mouth closed. Use a  skin barrier to protect your skin as told by your health care provider. Some CPAP and BIPAP machines have alarms that may sound if the mask comes off or develops a leak. If you have trouble with the mask, it is very important that you talk with your health care provider about finding a way to make the mask easier to tolerate. Do not stop using the mask. There could be a negative impact on your health if you stop using the mask. Tips for using the machine Place your CPAP or BIPAP machine on a secure table or stand near an electrical outlet. Know where the on/off switch is on the machine. Follow instructions from your health care provider about how to set the pressure on your machine and when you should use it. Do not eat or drink while the CPAP or BIPAP machine is on. Food or fluids could get pushed into your lungs by the pressure of the CPAP or BIPAP. For home use, CPAP and BIPAP machines can be rented or purchased through home health care companies. Many different brands of machines are available. Renting a machine before purchasing may help you find out which particular machine works well for you. Your health insurance company may also decide which machine you may get. Keep the CPAP or BIPAP machine and attachments clean. Ask your health care provider for specific instructions. Check the humidifier if you have a dry stuffy nose or nosebleeds. Make sure it is working correctly. Follow these instructions at home: Take over-the-counter and prescription medicines only as told by your health care provider. Ask if you can take sinus medicine if your sinuses are blocked. Do not use any products that contain nicotine or tobacco. These products include cigarettes, chewing tobacco, and vaping devices, such as e-cigarettes. If you need help quitting, ask your health care provider. Keep all follow-up visits. This is important. Contact a health care provider if: You have redness or pressure sores on your head,  face, mouth, or nose from the mask or head gear. You have trouble using the CPAP or BIPAP machine. You cannot tolerate wearing the CPAP or BIPAP mask. Someone tells you that you snore even when wearing your CPAP or BIPAP. Get help right away if: You have trouble breathing. You feel confused. Summary CPAP and BIPAP are methods that use air pressure to keep your airways open and to help you breathe well. If you have trouble with the mask, it is very important that you talk with your health care provider about finding a way to make the mask easier to tolerate. Do not stop using the mask. There could be a negative impact to your health if you stop using the mask. Follow instructions from your health care provider about when to use the machine. This information is not intended to replace advice given to you by your health care provider. Make sure you discuss any questions you have with your health care provider. Document Revised: 05/12/2021 Document Reviewed: 09/11/2020 Elsevier Patient Education  2023 [employer]vier Inc[employer]

## 2023-07-26 NOTE — Assessment & Plan Note (Signed)
-   Former marijuana smoker. Reports intermittent dyspnea with moderate exertion for the last several years. Most likely due to obesity/ recent weight gain. We will get a baseline CXR and send in Albuterol hfa to use every 6 hours as needed for shortness of breath. If not better, recommend getting pulmonary function testing to assess for obstructive lung disease

## 2023-07-26 NOTE — Assessment & Plan Note (Signed)
-   Well controlled on auto CPAP. Current pressure 5-15cm h20; Residual AHI 0.2/hour.  - Patient is 87% compliant with use >4 hours last 30 days  - No changes recommended, renew CPAP supplies with [facility] - Encourage patient continue to wear CPAP nightly 4-6 hours or longer and work on weight loss efforts - Fu in 1 year or sooner if needed

## 2023-07-26 NOTE — Progress Notes (Signed)
@Patient  ID: Kyle Kennedy, male    DOB: 1971-07-15, 52 y.o.   MRN: [id number]  Chief Complaint  Patient presents with   Follow-up    Referring provider: [name], MD  HPI: 52 year old male, former light smoker (marijuana).  Past medical history significant for OSA.  Patient of Dr. Vassie Loll.  Previous [facility]:  06/02/2022 Patient presents today to reestablish care for OSA. Patient is sleeping well at night.Compliant with CPAP use. Current pressure settings 5 to 15 cm H2O without residual apneas. No issues with mask fit or pressure setting. He uses full face mask. He needs supplies renewed through [facility].   Airview download 05/02/2022 - 05/31/2022 Usage days 30/30 days (100%); 30 days (100%) greater than 4 hours Average usage 9 hours 4 minutes Pressure 5-15 (11.6cm h20-95%) Air leaks 23.1L/min AHI 0.2   Sleep questionnaire Symptoms-  Hx sleep apnea Prior sleep study- June Bedtime- 9pm-11pm Time to fall asleep- varies  Nocturnal awakenings- 1-3 times Out of bed/start of day- 5:50am Weight changes- NA Do you operate heavy machinery- No Do you currently wear CPAP- Yes Do you current wear oxygen- No Epworth- 4   07/26/2023- Interim hx  Patient presents today for 1 year follow-up OSA.  Patient was last seen in August 2023 to reestablish care for OSA.  He remains compliant with CPAP.  Current pressure 5 to 15 cm H2O. Tolerating PAP therapy without any major issues. He does report improvement in sleep quality when wearing. He does not sleep in one position throughout the night. Once in awhile tubing will get tangled up. He gets CPAP supplies through [facility], needs order renewed.   He is dealing with seasonal allergies. Taking over the antihistamine.  He reports shortness of breath with activity off and on for the last several years He has gained weight since coming off ozempic  He has not had a CXR in several years  No active cough or sputum production   Airview  download 06/26/2023 - 07/25/2023 Usage days 26/30 days (87%) greater than 4 hours Average usage 8 hours 9 minutes Pressure 5 to 15 cm H2O (12.3 cm H2O-95%) Air leaks 23.8 L/min (95%) AHI 0.2   No Known Allergies  Immunization History  Administered Date(s) Administered   Influenza, Quadrivalent, Recombinant, Inj, Pf 07/09/2019   Influenza,inj,Quad PF,6+ Mos 08/07/2013, 08/04/2017, 07/17/2018, 07/09/2021, 07/26/2022   Influenza-Unspecified 07/18/2015   MODERNA COVID-19 SARS-COV-2 PEDS BIVALENT BOOSTER 56yr-42yr 07/30/2021   Moderna Sars-Covid-2 Vaccination 12/19/2019, 01/16/2020, 07/11/2020, 01/25/2021   Tdap 08/07/2013   Zoster Recombinant(Shingrix) 08/14/2020, 10/30/2020    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     allopurinol (ZYLOPRIM) 100 MG tablet TAKE 1 TABLET BY MOUTH DAILY 90 tablet 3   amLODipine (NORVASC) 10 MG tablet TAKE 1 TABLET(10 MG) BY MOUTH DAILY 90 tablet 1   buPROPion (WELLBUTRIN XL) 150 MG 24 hr tablet TAKE 1 TABLET(150 MG) BY MOUTH DAILY 90  tablet 1   esomeprazole (NEXIUM) 40 MG capsule TAKE 1 CAPSULE(40 MG) BY MOUTH TWICE DAILY 180 capsule 0   fluticasone (FLONASE) 50 MCG/ACT nasal spray SHAKE LIQUID AND USE 2 SPRAYS IN EACH NOSTRIL DAILY AS NEEDED FOR ALLERGIES OR RHINITIS 48 g 1   gabapentin (NEURONTIN) 600 MG tablet TAKE 1 AND 1/2 TABLETS(900 MG) BY MOUTH DAILY AS NEEDED 90 tablet 3   levocetirizine (XYZAL) 5 MG tablet TAKE 1 TABLET(5 MG) BY MOUTH EVERY EVENING (Patient taking differently: Take 5 mg by mouth daily.) 90 tablet 3   lisinopril (ZESTRIL) 20 MG tablet TAKE 1 TABLET(20 MG) BY MOUTH AT BEDTIME 90 tablet 1   loratadine (CLARITIN REDITABS) 10 MG dissolvable tablet Take 1 tablet (10 mg total) by mouth daily. As needed for allergy symptoms (Patient taking differently: Take 10 mg by mouth daily as needed for allergies.) 120 tablet 0   metFORMIN (GLUCOPHAGE-XR) 500 MG 24 hr tablet TAKE 1 TABLET(500 MG) BY MOUTH DAILY WITH BREAKFAST 90 tablet 0   Multiple Vitamin (MULTI-VITAMIN DAILY PO) Take 1 tablet by mouth daily.     naproxen (NAPROSYN) 500 MG tablet Take 1 tablet (500 mg total) by mouth 2 (two) times daily as needed. 30 tablet 0   Omega-3 300 MG CAPS Take 300 mg by mouth daily.     OVER THE COUNTER MEDICATION Take 1 tablet by mouth daily. Visiclear Eye Vitamin     OVER THE COUNTER MEDICATION Take 2 capsules by mouth daily. Pipsissewa (poison oak prevention)     RYBELSUS 14 MG TABS TAKE 1 TABLET(14 MG) BY MOUTH DAILY 30 tablet 1   sildenafil (VIAGRA) 100 MG tablet Take 0.5-1 tablets (50-100 mg total) by mouth daily as needed for erectile dysfunction. Start with 50 mg and if you need more, can increase to 100 5 tablet 11   simvastatin (ZOCOR) 40 MG tablet TAKE 1 TABLET BY MOUTH DAILY 90 tablet 2   venlafaxine (EFFEXOR) 75 MG tablet TAKE 3 TABLETS(225 MG) BY MOUTH DAILY 180 tablet 3   No facility-administered medications prior to visit.   Review of Systems  Review of Systems  Constitutional: Negative.   HENT: Negative.     Respiratory:  Negative for cough and wheezing.        DOE  Cardiovascular: Negative.    Physical Exam  BP 136/80 (BP Location: Left Arm, Cuff Size: Large)   Pulse 79   Ht 5\' 9"  (1.753 m)   Wt 259 lb 3.2 oz (117.6 kg)   SpO2 98%   BMI 38.28 kg/m  Physical Exam Constitutional:      General: He is not in acute distress.    Appearance: Normal appearance. He is obese. He is not ill-appearing.  HENT:     Head: Normocephalic and atraumatic.     Mouth/Throat:     Mouth:  Mucous membranes are moist.     Pharynx: Oropharynx is clear.  Cardiovascular:     Rate and Rhythm: Normal rate and regular rhythm.  Pulmonary:     Effort: Pulmonary effort is normal.     Breath sounds: Normal breath sounds.     Comments: ? Rales bilateral lung lobes  Skin:    General: Skin is warm and dry.  Neurological:     General: No focal deficit present.     Mental Status: He is alert and oriented to person, place, and time. Mental status is at baseline.  Psychiatric:        Mood and Affect: Mood normal.        Behavior: Behavior normal.        Thought Content: Thought content normal.        Judgment: Judgment normal.      Lab Results:  CBC    Component Value Date/Time   WBC 6.0 06/08/2022 1333   RBC 4.93 06/08/2022 1333   HGB 14.8 06/08/2022 1333   HGB 11.1 (L) 10/30/2013 0636   HCT 42.9 06/08/2022 1333   HCT 40.6 10/16/2013 1553   PLT 250 06/08/2022 1333   PLT 201 10/30/2013 0636   MCV 87.0 06/08/2022 1333   MCV 84 10/16/2013 1553   MCH 30.0 06/08/2022 1333   MCHC 34.5 06/08/2022 1333   RDW 12.8 06/08/2022 1333   RDW 13.1 10/16/2013 1553   LYMPHSABS 2.6 11/11/2010 1146   MONOABS 0.5 11/11/2010 1146   EOSABS 0.0 11/11/2010 1146   BASOSABS 0.1 11/11/2010 1146    BMET    Component Value Date/Time   NA 141 06/08/2022 1333   NA 141 05/05/2022 1227   NA 140 10/30/2013 0636   K 4.0 06/08/2022 1333   K 3.9 10/30/2013 0636   CL 107 06/08/2022 1333   CL 107 10/30/2013 0636   CO2 25  06/08/2022 1333   CO2 29 10/30/2013 0636   GLUCOSE 107 (H) 06/08/2022 1333   GLUCOSE 126 (H) 10/30/2013 0636   BUN 10 06/08/2022 1333   BUN 11 05/05/2022 1227   BUN 9 10/30/2013 0636   CREATININE 0.85 06/08/2022 1333   CREATININE 0.89 12/29/2015 1510   CALCIUM 9.9 06/08/2022 1333   CALCIUM 8.7 10/30/2013 0636   GFRNONAA >60 06/08/2022 1333   GFRNONAA >89 12/29/2015 1510   GFRAA 95 12/29/2017 0851   GFRAA >89 12/29/2015 1510    BNP No results found for: "BNP"  ProBNP No results found for: "PROBNP"  Imaging: No results found.   Assessment & Plan:   OSA (obstructive sleep apnea) - Well controlled on auto CPAP. Current pressure 5-15cm h20; Residual AHI 0.2/hour.  - Patient is 87% compliant with use >4 hours last 30 days  - No changes recommended, renew CPAP supplies with [facility] - Encourage patient continue to wear CPAP nightly 4-6 hours or longer and work on weight loss efforts - Fu in 1 year or sooner if needed   Dyspnea on exertion - Former marijuana smoker. Reports intermittent dyspnea with moderate exertion for the last several years. Most likely due to obesity/ recent weight gain. We will get a baseline CXR and send in Albuterol hfa to use every 6 hours as needed for shortness of breath. If not better, recommend getting pulmonary function testing to assess for obstructive lung disease  Glenford Bayley, NP 07/26/2023

## 2023-07-27 ENCOUNTER — Encounter: Payer: Self-pay | Admitting: Student

## 2023-07-31 DIAGNOSIS — G4733 Obstructive sleep apnea (adult) (pediatric): Secondary | ICD-10-CM | POA: Diagnosis not present

## 2023-08-16 NOTE — Progress Notes (Signed)
Please let patient know CXR was normal

## 2023-08-17 ENCOUNTER — Other Ambulatory Visit: Payer: Self-pay | Admitting: Primary Care

## 2023-08-21 ENCOUNTER — Other Ambulatory Visit: Payer: Self-pay | Admitting: Student

## 2023-08-21 DIAGNOSIS — F32A Depression, unspecified: Secondary | ICD-10-CM

## 2023-09-12 ENCOUNTER — Encounter: Payer: Self-pay | Admitting: Student

## 2023-09-13 ENCOUNTER — Other Ambulatory Visit: Payer: Self-pay | Admitting: Student

## 2023-09-15 IMAGING — CT CT ABD-PELV W/O CM
2 of 4 series · 16 of 46 positions shown, 18 images · non-contrast
Comparison: Prior CT of the abdomen and pelvis on 01/24/2011

CLINICAL DATA: Incisional hernia. Prior exploratory laparotomy
after trauma many years ago.

EXAM:
CT ABDOMEN AND PELVIS WITHOUT CONTRAST
TECHNIQUE: Multidetector CT imaging of the abdomen and pelvis was performed
following the standard protocol without IV contrast.

[Series 2: abd pel wo · axial · 0.85mm/px · z∈[-400,+20]mm · 13 of 94 slices shown, 15 images]
[im 5/94  soft-tissue]
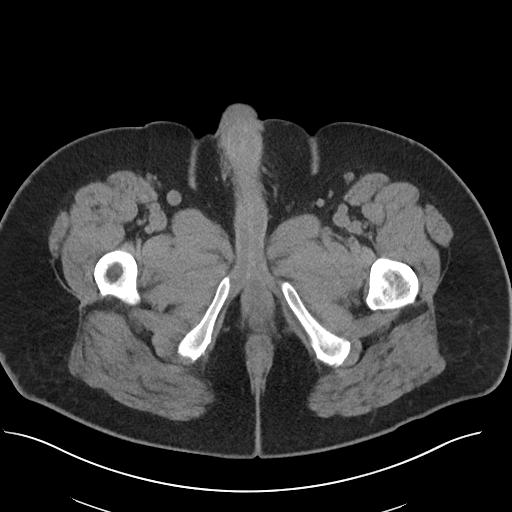
[im 5/94  bone]
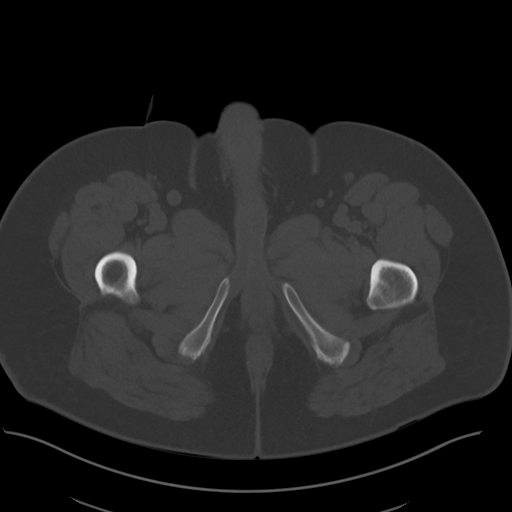
[im 13/94  soft-tissue]
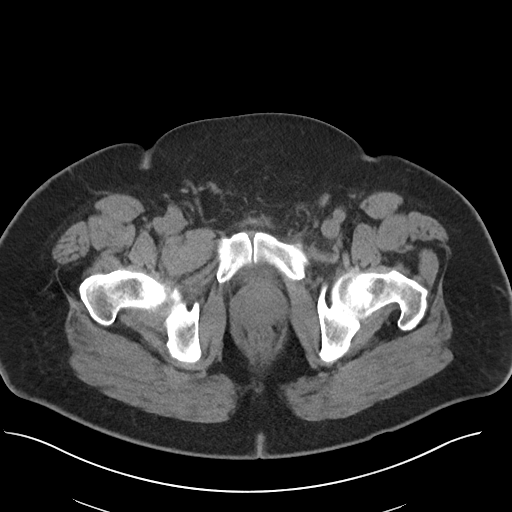
[im 21/94  soft-tissue]
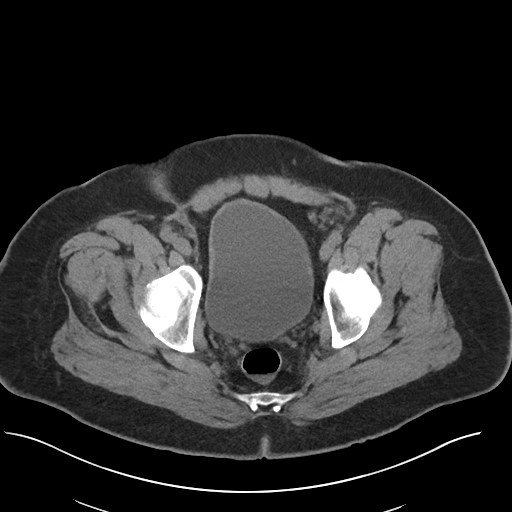
[im 25/94  soft-tissue]
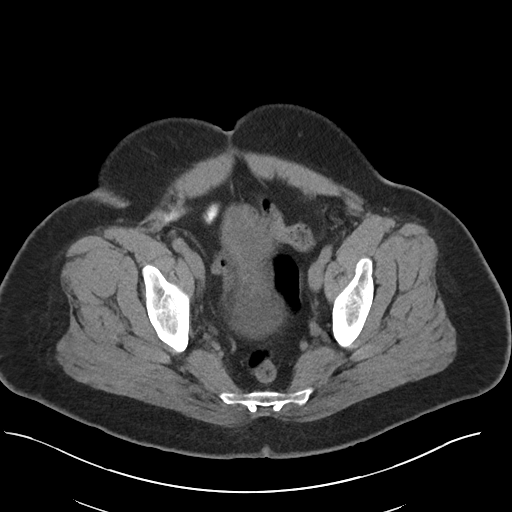
[im 33/94  soft-tissue]
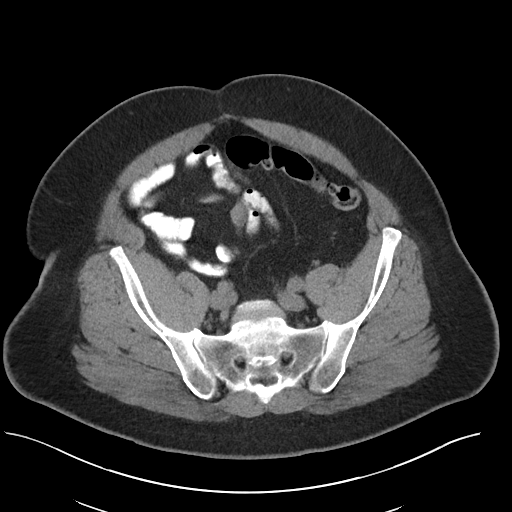
[im 41/94  soft-tissue]
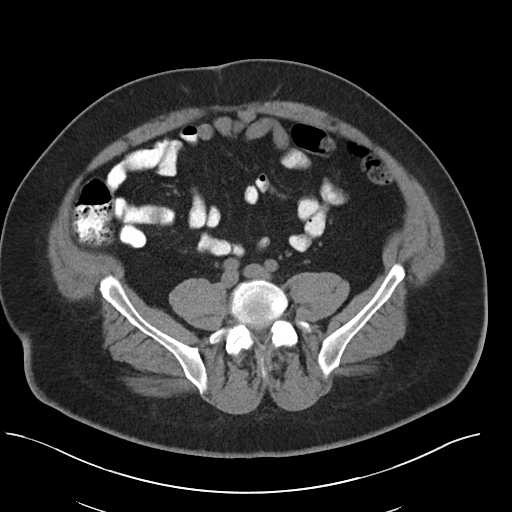
[im 49/94  soft-tissue]
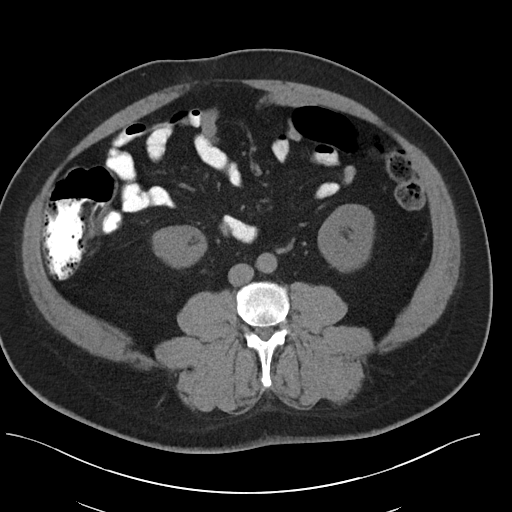
[im 53/94  soft-tissue]
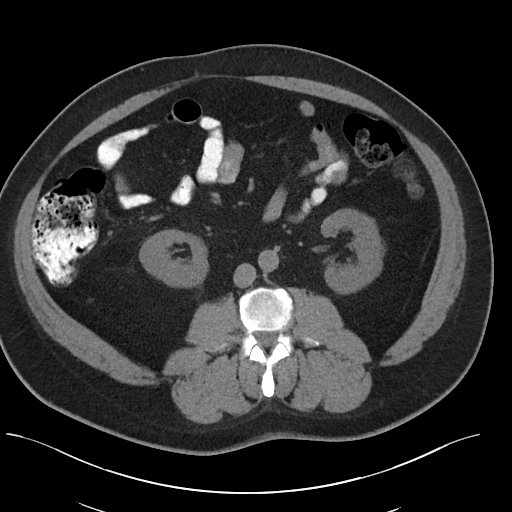
[im 61/94  soft-tissue]
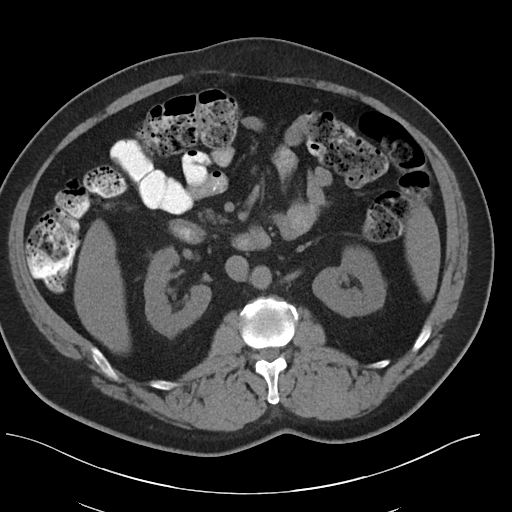
[im 61/94  bone]
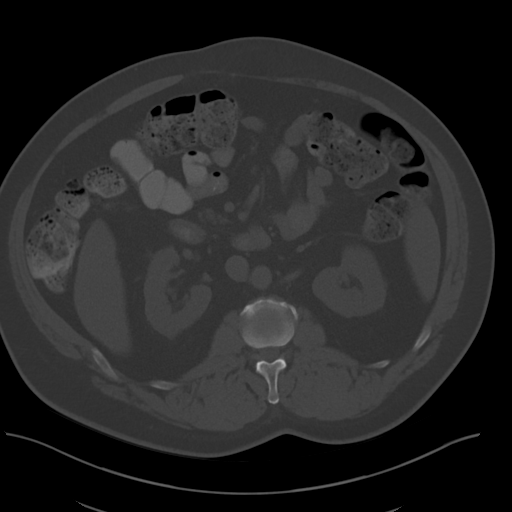
[im 69/94  soft-tissue]
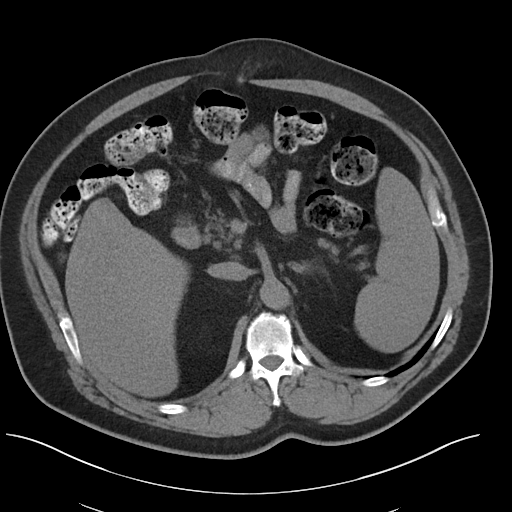
[im 73/94  soft-tissue]
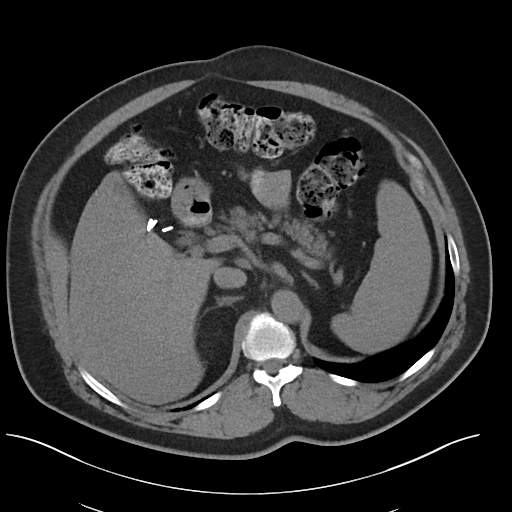
[im 81/94  soft-tissue]
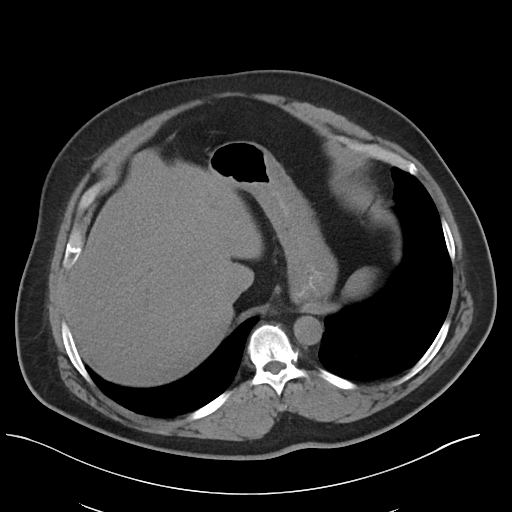
[im 89/94  soft-tissue]
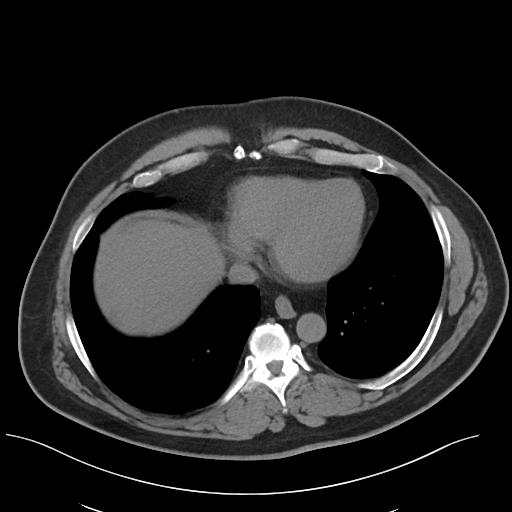

[Series 5: coronal · coronal · 0.90mm/px · 3 of 121 slices shown]
[im 41/121  soft-tissue]
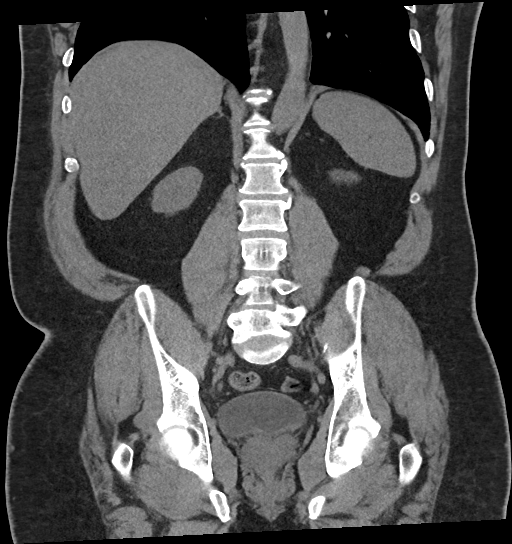
[im 54/121  soft-tissue]
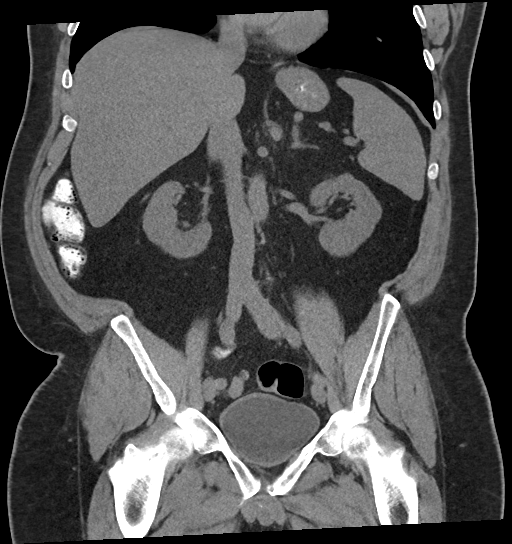
[im 67/121  soft-tissue]
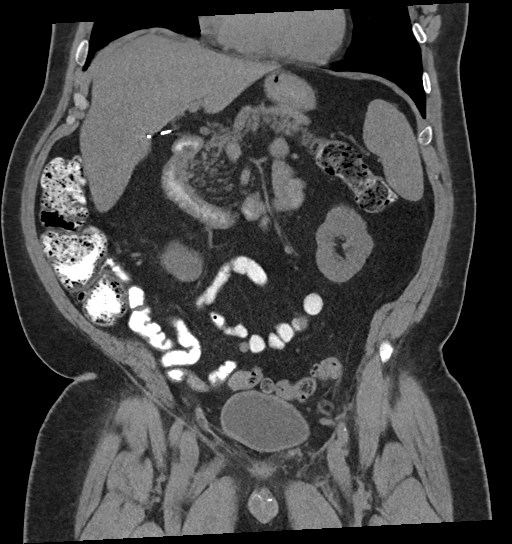

[16 of 46 positions shown; findings below may reference images not displayed]

FINDINGS: Lower chest: No acute abnormality.

Hepatobiliary: The liver demonstrates steatosis. The gallbladder has
been removed. No biliary ductal dilatation.

Pancreas: Unremarkable. No pancreatic ductal dilatation or
surrounding inflammatory changes.

Spleen: Normal in size without focal abnormality.

Adrenals/Urinary Tract: Adrenal glands are unremarkable. Kidneys are
normal, without renal calculi, focal lesion, or hydronephrosis.
Bladder is unremarkable.

Stomach/Bowel: Bowel shows no evidence of obstruction, ileus,
inflammation or lesion. The appendix appears to have been previously
removed. No free intraperitoneal air.

Vascular/Lymphatic: No significant vascular findings are present.
Incidental circumaortic left renal vein. No enlarged abdominal or
pelvic lymph nodes.

Reproductive: Prostate is unremarkable.

Other: Small midline ventral hernia present in the upper abdomen at
roughly the level of the T12 vertebral body. Opening in the
abdominal wall measures approximately 11 mm in height on coronal
reconstructions and the hernia contains only intra-abdominal fat and
no bowel or fluid. No significant inflammation is identified
associated with the hernia.

There is a small right inguinal hernia containing fat. No ascites or
focal abscess identified.

Musculoskeletal: No acute or significant osseous findings.
IMPRESSION: 1. Small midline upper abdominal ventral hernia containing fat and
located at roughly the T12 vertebral body level. Opening in the
abdominal wall musculature measures approximately 11 mm in height.
2. Small right inguinal hernia containing fat.
3. Hepatic steatosis.

## 2023-09-17 DIAGNOSIS — G4733 Obstructive sleep apnea (adult) (pediatric): Secondary | ICD-10-CM | POA: Diagnosis not present

## 2023-09-19 ENCOUNTER — Other Ambulatory Visit: Payer: Self-pay | Admitting: Student

## 2023-09-30 ENCOUNTER — Other Ambulatory Visit: Payer: Self-pay | Admitting: Student

## 2023-09-30 DIAGNOSIS — I10 Essential (primary) hypertension: Secondary | ICD-10-CM

## 2023-10-05 ENCOUNTER — Other Ambulatory Visit: Payer: Self-pay | Admitting: Primary Care

## 2023-10-18 ENCOUNTER — Other Ambulatory Visit: Payer: Self-pay | Admitting: Student

## 2023-10-18 DIAGNOSIS — E8881 Metabolic syndrome: Secondary | ICD-10-CM

## 2023-10-18 DIAGNOSIS — E88819 Insulin resistance, unspecified: Secondary | ICD-10-CM

## 2023-10-19 ENCOUNTER — Other Ambulatory Visit: Payer: Self-pay | Admitting: Student

## 2023-10-19 DIAGNOSIS — E8881 Metabolic syndrome: Secondary | ICD-10-CM

## 2023-10-19 DIAGNOSIS — E88819 Insulin resistance, unspecified: Secondary | ICD-10-CM

## 2023-10-30 DIAGNOSIS — G4733 Obstructive sleep apnea (adult) (pediatric): Secondary | ICD-10-CM | POA: Diagnosis not present

## 2023-10-31 DIAGNOSIS — G4733 Obstructive sleep apnea (adult) (pediatric): Secondary | ICD-10-CM | POA: Diagnosis not present

## 2023-11-13 ENCOUNTER — Other Ambulatory Visit: Payer: Self-pay | Admitting: Student

## 2023-11-13 DIAGNOSIS — E785 Hyperlipidemia, unspecified: Secondary | ICD-10-CM

## 2023-11-13 DIAGNOSIS — R7303 Prediabetes: Secondary | ICD-10-CM

## 2023-11-13 DIAGNOSIS — I10 Essential (primary) hypertension: Secondary | ICD-10-CM

## 2023-11-13 DIAGNOSIS — E8881 Metabolic syndrome: Secondary | ICD-10-CM

## 2023-11-14 ENCOUNTER — Encounter: Payer: Self-pay | Admitting: Student

## 2023-11-15 ENCOUNTER — Other Ambulatory Visit: Payer: Self-pay | Admitting: Student

## 2023-11-16 ENCOUNTER — Encounter: Payer: Self-pay | Admitting: Student

## 2023-11-16 MED ORDER — RYBELSUS 14 MG PO TABS: ORAL_TABLET | ORAL | 1 refills | Status: DC

## 2023-11-20 ENCOUNTER — Encounter: Payer: Self-pay | Admitting: Student

## 2023-11-21 ENCOUNTER — Encounter: Payer: Self-pay | Admitting: Student

## 2023-11-21 DIAGNOSIS — I10 Essential (primary) hypertension: Secondary | ICD-10-CM

## 2023-11-21 DIAGNOSIS — R7309 Other abnormal glucose: Secondary | ICD-10-CM

## 2023-11-21 DIAGNOSIS — R7303 Prediabetes: Secondary | ICD-10-CM

## 2023-11-21 DIAGNOSIS — E785 Hyperlipidemia, unspecified: Secondary | ICD-10-CM

## 2023-11-22 NOTE — Telephone Encounter (Signed)
 Rec'd PA request for Rybelsus  medication.   Medication not covered under medicaid. Ozempic  preferred, but for dx of diabetes. Wegovy  preferred for weight loss (with accurate documentation).

## 2023-11-23 MED ORDER — WEGOVY 0.25 MG/0.5ML ~~LOC~~ SOAJ: 0.2500 mg | SUBCUTANEOUS | 1 refills | Status: DC

## 2023-11-24 ENCOUNTER — Other Ambulatory Visit: Payer: Self-pay | Admitting: Primary Care

## 2023-11-24 ENCOUNTER — Other Ambulatory Visit: Payer: Self-pay | Admitting: Student

## 2023-11-24 ENCOUNTER — Telehealth: Payer: Self-pay

## 2023-11-24 NOTE — Telephone Encounter (Signed)
 Pharmacy Patient Advocate Encounter   Received notification from CoverMyMeds that prior authorization for RYBELSUS  is required/requested.   Insurance verification completed.   The patient is insured through [facility] .   PA required; PA submitted to above mentioned insurance via CoverMyMeds Key/confirmation #/EOC [id number]. Status is pending

## 2023-11-27 NOTE — Telephone Encounter (Signed)
 Pharmacy Patient Advocate Encounter  Received notification from [employer] that Prior Authorization for RYBELSUS  has been DENIED.  Full denial letter will be uploaded to the media tab. See denial reason below.  Drugs when used for anorexia, weight loss, or weight gain are excluded from coverage under Medicare rules.  PA #/Case ID/Reference #: [id number]

## 2023-11-29 NOTE — Telephone Encounter (Signed)
Patient has known history of DM2 in his family and is high risk for diabetes given obesity and HTN. He would benefit from GLP1 for prediabetes to prevent progression to diabetes. Would recommend Rybelsus or Ozempic as he has taken each of these in the past. Additionally, continue working with nutrition and activity.

## 2023-11-30 MED ORDER — RYBELSUS 14 MG PO TABS: ORAL_TABLET | ORAL | 1 refills | Status: DC

## 2023-11-30 NOTE — Addendum Note (Signed)
Addended by: [name] on: 11/30/2023 09:22 AM   Modules accepted: Orders

## 2023-12-09 ENCOUNTER — Other Ambulatory Visit: Payer: Self-pay | Admitting: Student

## 2023-12-14 ENCOUNTER — Other Ambulatory Visit: Payer: Self-pay | Admitting: Student

## 2023-12-16 DIAGNOSIS — G4733 Obstructive sleep apnea (adult) (pediatric): Secondary | ICD-10-CM | POA: Diagnosis not present

## 2023-12-23 ENCOUNTER — Other Ambulatory Visit: Payer: Self-pay | Admitting: Student

## 2023-12-23 DIAGNOSIS — F3341 Major depressive disorder, recurrent, in partial remission: Secondary | ICD-10-CM

## 2023-12-23 DIAGNOSIS — J302 Other seasonal allergic rhinitis: Secondary | ICD-10-CM

## 2023-12-28 ENCOUNTER — Other Ambulatory Visit: Payer: Self-pay | Admitting: Student

## 2024-01-10 ENCOUNTER — Other Ambulatory Visit: Payer: Self-pay | Admitting: Student

## 2024-01-10 DIAGNOSIS — E785 Hyperlipidemia, unspecified: Secondary | ICD-10-CM

## 2024-01-10 DIAGNOSIS — R7303 Prediabetes: Secondary | ICD-10-CM

## 2024-01-10 DIAGNOSIS — I10 Essential (primary) hypertension: Secondary | ICD-10-CM

## 2024-01-10 DIAGNOSIS — E8881 Metabolic syndrome: Secondary | ICD-10-CM

## 2024-01-12 ENCOUNTER — Telehealth: Payer: Self-pay

## 2024-01-12 NOTE — Telephone Encounter (Signed)
 Rec'd PA request for Ozempic medication.   Medication requires dx of diabetes.   Not approved for treatment of weight loss, obesity, or OSA.

## 2024-01-15 ENCOUNTER — Other Ambulatory Visit: Payer: Self-pay | Admitting: Student

## 2024-01-15 ENCOUNTER — Other Ambulatory Visit: Payer: Self-pay | Admitting: Primary Care

## 2024-01-15 DIAGNOSIS — E8881 Metabolic syndrome: Secondary | ICD-10-CM

## 2024-01-15 DIAGNOSIS — E88819 Insulin resistance, unspecified: Secondary | ICD-10-CM

## 2024-01-18 ENCOUNTER — Encounter: Payer: Self-pay | Admitting: Student

## 2024-01-18 ENCOUNTER — Other Ambulatory Visit: Payer: Self-pay | Admitting: Student

## 2024-01-18 DIAGNOSIS — I10 Essential (primary) hypertension: Secondary | ICD-10-CM

## 2024-01-28 DIAGNOSIS — G4733 Obstructive sleep apnea (adult) (pediatric): Secondary | ICD-10-CM | POA: Diagnosis not present

## 2024-01-29 DIAGNOSIS — G4733 Obstructive sleep apnea (adult) (pediatric): Secondary | ICD-10-CM | POA: Diagnosis not present

## 2024-03-11 ENCOUNTER — Other Ambulatory Visit: Payer: Self-pay | Admitting: Student

## 2024-03-17 DIAGNOSIS — G4733 Obstructive sleep apnea (adult) (pediatric): Secondary | ICD-10-CM | POA: Diagnosis not present

## 2024-03-25 ENCOUNTER — Other Ambulatory Visit: Payer: Self-pay | Admitting: Student

## 2024-03-25 DIAGNOSIS — I10 Essential (primary) hypertension: Secondary | ICD-10-CM

## 2024-03-26 ENCOUNTER — Encounter: Payer: Self-pay | Admitting: *Deleted

## 2024-04-17 ENCOUNTER — Other Ambulatory Visit: Payer: Self-pay | Admitting: Student

## 2024-04-17 DIAGNOSIS — F32A Depression, unspecified: Secondary | ICD-10-CM

## 2024-04-22 ENCOUNTER — Encounter: Payer: Self-pay | Admitting: Family Medicine

## 2024-04-22 ENCOUNTER — Other Ambulatory Visit: Payer: Self-pay

## 2024-04-22 DIAGNOSIS — E88819 Insulin resistance, unspecified: Secondary | ICD-10-CM

## 2024-04-22 DIAGNOSIS — E8881 Metabolic syndrome: Secondary | ICD-10-CM

## 2024-04-22 MED ORDER — METFORMIN HCL ER 500 MG PO TB24: 500.0000 mg | ORAL_TABLET | Freq: Every day | ORAL | 1 refills | Status: DC

## 2024-04-29 DIAGNOSIS — G4733 Obstructive sleep apnea (adult) (pediatric): Secondary | ICD-10-CM | POA: Diagnosis not present

## 2024-05-04 IMAGING — CR DG SHOULDER 2+V*R*
3 series · 3 of 3 positions shown · non-contrast
Comparison: None Available.

CLINICAL DATA: Shoulder pain 1 month.

EXAM:
RIGHT SHOULDER - 2+ VIEW

[shoulder grashey]
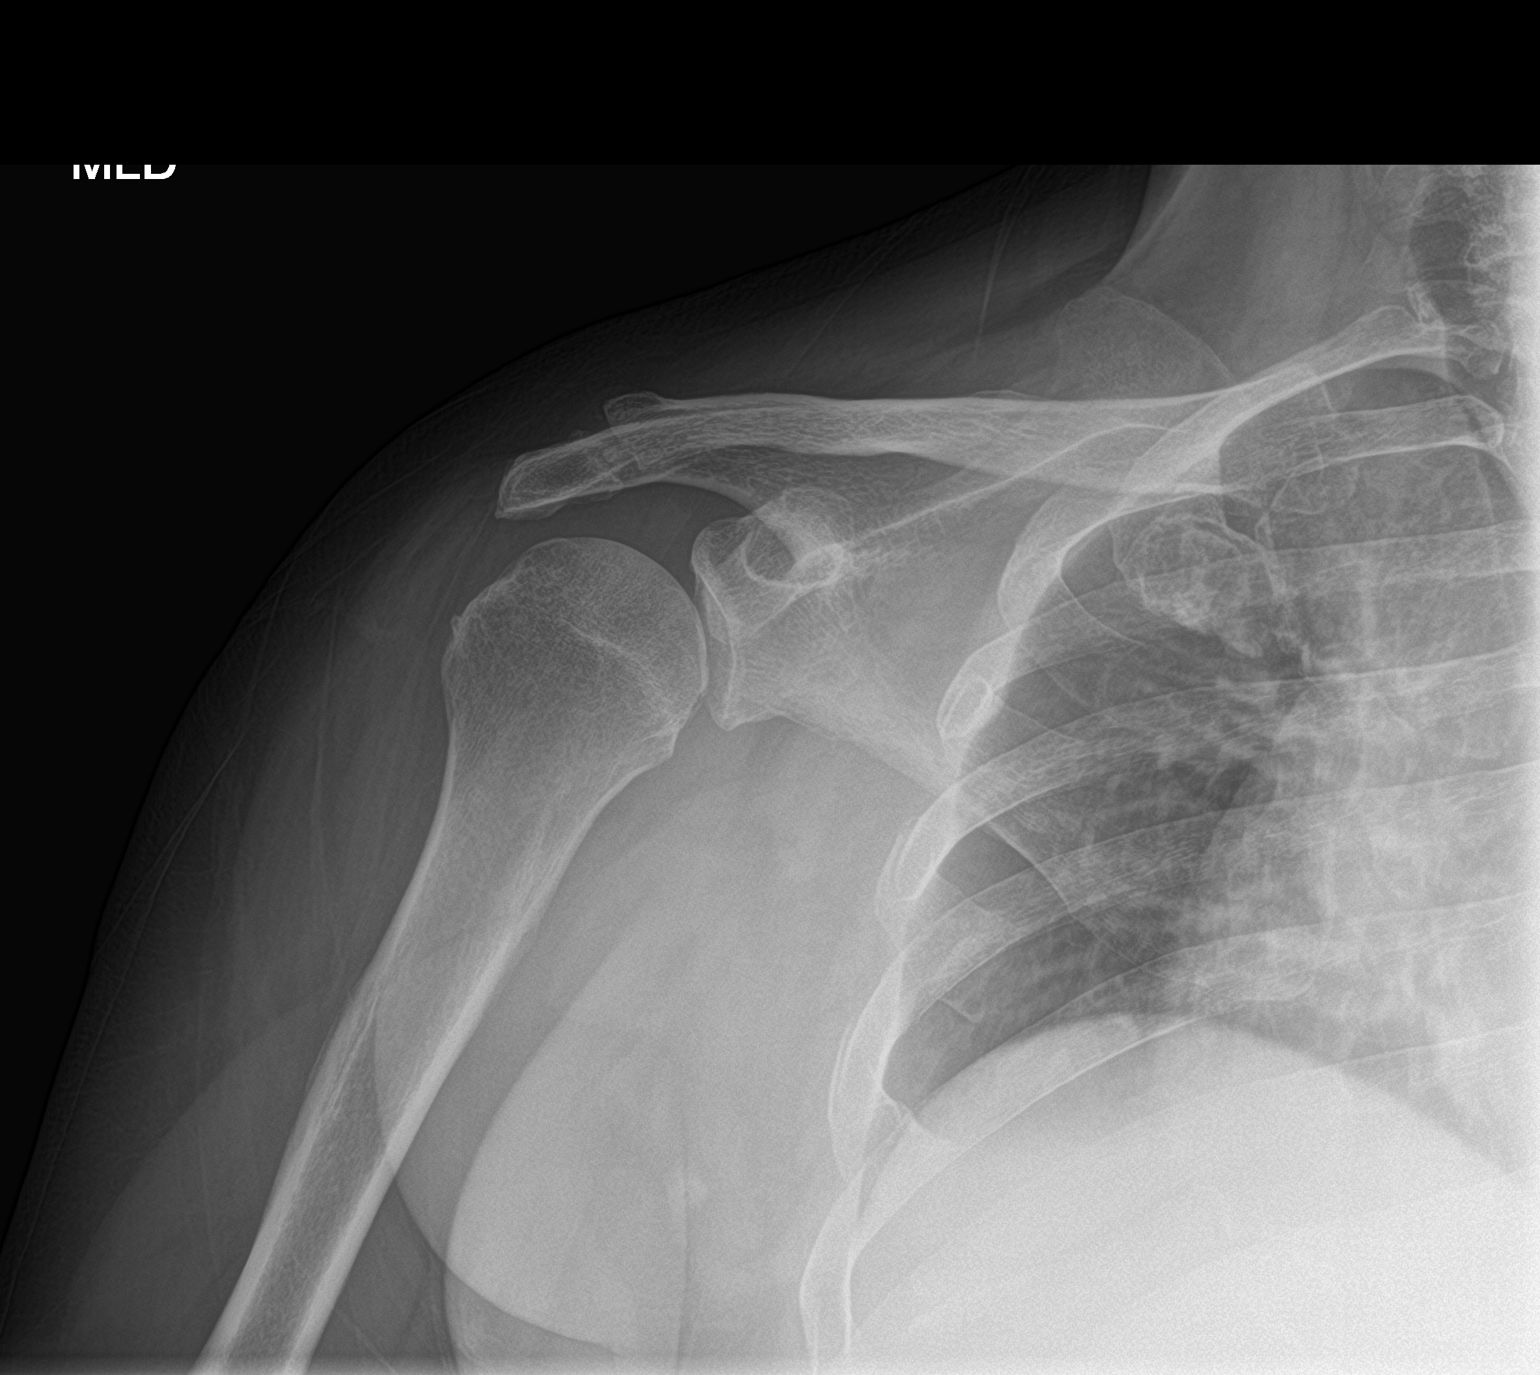

[shoulder y view]
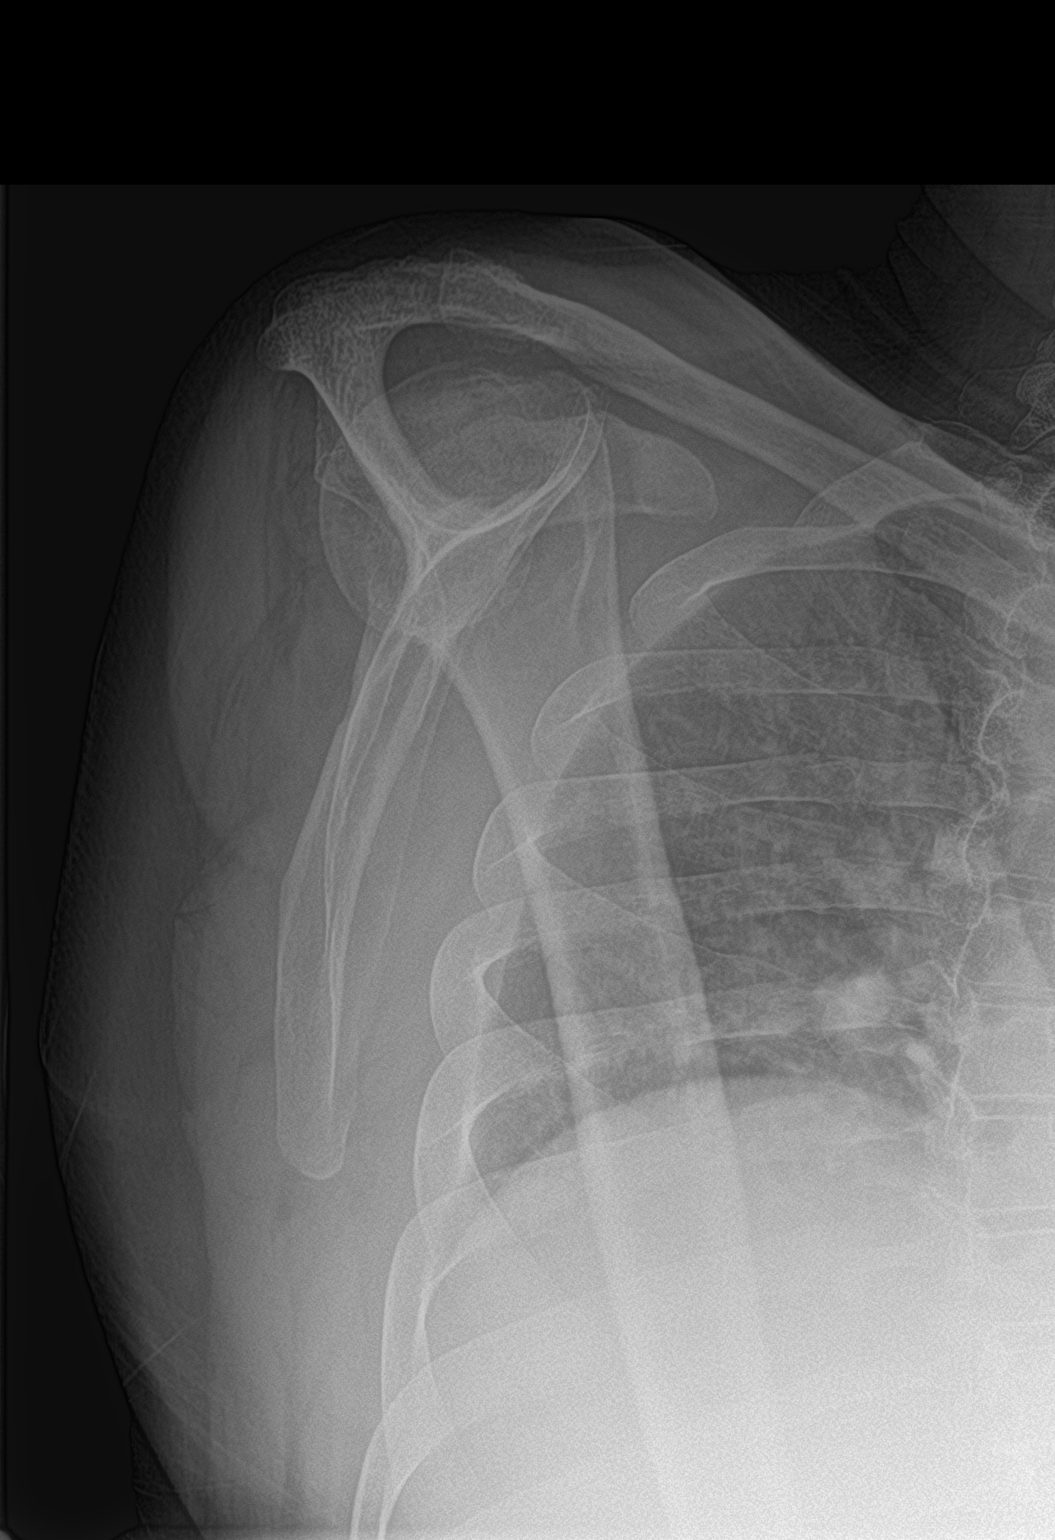

[shoulder ap neutral]
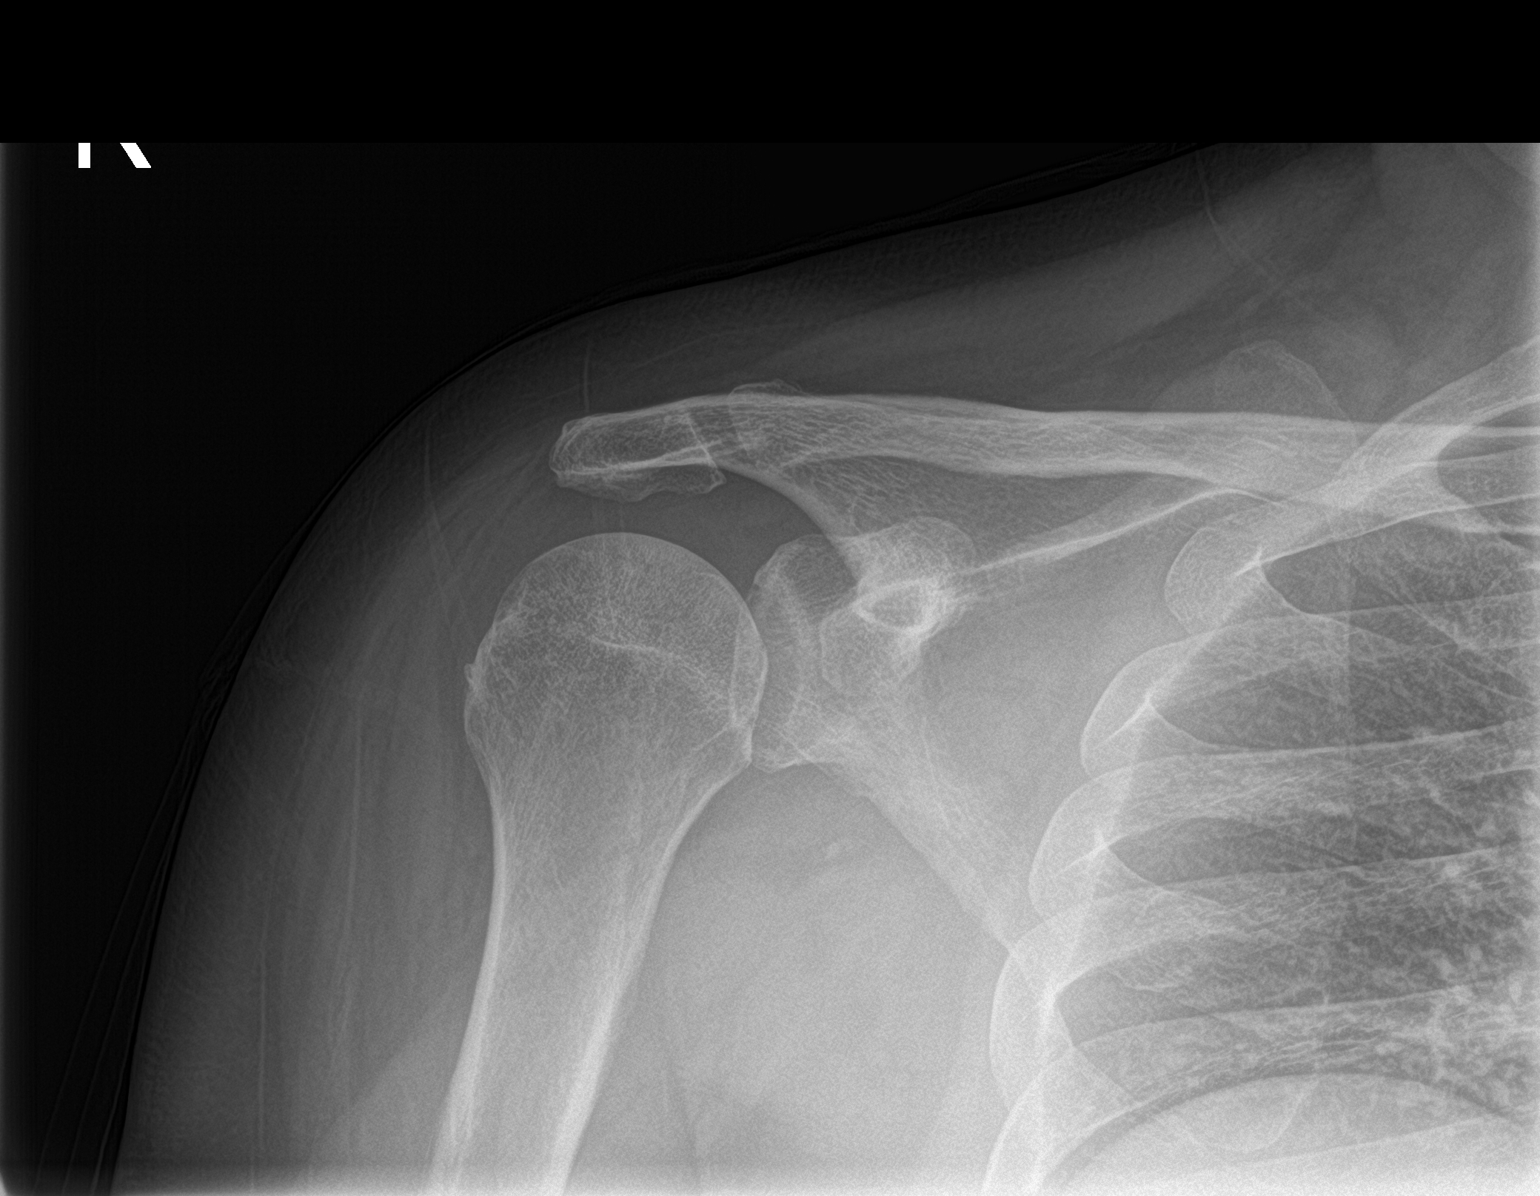

[3 of 3 positions shown; findings below may reference images not displayed]

FINDINGS: There is no evidence of fracture or dislocation. Minimal
osteophytosis is identified in the lateral aspect of the right
proximal humerus. Soft tissues are unremarkable.
IMPRESSION: No acute fracture or dislocation. Minimal osteophytosis is
identified in the lateral aspect of the right proximal humerus.

## 2024-05-15 DIAGNOSIS — H4720 Unspecified optic atrophy: Secondary | ICD-10-CM | POA: Diagnosis not present

## 2024-05-15 DIAGNOSIS — H5340 Unspecified visual field defects: Secondary | ICD-10-CM | POA: Diagnosis not present

## 2024-05-15 DIAGNOSIS — H1789 Other corneal scars and opacities: Secondary | ICD-10-CM | POA: Diagnosis not present

## 2024-05-16 ENCOUNTER — Ambulatory Visit

## 2024-05-16 VITALS — Ht 67.0 in | Wt 255.0 lb

## 2024-05-16 DIAGNOSIS — Z0000 Encounter for general adult medical examination without abnormal findings: Secondary | ICD-10-CM | POA: Diagnosis not present
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
## 2024-05-16 NOTE — Patient Instructions (Signed)
 Kyle Kennedy , Thank you for taking time out of your busy schedule to complete your Annual Wellness Visit with me. I enjoyed our conversation and look forward to speaking with you again next year. I, as well as your care team,  appreciate your ongoing commitment to your health goals. Please review the following plan we discussed and let me know if I can assist you in the future. Your Game plan/ To Do List    Referrals: If you haven't heard from the office you've been referred to, please reach out to them at the phone provided.   Follow up Visits: We will see or speak with you next year for your Next Medicare AWV with our clinical staff Have you seen your provider in the last 6 months (3 months if uncontrolled diabetes)? No  Clinician Recommendations:  Aim for 30 minutes of exercise or brisk walking, 6-8 glasses of water, and 5 servings of fruits and vegetables each day.       This is a list of the screenings recommended for you:  Health Maintenance  Topic Date Due   Hepatitis B Vaccine (1 of 3 - 19+ 3-dose series) Never done   Pneumococcal Vaccine for high risk medical condition (2 of 2 - PPSV23, PCV20, or PCV21) 10/09/2020   Pneumococcal Vaccine for age over 55 (2 of 2 - PPSV23, PCV20, or PCV21) 10/09/2020   Cologuard (Stool DNA test)  01/06/2023   DTaP/Tdap/Td vaccine (2 - Td or Tdap) 08/08/2023   Flu Shot  05/17/2024   Medicare Annual Wellness Visit  05/16/2025   COVID-19 Vaccine  Completed   Hepatitis C Screening  Completed   HIV Screening  Completed   Zoster (Shingles) Vaccine  Completed   HPV Vaccine  Aged Out   Meningitis B Vaccine  Aged Out    Advanced directives: (Declined) Advance directive discussed with you today. Even though you declined this today, please call our office should you change your mind, and we can give you the proper paperwork for you to fill out. Advance Care Planning is important because it:  [x]  Makes sure you receive the medical care that is consistent  with your values, goals, and preferences  [x]  It provides guidance to your family and loved ones and reduces their decisional burden about whether or not they are making the right decisions based on your wishes.  Follow the link provided in your after visit summary or read over the paperwork we have mailed to you to help you started getting your Advance Directives in place. If you need assistance in completing these, please reach out to us  so that we can help you!  See attachments for Preventive Care and Fall Prevention Tips.

## 2024-05-31 ENCOUNTER — Encounter (INDEPENDENT_AMBULATORY_CARE_PROVIDER_SITE_OTHER): Payer: Self-pay

## 2024-06-13 ENCOUNTER — Other Ambulatory Visit: Payer: Self-pay

## 2024-06-13 DIAGNOSIS — F3341 Major depressive disorder, recurrent, in partial remission: Secondary | ICD-10-CM

## 2024-06-14 MED ORDER — ESOMEPRAZOLE MAGNESIUM 40 MG PO CPDR: 40.0000 mg | DELAYED_RELEASE_CAPSULE | Freq: Two times a day (BID) | ORAL | 0 refills | Status: DC

## 2024-06-14 MED ORDER — BUPROPION HCL ER (XL) 150 MG PO TB24: 150.0000 mg | ORAL_TABLET | Freq: Every day | ORAL | 1 refills | Status: AC

## 2024-06-17 DIAGNOSIS — G4733 Obstructive sleep apnea (adult) (pediatric): Secondary | ICD-10-CM | POA: Diagnosis not present

## 2024-06-21 ENCOUNTER — Other Ambulatory Visit: Payer: Self-pay

## 2024-06-21 DIAGNOSIS — J302 Other seasonal allergic rhinitis: Secondary | ICD-10-CM

## 2024-06-21 MED ORDER — FLUTICASONE PROPIONATE 50 MCG/ACT NA SUSP: 2.0000 | Freq: Every day | NASAL | 1 refills | Status: AC

## 2024-07-04 ENCOUNTER — Encounter: Payer: Self-pay | Admitting: Family Medicine

## 2024-07-04 ENCOUNTER — Telehealth: Payer: Self-pay

## 2024-07-04 ENCOUNTER — Ambulatory Visit (INDEPENDENT_AMBULATORY_CARE_PROVIDER_SITE_OTHER): Admitting: Family Medicine

## 2024-07-04 VITALS — BP 130/69 | HR 71 | Ht 67.0 in | Wt 270.0 lb

## 2024-07-04 DIAGNOSIS — R7303 Prediabetes: Secondary | ICD-10-CM

## 2024-07-04 DIAGNOSIS — Z23 Encounter for immunization: Secondary | ICD-10-CM | POA: Diagnosis not present

## 2024-07-04 DIAGNOSIS — E785 Hyperlipidemia, unspecified: Secondary | ICD-10-CM

## 2024-07-04 DIAGNOSIS — I10 Essential (primary) hypertension: Secondary | ICD-10-CM | POA: Diagnosis not present

## 2024-07-04 DIAGNOSIS — E8881 Metabolic syndrome: Secondary | ICD-10-CM

## 2024-07-04 LAB — POCT GLYCOSYLATED HEMOGLOBIN (HGB A1C): HbA1c, POC (prediabetic range): 5.9 % (ref 5.7–6.4)

## 2024-07-04 MED ORDER — OZEMPIC (0.25 OR 0.5 MG/DOSE) 2 MG/3ML ~~LOC~~ SOPN: 0.2500 mg | PEN_INJECTOR | SUBCUTANEOUS | 4 refills | Status: DC

## 2024-07-04 NOTE — Progress Notes (Signed)
    SUBJECTIVE:   CHIEF COMPLAINT / HPI:   Patient presents for routine lab maintenance of chronic conditions.  His blood pressure is well-controlled and he reports daily compliance with his medications.  He requests a refill of his Ozempic  if possible.  PERTINENT  PMH / PSH: Hypertension, OSA, prediabetes  OBJECTIVE:   BP 130/69   Pulse 71   Ht 5' 7 (1.702 m)   Wt 270 lb (122.5 kg)   SpO2 96%   BMI 42.29 kg/m   General: A&O, NAD Cardiac: RRR, no m/r/g Respiratory: CTAB, normal WOB, no w/c/r  ASSESSMENT/PLAN:   Assessment & Plan Prediabetes - A1c today 5.9, still prediabetic -Given patient's multiple other conditions and probable metabolic syndrome, will restart Ozempic  today for glycemic control Hyperlipidemia, unspecified hyperlipidemia type - Routine lipid monitoring today, will adjust medications as appropriate -Patient currently on simvastatin  40 mg daily, well-tolerated Severe obesity (BMI >= 40) (HCC) - Weight stable at 270 pounds - Patient desires to restart Ozempic  for glycemic control, will also assist with weight loss Primary hypertension - Blood pressure at goal today, continue current medications -No dosage adjustments for lisinopril  at this time Metabolic syndrome - See plan as above, patient does still meet criteria   Lucie Pinal, DO [facility]

## 2024-07-04 NOTE — Assessment & Plan Note (Signed)
-   Routine lipid monitoring today, will adjust medications as appropriate -Patient currently on simvastatin  40 mg daily, well-tolerated

## 2024-07-04 NOTE — Addendum Note (Signed)
 Addended by: Malarie Tappen on: 07/04/2024 10:50 AM   Modules accepted: Orders

## 2024-07-04 NOTE — Assessment & Plan Note (Signed)
-   Blood pressure at goal today, continue current medications -No dosage adjustments for lisinopril  at this time

## 2024-07-04 NOTE — Telephone Encounter (Signed)
 [name] is approved exclusively as an adjunct to diet and exercise to improve glycemic control in adults with type 2 diabetes mellitus. A review of patient's medical chart reveals no documented diagnosis of type 2 diabetes or an A1C indicative of diabetes. Therefore, they do not currently meet the criteria for prior authorization of this medication.   Starting Oct 1st Medicaid will no longer cover GLPs for weight loss.

## 2024-07-04 NOTE — Assessment & Plan Note (Signed)
-   Weight stable at 270 pounds - Patient desires to restart Ozempic  for glycemic control, will also assist with weight loss

## 2024-07-04 NOTE — Patient Instructions (Signed)
 It was wonderful to see you today!  Today was a regular health maintenance visit.  We did some basic blood work to monitor your electrolytes and your cholesterol, as well as your A1c.  Your A1c was 5.9 which is still within the prediabetic range but has not progressed any further.  I have sent a refill of your Ozempic  to your pharmacy, if there are any issues please feel free to reach out to the office for assistance.  Please call [phone] with any questions about today's appointment.   If you need any additional refills, please call your pharmacy before calling the office.  Lucie Pinal, DO Family Medicine

## 2024-07-04 NOTE — Assessment & Plan Note (Signed)
-   A1c today 5.9, still prediabetic -Given patient's multiple other conditions and probable metabolic syndrome, will restart Ozempic  today for glycemic control

## 2024-07-05 ENCOUNTER — Ambulatory Visit (HOSPITAL_COMMUNITY): Payer: Self-pay | Admitting: Family Medicine

## 2024-07-05 LAB — BASIC METABOLIC PANEL WITH GFR
BUN/Creatinine Ratio: 14 (ref 9–20)
BUN: 12 mg/dL (ref 6–24)
CO2: 21 mmol/L (ref 20–29)
Calcium: 9.7 mg/dL (ref 8.7–10.2)
Chloride: 103 mmol/L (ref 96–106)
Creatinine, Ser: 0.85 mg/dL (ref 0.76–1.27)
Glucose: 125 mg/dL — ABNORMAL HIGH (ref 70–99)
Potassium: 4.2 mmol/L (ref 3.5–5.2)
Sodium: 140 mmol/L (ref 134–144)
eGFR: 103 mL/min/1.73 (ref >59)

## 2024-07-05 LAB — LIPID PANEL
Chol/HDL Ratio: 4.2 ratio (ref 0.0–5.0)
Cholesterol, Total: 152 mg/dL (ref 100–199)
HDL: 36 mg/dL — ABNORMAL LOW (ref >39)
LDL Chol Calc (NIH): 71 mg/dL (ref 0–99)
Triglycerides: 282 mg/dL — ABNORMAL HIGH (ref 0–149)
VLDL Cholesterol Cal: 45 mg/dL — ABNORMAL HIGH (ref 5–40)

## 2024-07-15 ENCOUNTER — Other Ambulatory Visit: Payer: Self-pay

## 2024-07-15 DIAGNOSIS — I10 Essential (primary) hypertension: Secondary | ICD-10-CM

## 2024-07-15 MED ORDER — AMLODIPINE BESYLATE 10 MG PO TABS: 10.0000 mg | ORAL_TABLET | Freq: Every day | ORAL | 1 refills | Status: AC

## 2024-07-30 DIAGNOSIS — G4733 Obstructive sleep apnea (adult) (pediatric): Secondary | ICD-10-CM | POA: Diagnosis not present

## 2024-08-07 ENCOUNTER — Other Ambulatory Visit: Payer: Self-pay

## 2024-08-08 MED ORDER — GABAPENTIN 600 MG PO TABS: 900.0000 mg | ORAL_TABLET | Freq: Every day | ORAL | 3 refills | Status: AC | PRN

## 2024-09-11 ENCOUNTER — Telehealth: Payer: Self-pay

## 2024-09-11 ENCOUNTER — Encounter: Payer: Self-pay | Admitting: Primary Care

## 2024-09-11 ENCOUNTER — Telehealth: Payer: Self-pay | Admitting: Primary Care

## 2024-09-11 ENCOUNTER — Other Ambulatory Visit (HOSPITAL_COMMUNITY): Payer: Self-pay

## 2024-09-11 ENCOUNTER — Ambulatory Visit: Admitting: Primary Care

## 2024-09-11 VITALS — BP 133/87 | HR 65 | Temp 98.3°F | Ht 67.0 in | Wt 272.4 lb

## 2024-09-11 DIAGNOSIS — R0602 Shortness of breath: Secondary | ICD-10-CM | POA: Diagnosis not present

## 2024-09-11 DIAGNOSIS — R7303 Prediabetes: Secondary | ICD-10-CM

## 2024-09-11 DIAGNOSIS — Z6841 Body Mass Index (BMI) 40.0 and over, adult: Secondary | ICD-10-CM

## 2024-09-11 DIAGNOSIS — G4733 Obstructive sleep apnea (adult) (pediatric): Secondary | ICD-10-CM | POA: Diagnosis not present

## 2024-09-11 MED ORDER — TIRZEPATIDE-WEIGHT MANAGEMENT 7.5 MG/0.5ML ~~LOC~~ SOLN: 7.5000 mg | SUBCUTANEOUS | 0 refills | Status: DC

## 2024-09-11 MED ORDER — ZEPBOUND 5 MG/0.5ML ~~LOC~~ SOAJ: 5.0000 mg | SUBCUTANEOUS | 0 refills | Status: DC

## 2024-09-11 MED ORDER — ZEPBOUND 2.5 MG/0.5ML ~~LOC~~ SOAJ: 2.5000 mg | SUBCUTANEOUS | 0 refills | Status: DC

## 2024-09-11 NOTE — Telephone Encounter (Signed)
 Patient has moderate OSA and Obesity, I am going to send in prescription for zepbound  2.5mg  [place] once weekly

## 2024-09-11 NOTE — Patient Instructions (Addendum)
  VISIT SUMMARY: You came in today for a follow-up visit regarding your sleep apnea. We discussed your current use of the CPAP machine, your ongoing shortness of breath, and your history of prediabetes. We also talked about your weight management and the issues you've had with obtaining Ozempic .  YOUR PLAN: -OBSTRUCTIVE SLEEP APNEA DUE TO OBESITY: Obstructive sleep apnea is a condition where your airway becomes blocked during sleep, often due to obesity. Your condition is well controlled with your CPAP machine, which you use consistently. We discussed the occasional issues with the machine and your use of hydrogen peroxide in the water chamber. This has not been study and would not recommend using this, it can cause irritation to nose and lungs. To help with weight loss and better manage your sleep apnea, I have prescribed Zepbound . Start with 2.5 mg once weekly, increase to 5 mg after one month, and then to 7.5 mg after another month. The target dose is between 10 and 15 mg. Potential side effects include nausea, vomiting, and diarrhea. Continue using your CPAP machine as you have been.  -SHORTNESS OF BREATH: You have been experiencing ongoing shortness of breath, especially with physical activity. This has not improved despite using your CPAP machine. We will conduct a pulmonary function test to further evaluate your lung function. Continue using albuterol  as needed for relief.  -PREDIABETES: Prediabetes is a condition where your blood sugar levels are higher than normal but not high enough to be classified as diabetes. You were previously managing this with Ozempic , which also helped with weight loss. Due to coverage issues, you have not been able to continue with Ozempic . We will use Zepbound  as an alternative to help manage your weight and blood sugar levels.  INSTRUCTIONS: Please schedule a pulmonary function test to assess your lung function. Continue using your CPAP machine and albuterol  as  needed. Follow the dosing schedule for Zepbound  as prescribed. If you experience any side effects or have any concerns, please contact our office.   Orders: PFTs  Follow-up 3 months with 1 hour PFT prior

## 2024-09-11 NOTE — Progress Notes (Signed)
 @Patient  ID: Kyle Kennedy, male    DOB: Mar 02, 1971, 53 y.o.   MRN: [id number]  Chief Complaint  Patient presents with   Obstructive Sleep Apnea    Follow up cpap    Referring provider: Cleotilde Lukes, DO  HPI: 53 year old male, former light smoker (marijuana).  Past medical history significant for OSA.  Patient of Kyle Kennedy.  Previous [facility] pulmonary:  06/02/2022 Patient presents today to reestablish care for OSA. Patient is sleeping well at night.Compliant with CPAP use. Current pressure settings 5 to 15 cm H2O without residual apneas. No issues with mask fit or pressure setting. He uses full face mask. He needs supplies renewed through [employer].   Airview download 05/02/2022 - 05/31/2022 Usage days 30/30 days (100%); 30 days (100%) greater than 4 hours Average usage 9 hours 4 minutes Pressure 5-15 (11.6cm h20-95%) Air leaks 23.1L/min AHI 0.2   Sleep questionnaire Symptoms-  Hx sleep apnea Prior sleep study- June Bedtime- 9pm-11pm Time to fall asleep- varies  Nocturnal awakenings- 1-3 times Out of bed/start of day- 5:50am Weight changes- NA Do you operate heavy machinery- No Do you currently wear CPAP- Yes Do you current wear oxygen- No Epworth- 4   07/26/2023 Patient presents today for 1 year follow-up OSA.  Patient was last seen in August 2023 to reestablish care for OSA.  He remains compliant with CPAP.  Current pressure 5 to 15 cm H2O. Tolerating PAP therapy without any major issues. He does report improvement in sleep quality when wearing. He does not sleep in one position throughout the night. Once in awhile tubing will get tangled up. He gets CPAP supplies through [employer], needs order renewed.   He is dealing with seasonal allergies. Taking over the antihistamine.  He reports shortness of breath with activity off and on for the last several years He has gained weight since coming off ozempic   He has not had a CXR in several years  No active cough or sputum  production   Airview download 06/26/2023 - 07/25/2023 Usage days 26/30 days (87%) greater than 4 hours Average usage 8 hours 9 minutes Pressure 5 to 15 cm H2O (12.3 cm H2O-95%) Air leaks 23.8 L/min (95%) AHI 0.2  Discussed the use of AI scribe software for clinical note transcription with the patient, who gave verbal consent to proceed.  History of Present Illness Kyle Kennedy is a 53 year old male with sleep apnea who presents for a follow-up visit. He is accompanied by his wife.  He uses an auto CPAP machine with pressure settings of 5 to 15 and reports using it 100% of the time. Occasionally, the machine becomes unplugged or makes bubble noises if water gets in it, but he resolves this by blowing it out. He has not received any warnings about the machine's motor life being exceeded and has enough supplies on hand. He uses food-grade hydrogen peroxide in his CPAP water, which he dilutes to about 3% and uses at night. If he uses too much, it causes him to cough.  He experiences ongoing shortness of breath, especially with physical activity, and uses albuterol  as needed. He is a former smoker and has a history of marijuana use. A chest x-ray from last year showed clear lungs. There has been no improvement in his shortness of breath.  He has a history of obesity with a BMI that has fluctuated. He reports a weight of approximately 270 pounds at home, although a recent measurement was incorrectly recorded as 170  pounds. He was previously on Ozempic  for borderline diabetes, which helped with weight loss and controlling his A1c, but he has not been able to obtain it for a year due to coverage issues.  He experiences restless legs at night, which sometimes results in kicking during sleep. His wife notes that his legs 'go crazy kicking me and kicking the bed.'     No Known Allergies  Immunization History  Administered Date(s) Administered   Influenza, Quadrivalent, Recombinant, Inj,  Pf 07/09/2019   Influenza, Seasonal, Injecte, Preservative Fre 07/26/2023, 07/04/2024   Influenza,inj,Quad PF,6+ Mos 08/07/2013, 08/04/2017, 07/17/2018, 07/09/2021, 07/26/2022   Influenza-Unspecified 07/18/2015, 07/26/2023   MODERNA COVID-19 SARS-COV-2 PEDS BIVALENT BOOSTER 90yr-23yr 07/30/2021   Moderna Sars-Covid-2 Vaccination 12/19/2019, 01/16/2020, 07/11/2020, 01/25/2021   PNEUMOCOCCAL CONJUGATE-20 07/04/2024   Pfizer(Comirnaty)Fall Seasonal Vaccine 12 years and older 08/03/2022, 08/17/2024   Pneumococcal Conjugate-13 08/14/2020   Tdap 08/07/2013   Zoster Recombinant(Shingrix) 08/14/2020, 10/30/2020    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     albuterol  (VENTOLIN  HFA) 108 (90 Base) MCG/ACT inhaler INHALE 2 PUFFS INTO THE LUNGS EVERY 6 HOURS AS NEEDED FOR WHEEZING OR SHORTNESS OF BREATH 6.7 g 1   allopurinol  (ZYLOPRIM ) 100 MG tablet TAKE 1 TABLET BY MOUTH DAILY 90 tablet 3   amLODipine  (NORVASC ) 10 MG tablet Take 1 tablet (10 mg total) by mouth daily. 90 tablet 1    buPROPion  (WELLBUTRIN  XL) 150 MG 24 hr tablet Take 1 tablet (150 mg total) by mouth daily. 90 tablet 1   esomeprazole  (NEXIUM ) 40 MG capsule Take 1 capsule (40 mg total) by mouth 2 (two) times daily before a meal. 180 capsule 0   fluticasone  (FLONASE ) 50 MCG/ACT nasal spray Place 2 sprays into both nostrils daily. 48 g 1   gabapentin  (NEURONTIN ) 600 MG tablet Take 1.5 tablets (900 mg total) by mouth daily as needed. 90 tablet 3   levocetirizine (XYZAL ) 5 MG tablet TAKE 1 TABLET(5 MG) BY MOUTH EVERY EVENING 90 tablet 3   lisinopril  (ZESTRIL ) 20 MG tablet TAKE 1 TABLET(20 MG) BY MOUTH AT BEDTIME 90 tablet 1   loratadine  (CLARITIN  REDITABS) 10 MG dissolvable tablet Take 1 tablet (10 mg total) by mouth daily. As needed for allergy symptoms 120 tablet 0  metFORMIN  (GLUCOPHAGE -XR) 500 MG 24 hr tablet Take 1 tablet (500 mg total) by mouth daily with breakfast. 90 tablet 1   Multiple Vitamin (MULTI-VITAMIN DAILY PO) Take 1 tablet by mouth daily.     naproxen  (NAPROSYN ) 500 MG tablet Take 1 tablet (500 mg total) by mouth 2 (two) times daily as needed. 30 tablet 0   Omega-3 300 MG CAPS Take 300 mg by mouth daily.     OVER THE COUNTER MEDICATION Take 1 tablet by mouth daily. Visiclear Eye Vitamin     OVER THE COUNTER MEDICATION Take 2 capsules by mouth daily. [place] (poison oak prevention)     sildenafil  (VIAGRA ) 100 MG tablet Take 0.5-1 tablets (50-100 mg total) by mouth daily as needed for erectile dysfunction. Start with 50 mg and if you need more, can increase to 100 5 tablet 11   simvastatin  (ZOCOR ) 40 MG tablet TAKE 1 TABLET BY MOUTH DAILY 90 tablet 2   venlafaxine  (EFFEXOR ) 75 MG tablet TAKE 3 TABLETS(225 MG) BY MOUTH DAILY 180 tablet 3   Semaglutide ,0.25 or 0.5MG /DOS, (OZEMPIC , 0.25 OR 0.5 MG/DOSE,) 2 MG/3ML SOPN Inject 0.25 mg into the skin once a week. (Patient not taking: Reported on 09/11/2024) 3 mL 4   No facility-administered medications prior to visit.   Review of Systems  Review of  Systems  Constitutional: Negative.   Respiratory:  Positive for shortness of breath.    Physical Exam  BP 133/87   Pulse 65   Temp 98.3 F (36.8 C) (Oral)   Ht 5' 7 (1.702 m)   Wt 272 lb 6.4 oz (123.6 kg)   SpO2 95%   BMI 42.66 kg/m  Physical Exam Constitutional:      Appearance: Normal appearance. He is well-developed.  HENT:     Head: Normocephalic and atraumatic.     Mouth/Throat:     Mouth: Mucous membranes are moist.     Pharynx: Oropharynx is clear.  Cardiovascular:     Rate and Rhythm: Normal rate and regular rhythm.     Heart sounds: Normal heart sounds.  Pulmonary:     Effort: Pulmonary effort is normal. No respiratory distress.     Breath sounds: Normal breath sounds. No wheezing or rhonchi.  Musculoskeletal:        General: Normal range of motion.     Cervical back: Normal range of motion and neck supple.  Skin:    General: Skin is warm and dry.     Findings: No erythema or rash.  Neurological:     General: No focal deficit present.     Mental Status: He is alert and oriented to person, place, and time. Mental status is at baseline.  Psychiatric:        Mood and Affect: Mood normal.        Behavior: Behavior normal.        Thought Content: Thought content normal.        Judgment: Judgment normal.      Lab Results:  CBC    Component Value Date/Time   WBC 6.0 06/08/2022 1333   RBC 4.93 06/08/2022 1333   HGB 14.8 06/08/2022 1333   HGB 11.1 (L) 10/30/2013 0636   HCT 42.9 06/08/2022 1333   HCT 40.6 10/16/2013 1553   PLT 250 06/08/2022 1333   PLT 201 10/30/2013 0636   MCV 87.0 06/08/2022 1333   MCV 84 10/16/2013 1553   MCH 30.0 06/08/2022 1333   MCHC 34.5 06/08/2022 1333   RDW 12.8 06/08/2022  1333   RDW 13.1 10/16/2013 1553   LYMPHSABS 2.6 11/11/2010 1146   MONOABS 0.5 11/11/2010 1146   EOSABS 0.0 11/11/2010 1146   BASOSABS 0.1 11/11/2010 1146    BMET    Component Value Date/Time   NA 140 07/04/2024 1412   NA 140 10/30/2013 0636   K  4.2 07/04/2024 1412   K 3.9 10/30/2013 0636   CL 103 07/04/2024 1412   CL 107 10/30/2013 0636   CO2 21 07/04/2024 1412   CO2 29 10/30/2013 0636   GLUCOSE 125 (H) 07/04/2024 1412   GLUCOSE 107 (H) 06/08/2022 1333   GLUCOSE 126 (H) 10/30/2013 0636   BUN 12 07/04/2024 1412   BUN 9 10/30/2013 0636   CREATININE 0.85 07/04/2024 1412   CREATININE 0.89 12/29/2015 1510   CALCIUM 9.7 07/04/2024 1412   CALCIUM 8.7 10/30/2013 0636   GFRNONAA >60 06/08/2022 1333   GFRNONAA >89 12/29/2015 1510   GFRAA 95 12/29/2017 0851   GFRAA >89 12/29/2015 1510    BNP No results found for: BNP  ProBNP No results found for: PROBNP  Imaging: No results found.   Assessment & Plan:   1. OSA (obstructive sleep apnea) (Primary)  Assessment and Plan Assessment & Plan Obstructive sleep apnea due to obesity Obstructive sleep apnea is well controlled with auto CPAP, with pressure settings of 5 to 15 cm H2O. He uses the CPAP 100% of the time. There are occasional issues with the CPAP machine unplugging and water accumulation causing bubble noises. He uses food-grade 3% hydrogen peroxide in the CPAP water chamber to prevent bacterial growth. He is interested in weight loss medications to aid in managing obesity and sleep apnea. Zepbound , a GLP-1 receptor agonist, is considered for its FDA approval for obesity and sleep apnea due to obesity. Potential side effects include nausea, vomiting, and diarrhea. No history of thyroid cancer, which is a contraindication for Zepbound . - Continue auto CPAP with current settings. - Prescribed Zepbound  starting at 2.5 mg once weekly, increasing to 5 mg after one month, then 7.5 mg after another month, with a target dose between 10 and 15 mg. - Ordered pulmonary function test to assess lung function. - Continue albuterol  as needed for shortness of breath.  Patient has moderate to severe sleep apnea related to obesity with BMI >/30, posing significant cardiovascular risks.  Patient has failed traditional weight loss measures with caloric deficit and consistent exercise of 150 min/week for >/6 months. Patient will be initiated on Zepbound  (tirzepatide ) for weight management. Zepbound  is the only pharmaceutical treatment approved for moderate-to-severe OSA in adults who are overweight (BMI >/27) or obese (BMI >/30). The patient will continue lifestyle modifications, including structured nutrition and physical activity as directed. No other GLP1 therapy will be used simultaneously at this time. The patient does not have any FDA labeled contraindications to this agent, including pregnancy, lactation, hx or family history of medullary thyroid cancer, or multiple endocrine neoplasia type II. Side effect profile has been reviewed with patient. Aware of red flag symptoms to notify of immediately or seek emergency care, including severe nausea/vomiting, inability to pass bowels or gas, severe abdominal pain/tenderness, jaundice.    Shortness of breath Persists despite CPAP use. He reports no improvement in shortness of breath and uses albuterol  as needed. Previous chest x-ray showed clear lungs. No current smoking history, but former smoker and marijuana user. A pulmonary function test is planned to further evaluate lung function. - Ordered pulmonary function test to assess lung function. -  Continue albuterol  as needed for shortness of breath.  Prediabetes Previously managed with Ozempic , which was effective in reducing weight and A1c levels. Coverage issues have prevented continued use of Ozempic . Zepbound  is considered as an alternative for weight loss and glycemic control. - Prescribed Zepbound  as outlined under obstructive sleep apnea due to obesity.     Almarie LELON Ferrari, NP 09/16/2024

## 2024-09-11 NOTE — Telephone Encounter (Signed)
*  Pulm  Pharmacy Patient Advocate Encounter   Received notification from Pt Calls Messages that prior authorization for Zepbound  2.5mg  is required/requested.   Insurance verification completed.   The patient is insured through [facility].   Per test claim: PA required; PA submitted to above mentioned insurance via Latent Key/confirmation #/EOC [id number] Status is pending

## 2024-09-16 ENCOUNTER — Telehealth: Payer: Self-pay

## 2024-09-16 ENCOUNTER — Other Ambulatory Visit: Payer: Self-pay | Admitting: Family Medicine

## 2024-09-16 NOTE — Telephone Encounter (Signed)
 PA received from [facility] regarding pt's Zepbound  2.5MG / 0.5ML INJ (4PF PENS)   Rx number- [id number]             QTY- 2       Last refill- 09-11-24  SIG- Administer 2.5 MG under the skin 1 time a week    [facility] - [address]     [place] [id number]  P- [phone]     f- [phone]

## 2024-09-16 NOTE — Telephone Encounter (Signed)
 Your request has been approved Request Reference Number: [id number]. ZEPBOUND  INJ 2.5/0.5 is approved through 10/16/2025. Your patient may now fill this prescription and it will be covered. Authorization Expiration12/31/2026

## 2024-09-27 ENCOUNTER — Other Ambulatory Visit: Payer: Self-pay

## 2024-09-27 DIAGNOSIS — I10 Essential (primary) hypertension: Secondary | ICD-10-CM

## 2024-09-27 MED ORDER — SIMVASTATIN 40 MG PO TABS: 40.0000 mg | ORAL_TABLET | Freq: Every day | ORAL | 2 refills | Status: AC

## 2024-09-27 NOTE — Addendum Note (Signed)
 Addended by: Patina Spanier on: 09/27/2024 03:48 PM   Modules accepted: Orders

## 2024-09-28 MED ORDER — LISINOPRIL 20 MG PO TABS: ORAL_TABLET | ORAL | 1 refills | Status: AC

## 2024-10-12 ENCOUNTER — Other Ambulatory Visit: Payer: Self-pay | Admitting: Family Medicine

## 2024-10-12 DIAGNOSIS — E88819 Insulin resistance, unspecified: Secondary | ICD-10-CM

## 2024-10-12 DIAGNOSIS — E8881 Metabolic syndrome: Secondary | ICD-10-CM

## 2024-10-29 ENCOUNTER — Other Ambulatory Visit: Payer: Self-pay | Admitting: Family Medicine

## 2024-10-29 DIAGNOSIS — F32A Depression, unspecified: Secondary | ICD-10-CM

## 2024-11-05 ENCOUNTER — Other Ambulatory Visit: Payer: Self-pay | Admitting: Primary Care

## 2024-11-15 ENCOUNTER — Telehealth: Payer: Self-pay

## 2024-11-15 MED ORDER — ZEPBOUND 10 MG/0.5ML ~~LOC~~ SOAJ: 10.0000 mg | SUBCUTANEOUS | 1 refills | Status: AC

## 2024-11-15 MED ORDER — TIRZEPATIDE-WEIGHT MANAGEMENT 7.5 MG/0.5ML ~~LOC~~ SOLN: 7.5000 mg | SUBCUTANEOUS | 1 refills | Status: AC

## 2024-11-15 NOTE — Telephone Encounter (Signed)
 Please advise zepbound  refill and dosage

## 2024-11-15 NOTE — Telephone Encounter (Signed)
 I called patient to confirm he was tolerating 5mg  dose but went to voice mail. Can triage please call and verify he is taking 5mg  and not having any issues?   I will send in refill for Zepbound  dose 7.5mg  [place] weekly. I will also send in 10mg  dose for pharmacy to have on file to start the following month if tolerating.   He needs to see me back in 8 weeks

## 2024-11-18 NOTE — Telephone Encounter (Signed)
 I have already sent in RX for 7.5mg  x 4 weeks and 10mg  to start after if tolerating increased dose. Needs fu in 4-6 weeks

## 2024-11-18 NOTE — Telephone Encounter (Signed)
 Spoke with pt he has done well with the 5mg  and finished it with no issues

## 2024-11-19 NOTE — Telephone Encounter (Signed)
 Called pt and there was no answer I have sent him msg via mychart that rx was sent and to keep planned f/u 12/12/24

## 2024-11-21 ENCOUNTER — Telehealth: Payer: Self-pay

## 2024-11-21 NOTE — Telephone Encounter (Signed)
 Copied from CRM [id number]. Topic: Clinical - Prescription Issue >> Nov 21, 2024  1:05 PM Rozanna MATSU wrote: Reason for CRM: Elberta with [facility] calling about prescriptions E.Hope sent for pt (Zepbound  and tirzepatide  7.5) the pt is there to pick up . Called CAL the young lady that answered stated she would get a message to [employer]. I did advise the pt was there to pick up the prescription.  Done

## 2024-11-21 NOTE — Telephone Encounter (Signed)
 I called [facility] pharmacy regarding a missed call for the pt's Zepbound  Prescription. Per Almarie Ferrari, NP in a mychart message,   I have already sent in RX for 7.5mg  x 4 weeks and 10mg  to start after if tolerating increased dose. Needs fu in 4-6 weeks   I spoke to the pharmacist, Rochuan and informed him of Beth's note. He verbalized understanding. NFN

## 2024-12-12 ENCOUNTER — Ambulatory Visit: Admitting: Primary Care

## 2024-12-12 ENCOUNTER — Encounter
